# Patient Record
Sex: Female | Born: 1990
Health system: Southern US, Community
[De-identification: ages and names within clinical notes are randomized; demographics above are authoritative.]

## PROBLEM LIST (undated history)

## (undated) DIAGNOSIS — E079 Disorder of thyroid, unspecified: Secondary | ICD-10-CM

## (undated) DIAGNOSIS — D219 Benign neoplasm of connective and other soft tissue, unspecified: Secondary | ICD-10-CM

## (undated) DIAGNOSIS — E119 Type 2 diabetes mellitus without complications: Secondary | ICD-10-CM

## (undated) DIAGNOSIS — D259 Leiomyoma of uterus, unspecified: Secondary | ICD-10-CM

## (undated) DIAGNOSIS — D649 Anemia, unspecified: Secondary | ICD-10-CM

## (undated) DIAGNOSIS — E669 Obesity, unspecified: Secondary | ICD-10-CM

## (undated) DIAGNOSIS — Z87898 Personal history of other specified conditions: Secondary | ICD-10-CM

## (undated) DIAGNOSIS — E039 Hypothyroidism, unspecified: Secondary | ICD-10-CM

## (undated) DIAGNOSIS — R87629 Unspecified abnormal cytological findings in specimens from vagina: Secondary | ICD-10-CM

## (undated) DIAGNOSIS — E559 Vitamin D deficiency, unspecified: Secondary | ICD-10-CM

## (undated) DIAGNOSIS — E282 Polycystic ovarian syndrome: Secondary | ICD-10-CM

## (undated) DIAGNOSIS — J45909 Unspecified asthma, uncomplicated: Secondary | ICD-10-CM

## (undated) DIAGNOSIS — K219 Gastro-esophageal reflux disease without esophagitis: Secondary | ICD-10-CM

## (undated) DIAGNOSIS — R160 Hepatomegaly, not elsewhere classified: Secondary | ICD-10-CM

## (undated) HISTORY — DX: Benign neoplasm of connective and other soft tissue, unspecified: D21.9

## (undated) HISTORY — DX: Polycystic ovarian syndrome: E28.2

## (undated) HISTORY — PX: OTHER SURGICAL HISTORY: SHX169

## (undated) HISTORY — DX: Anemia, unspecified: D64.9

## (undated) HISTORY — DX: Unspecified abnormal cytological findings in specimens from vagina: R87.629

## (undated) HISTORY — DX: Leiomyoma of uterus, unspecified: D25.9

## (undated) HISTORY — DX: Hypothyroidism, unspecified: E03.9

## (undated) HISTORY — DX: Type 2 diabetes mellitus without complications: E11.9

## (undated) HISTORY — DX: Vitamin D deficiency, unspecified: E55.9

## (undated) HISTORY — DX: Personal history of other specified conditions: Z87.898

## (undated) HISTORY — DX: Obesity, unspecified: E66.9

## (undated) HISTORY — DX: Disorder of thyroid, unspecified: E07.9

---
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
## 2012-09-22 DIAGNOSIS — E8881 Metabolic syndrome: Secondary | ICD-10-CM | POA: Insufficient documentation

## 2012-09-22 DIAGNOSIS — E88819 Insulin resistance, unspecified: Secondary | ICD-10-CM | POA: Insufficient documentation

## 2018-02-04 ENCOUNTER — Ambulatory Visit (INDEPENDENT_AMBULATORY_CARE_PROVIDER_SITE_OTHER): Payer: Managed Care, Other (non HMO) | Admitting: Internal Medicine

## 2018-02-04 ENCOUNTER — Encounter: Payer: Self-pay | Admitting: Internal Medicine

## 2018-02-04 VITALS — BP 112/68 | HR 82 | Ht 62.5 in | Wt 303.6 lb

## 2018-02-04 DIAGNOSIS — N924 Excessive bleeding in the premenopausal period: Secondary | ICD-10-CM

## 2018-02-04 DIAGNOSIS — E039 Hypothyroidism, unspecified: Secondary | ICD-10-CM

## 2018-02-04 DIAGNOSIS — R7303 Prediabetes: Secondary | ICD-10-CM | POA: Insufficient documentation

## 2018-02-04 HISTORY — DX: Excessive bleeding in the premenopausal period: N92.4

## 2018-02-04 NOTE — Patient Instructions (Signed)
Please stop at the lab.  Stay off Levothyroxine and Metformin.  If we need to start Levothyroxine, take it every day, with water, at least 30 minutes before breakfast, separated by at least 4 hours from: - acid reflux medications - calcium - iron - multivitamins   Please come back for a follow-up appointment in 6 months.

## 2018-02-04 NOTE — Progress Notes (Signed)
Patient ID: Christine Nelson, female   DOB: Nov 18, 1990, 27 y.o.   MRN: [id number]    HPI  Christine Nelson is a 27 y.o.-year-old female, referred by her ObGyn , Dr. Anderson Malta A. Mock, for management of hypothyroidism and prediabetes.  Pt. has been dx with hypothyroidism in 2013 >> was on Levothyroxine 150 mcg >> stopped 2018.  She would like to avoid taking medications if possible.  She was taking the thyroid hormone: - fasting - fasting - separated by >30 min from b'fast  - no calcium, iron, PPIs, multivitamins   + Biotin x 1 tablet few days ago  I reviewed pt's thyroid tests: 05/03/2015: TSH 0.743, free T4 1.68 No results found for: TSH, FREET4, T3FREE  Pt describes: - fatigue - especially with her cycles  - occasional cold intolerance  But otherwise denies:  - Weight gain - depression - constipation - dry skin - hair loss  Pt denies feeling nodules in neck, hoarseness, dysphagia/odynophagia, SOB with lying down.  She has + FH of thyroid disorders in: F. No FH of thyroid cancer.  No h/o radiation tx to head or neck.  No recent use of iodine supplements.  Prediabetes:  - was on Metformin >> stopped - now exercises 5/7 days - started 2 weeks ago  Meals: - Breakfast: skips - Lunch: salad - Dinner: snack (PB crackers, grapes) or salad or wrap - Snacks: PB crackers or fruit   ROS: Constitutional: + See HPI Eyes: no blurry vision, no xerophthalmia ENT: no sore throat,  + see HPI Cardiovascular: no CP/SOB/palpitations/leg swelling Respiratory: no cough/SOB Gastrointestinal: no N/V/D/C Musculoskeletal: no muscle/joint aches Skin: no rashes Neurological: no tremors/numbness/tingling/dizziness Psychiatric: no depression/anxiety  Past Medical History:  Diagnosis Date  . Thyroid condition    Social History   Socioeconomic History  . Marital status: Single    Spouse name: Not on file  . Number of children: 0  . Years of education: Not on file  . Highest education  level: Not on file  Occupational History  . Occupation: Editor, commissioning at [employer]  . Smoking status: Never Smoker  . Smokeless tobacco: Never Used  Substance and Sexual Activity  . Alcohol use: Not on file  . Drug use: Not on file  . Sexual activity: Not on file   Current Outpatient Medications on File Prior to Visit  Medication Sig Dispense Refill  .    .    . SPRINTEC 28 0.25-35 MG-MCG tablet Take 1 tablet by mouth daily.     No current facility-administered medications on file prior to visit.     NKDA  Pertinent family history: See HPI  PE: BP 112/68   Pulse 82   Ht 5' 2.5" (1.588 m)   Wt (!) 303 lb 9.6 oz (137.7 kg)   SpO2 98%   BMI 54.64 kg/m  Wt Readings from Last 3 Encounters:  02/04/18 (!) 303 lb 9.6 oz (137.7 kg)   Constitutional: overweight, in NAD Eyes: PERRLA, EOMI, no exophthalmos ENT: moist mucous membranes, no thyromegaly, no cervical lymphadenopathy Cardiovascular: RRR, No MRG Respiratory: CTA B Gastrointestinal: abdomen soft, NT, ND, BS+ Musculoskeletal: no deformities, strength intact in all 4 Skin: moist, warm, no rashes Neurological: no tremor with outstretched hands, DTR normal in all 4  ASSESSMENT: 1. Hypothyroidism  2. Prediabetes  3. Menorrhagia  PLAN:  1. Patient with long-standing reportedly mild hypothyroidism, previously on levothyroxine therapy.  She stopped levothyroxine at some point last year and she would prefer not to  restart, if possible. - she appears euthyroid.  - she does not appear to have a goiter, thyroid nodules, or neck compression symptoms - We discussed about correct intake of levothyroxine, if we need to restart, fasting, with water, separated by at least 30 minutes from breakfast, and separated by more than 4 hours from calcium, iron, multivitamins, acid reflux medications (PPIs). - will check thyroid tests today: TSH, free T4, free T3, and will also screen her for Hashimoto's thyroiditis by taking TPO  and ATA antibodies - We discussed about Hashimoto's thyroiditis as being an autoimmune condition that eventually destroys the thyroid and renders it inactive. - If labs today are abnormal, she will need to return in ~6 weeks for repeat labs - Otherwise, I will see her back in 6 months  2.  Prediabetes - No recent HbA1c, will check one today - She started to implement 5 out of 7-day exercise with a trainer I congratulated her and strongly advised her to continue  - We also discussed about healthy diet and I made suggestions about changing her meals  - we also discussed about the reversible nature of prediabetes if she continues to make healthy changes in her diet and exercise - We do not need metformin necessarily, unless there is an upward trend in her HbA1c  3.  Menorrhagia - She describes heavy menstrual cycles with large clots - She has increased fatigue during her menses - Would want me to check her for anemia: We will check a CBC and a ferritin  Component     Latest Ref Rng & Units 02/04/2018  WBC     3.4 - 10.8 x10E3/uL 7.7  RBC     3.77 - 5.28 x10E6/uL 4.08  Hemoglobin     11.1 - 15.9 g/dL 11.3  HCT     34.0 - 46.6 % 34.7  MCV     79 - 97 fL 85  MCH     26.6 - 33.0 pg 27.7  MCHC     31.5 - 35.7 g/dL 32.6  RDW     12.3 - 15.4 % 14.6  Platelets     150 - 379 x10E3/uL 333  Hemoglobin A1C     4.8 - 5.6 % 5.5  Est. average glucose Bld gHb Est-mCnc     mg/dL 111  TSH     0.450 - 4.500 uIU/mL 6.960 (H)  T4,Free(Direct)     0.82 - 1.77 ng/dL 1.09  Triiodothyronine,Free,Serum     2.0 - 4.4 pg/mL 2.7  Ferritin     15 - 150 ng/mL 17  Thyroglobulin Antibody     0.0 - 0.9 IU/mL <1.0  Thyroperoxidase Ab SerPl-aCnc     0 - 34 IU/mL 10   CBC normal, but ferritin at the low end of normal.  Would suggest to start iron once a day for breakfast. Thyroid antibodies not elevated.  No signs of Hashimoto's thyroiditis. TSH slightly high with normal free thyroid hormones.  Based  on her desire about not starting levothyroxine unless absolutely necessary and based on the guidelines (need to restart if overt hypothyroidism, otherwise, only if desires pregnancy, if TSH is higher than 10, or if clear signs and symptoms re indicative of hypothyroidism).  We will recheck her TFTs in 6 months.  Philemon Kingdom, MD PhD [facility] Endocrinology

## 2018-02-05 LAB — CBC
HEMOGLOBIN: 11.3 g/dL (ref 11.1–15.9)
Hematocrit: 34.7 % (ref 34.0–46.6)
MCH: 27.7 pg (ref 26.6–33.0)
MCHC: 32.6 g/dL (ref 31.5–35.7)
MCV: 85 fL (ref 79–97)
Platelets: 333 10*3/uL (ref 150–379)
RBC: 4.08 x10E6/uL (ref 3.77–5.28)
RDW: 14.6 % (ref 12.3–15.4)
WBC: 7.7 10*3/uL (ref 3.4–10.8)

## 2018-02-05 LAB — THYROID PEROXIDASE ANTIBODY: THYROID PEROXIDASE ANTIBODY: 10 [IU]/mL (ref 0–34)

## 2018-02-05 LAB — FERRITIN: Ferritin: 17 ng/mL (ref 15–150)

## 2018-02-05 LAB — TSH: TSH: 6.96 u[IU]/mL — ABNORMAL HIGH (ref 0.450–4.500)

## 2018-02-05 LAB — T3, FREE: T3, Free: 2.7 pg/mL (ref 2.0–4.4)

## 2018-02-05 LAB — T4, FREE: Free T4: 1.09 ng/dL (ref 0.82–1.77)

## 2018-02-05 LAB — HEMOGLOBIN A1C
Est. average glucose Bld gHb Est-mCnc: 111 mg/dL
Hgb A1c MFr Bld: 5.5 % (ref 4.8–5.6)

## 2018-02-05 LAB — THYROGLOBULIN ANTIBODY

## 2018-02-08 ENCOUNTER — Telehealth: Payer: Self-pay

## 2018-02-08 NOTE — Telephone Encounter (Signed)
-----   Message from Philemon Kingdom, MD sent at 02/08/2018  9:03 AM EDT ----- Larey Seat, can you please call pt:  CBC normal (no anemia), but ferritin at the low end of normal.  Would suggest to start an iron supplement once a day for breakfast, although not mandatory if she increases her intake of green leafy vegetables.  If she does start iron, this is over-the-counter. Thyroid antibodies not elevated, therefore no signs of Hashimoto's thyroiditis. TSH is slightly high but her free thyroid hormones are normal.  We do not absolutely need to start levothyroxine for now.  We will recheck her TFTs in 6 months.

## 2018-08-08 ENCOUNTER — Ambulatory Visit: Payer: Managed Care, Other (non HMO) | Admitting: Internal Medicine

## 2018-08-08 DIAGNOSIS — Z0289 Encounter for other administrative examinations: Secondary | ICD-10-CM

## 2019-03-08 ENCOUNTER — Ambulatory Visit: Payer: Self-pay | Admitting: Family Medicine

## 2019-03-15 ENCOUNTER — Encounter: Payer: Self-pay | Admitting: Family Medicine

## 2019-03-15 ENCOUNTER — Other Ambulatory Visit: Payer: Self-pay

## 2019-03-15 ENCOUNTER — Ambulatory Visit: Payer: BC Managed Care – PPO | Admitting: Family Medicine

## 2019-03-15 VITALS — BP 110/64 | HR 86 | Temp 97.8°F | Wt 319.0 lb

## 2019-03-15 DIAGNOSIS — N926 Irregular menstruation, unspecified: Secondary | ICD-10-CM | POA: Diagnosis not present

## 2019-03-15 DIAGNOSIS — R5383 Other fatigue: Secondary | ICD-10-CM | POA: Diagnosis not present

## 2019-03-15 DIAGNOSIS — Z8639 Personal history of other endocrine, nutritional and metabolic disease: Secondary | ICD-10-CM | POA: Insufficient documentation

## 2019-03-15 DIAGNOSIS — Z7689 Persons encountering health services in other specified circumstances: Secondary | ICD-10-CM | POA: Diagnosis not present

## 2019-03-15 DIAGNOSIS — Z6841 Body Mass Index (BMI) 40.0 and over, adult: Secondary | ICD-10-CM

## 2019-03-15 LAB — CBC WITH DIFFERENTIAL/PLATELET
Basophils Absolute: 0 10*3/uL (ref 0.0–0.1)
Basophils Relative: 0.3 % (ref 0.0–3.0)
Eosinophils Absolute: 0.1 10*3/uL (ref 0.0–0.7)
Eosinophils Relative: 1.2 % (ref 0.0–5.0)
HCT: 36 % (ref 36.0–46.0)
Hemoglobin: 11.5 g/dL — ABNORMAL LOW (ref 12.0–15.0)
Lymphocytes Relative: 30.9 % (ref 12.0–46.0)
Lymphs Abs: 2.6 10*3/uL (ref 0.7–4.0)
MCHC: 31.9 g/dL (ref 30.0–36.0)
MCV: 83.1 fl (ref 78.0–100.0)
Monocytes Absolute: 0.3 10*3/uL (ref 0.1–1.0)
Monocytes Relative: 3.6 % (ref 3.0–12.0)
Neutro Abs: 5.5 10*3/uL (ref 1.4–7.7)
Neutrophils Relative %: 64 % (ref 43.0–77.0)
Platelets: 358 10*3/uL (ref 150.0–400.0)
RBC: 4.34 Mil/uL (ref 3.87–5.11)
RDW: 16.2 % — ABNORMAL HIGH (ref 11.5–15.5)
WBC: 8.5 10*3/uL (ref 4.0–10.5)

## 2019-03-15 LAB — BASIC METABOLIC PANEL
BUN: 9 mg/dL (ref 6–23)
CO2: 28 mEq/L (ref 19–32)
Calcium: 9 mg/dL (ref 8.4–10.5)
Chloride: 102 mEq/L (ref 96–112)
Creatinine, Ser: 0.86 mg/dL (ref 0.40–1.20)
GFR: 96.71 mL/min (ref >60.00)
Glucose, Bld: 90 mg/dL (ref 70–99)
Potassium: 4.4 mEq/L (ref 3.5–5.1)
Sodium: 138 mEq/L (ref 135–145)

## 2019-03-15 LAB — T4, FREE: Free T4: 0.69 ng/dL (ref 0.60–1.60)

## 2019-03-15 LAB — HEMOGLOBIN A1C: Hgb A1c MFr Bld: 6.8 % — ABNORMAL HIGH (ref 4.6–6.5)

## 2019-03-15 LAB — VITAMIN D 25 HYDROXY (VIT D DEFICIENCY, FRACTURES): VITD: 20.62 ng/mL — ABNORMAL LOW (ref 30.00–100.00)

## 2019-03-15 LAB — TSH: TSH: 13.68 u[IU]/mL — ABNORMAL HIGH (ref 0.35–4.50)

## 2019-03-15 LAB — POCT URINE PREGNANCY: Preg Test, Ur: NEGATIVE

## 2019-03-15 NOTE — Progress Notes (Signed)
Patient presents to clinic today to establish care.  SUBJECTIVE: PMH:  Pt is a 28 yo female with pmh sig for h/o hypothyroidism and obesity.  Pt previously seen in [place], [place].  Seen by OB/Gyn in April 2020.  H/o thyroid issues: -in the past pt seen by Endocrinology -told had hypothyroidism, put on levothyroxine and metformin -has since seen another Endocrinologist and taken off meds as "levels were normal". -pt denies constipation, diarrhea, palpitations, hair loss -endorses weight gain, fatigue/not feeling rested  Irregular menses: -pt typically has regular menses -on Sprintec OCPs -states last menses was shorter in duration, 3 days as opposed to 5 days -pt mentioned this to her OB/Gyn.  Advised to take a home pregnancy test, which was negative. -pt endorses being sexually active in April. -pt does not think she is pregnant, but wants a test today to be sure. -endorses feet and ankle swelling near her menses.  Weight gain -not exercising.   -may eat one meal per day. -trying to drink more water -endorses her dad is overweight.  Allergies:  NKDA  Past Surgical hx: none  Social hx: Pt is single.  She has a boyfriend.  Pt graduated college with a degree in Biology.  Pt has always wanted to become a physician.  Pt was teaching Biology.  She is now working part-time at [employer].  Pt denies EtOH, tobacco, and drug use.  Health Maintenance: PAP -- April 2020   Family medical hx: Mom-htn Dad-obesity MGF-stroke, HTN Brother- AAW Brother- AAW  Past Medical History:  Diagnosis Date  . Thyroid condition     History reviewed. No pertinent surgical history.  Current Outpatient Medications on File Prior to Visit  Medication Sig Dispense Refill  . SPRINTEC 28 0.25-35 MG-MCG tablet Take 1 tablet by mouth daily.    . metFORMIN (GLUCOPHAGE-XR) 500 MG 24 hr tablet Take 1 tablet by mouth daily with breakfast.     No current facility-administered medications on  file prior to visit.     No Known Allergies  Family History  Problem Relation Age of Onset  . Hypertension Mother     Social History   Socioeconomic History  . Marital status: Single    Spouse name: Not on file  . Number of children: Not on file  . Years of education: Not on file  . Highest education level: Not on file  Occupational History  . Occupation: Editor, commissioning  Social Needs  . Financial resource strain: Not on file  . Food insecurity:    Worry: Not on file    Inability: Not on file  . Transportation needs:    Medical: Not on file    Non-medical: Not on file  Tobacco Use  . Smoking status: Never Smoker  . Smokeless tobacco: Never Used  Substance and Sexual Activity  . Alcohol use: Never    Frequency: Never  . Drug use: Never  . Sexual activity: Yes  Lifestyle  . Physical activity:    Days per week: Not on file    Minutes per session: Not on file  . Stress: Not on file  Relationships  . Social connections:    Talks on phone: Not on file    Gets together: Not on file    Attends religious service: Not on file    Active member of club or organization: Not on file    Attends meetings of clubs or organizations: Not on file    Relationship status: Not on file  .  Intimate partner violence:    Fear of current or ex partner: Not on file    Emotionally abused: Not on file    Physically abused: Not on file    Forced sexual activity: Not on file  Other Topics Concern  . Not on file  Social History Narrative  . Not on file    ROS General: Denies fever, chills, night sweats, changes in weight, changes in appetite  +fatigue, wt gain HEENT: Denies headaches, ear pain, changes in vision, rhinorrhea, sore throat CV: Denies CP, palpitations, SOB, orthopnea Pulm: Denies SOB, cough, wheezing GI: Denies abdominal pain, nausea, vomiting, diarrhea, constipation GU: Denies dysuria, hematuria, frequency, vaginal discharge  +change in menses Msk: Denies muscle cramps,  joint pains Neuro: Denies weakness, numbness, tingling Skin: Denies rashes, bruising Psych: Denies depression, anxiety, hallucinations   BP 110/64   Pulse 86   Temp 97.8 F (36.6 C) (Oral)   Wt (!) 319 lb (144.7 kg)   LMP 02/27/2019 (Exact Date)   SpO2 98%   BMI 57.42 kg/m   Physical Exam Gen. Pleasant, well developed, obese, well-nourished, in NAD HEENT - [place]/AT, PERRL, no scleral icterus, no nasal drainage, pharynx without erythema or exudate. Neck: No JVD, no thyromegaly, no carotid bruits Lungs: no use of accessory muscles, CTAB, no wheezes, rales or rhonchi Cardiovascular: heart sounds distant 2/2 body habitus, RRR, No r/g/m, no peripheral edema Abdomen: BS present, soft, nontender,nondistended Neuro:  A&Ox3, CN II-XII intact, normal gait Skin:  Warm, dry, intact, no lesions.  Acanthosis nigricans   No results found for this or any previous visit (from the past 2160 hour(s)).  Assessment/Plan: History of hypothyroidism  -not currently on meds - Plan: TSH, T4, Free  Fatigue, unspecified type  - Plan: CBC with Differential/Platelet, Basic metabolic panel, Hemoglobin A1c, Vitamin D, 25-hydroxy  Encounter to establish care -We reviewed the PMH, PSH, FH, SH, Meds and Allergies. -We provided refills for any medications we will prescribe as needed. -We addressed current concerns per orders and patient instructions. -We have asked for records for pertinent exams, studies, vaccines and notes from previous providers. -We have advised patient to follow up per instructions below.  Menses, irregular  -menses occurred but shorter in duration -continue OCPs -continue f/u with OB/Gyn - Plan: POCT urine pregnancy  Class III severe obesity due to excess calories without serious comorbidity with BMI 50-59.9 -BMI 53.4 -Discussed weight loss -Patient to increase physical activity, eat several small meals per day -Given handout -We will check TSH  F/u prn  Grier Mitts, MD

## 2019-03-15 NOTE — Patient Instructions (Signed)
Preventing Unhealthy Weight Gain, Adult Staying at a healthy weight is important to your overall health. When fat builds up in your body, you may become overweight or obese. Being overweight or obese increases your risk of developing certain health problems, such as heart disease, diabetes, sleeping problems, joint problems, and some types of cancer. Unhealthy weight gain is often the result of making unhealthy food choices or not getting enough exercise. You can make changes to your lifestyle to prevent obesity and stay as healthy as possible. What nutrition changes can be made?   Eat only as much as your body needs. To do this: ? Pay attention to signs that you are hungry or full. Stop eating as soon as you feel full. ? If you feel hungry, try drinking water first before eating. Drink enough water so your urine is clear or pale yellow. ? Eat smaller portions. Pay attention to portion sizes when eating out. ? Look at serving sizes on food labels. Most foods contain more than one serving per container. ? Eat the recommended number of calories for your gender and activity level. For most active people, a daily total of 2,000 calories is appropriate. If you are trying to lose weight or are not very active, you may need to eat fewer calories. Talk with your health care provider or a diet and nutrition specialist (dietitian) about how many calories you need each day.  Choose healthy foods, such as: ? Fruits and vegetables. At each meal, try to fill at least half of your plate with fruits and vegetables. ? Whole grains, such as whole-wheat bread, brown rice, and quinoa. ? Lean meats, such as chicken or fish. ? Other healthy proteins, such as beans, eggs, or tofu. ? Healthy fats, such as nuts, seeds, fatty fish, and olive oil. ? Low-fat or fat-free dairy products.  Check food labels, and avoid food and drinks that: ? Are high in calories. ? Have added sugar. ? Are high in sodium. ? Have saturated  fats or trans fats.  Cook foods in healthier ways, such as by baking, broiling, or grilling.  Make a meal plan for the week, and shop with a grocery list to help you stay on track with your purchases. Try to avoid going to the grocery store when you are hungry.  When grocery shopping, try to shop around the outside of the store first, where the fresh foods are. Doing this helps you to avoid prepackaged foods, which can be high in sugar, salt (sodium), and fat. What lifestyle changes can be made?   Exercise for 30 or more minutes on 5 or more days each week. Exercising may include brisk walking, yard work, biking, running, swimming, and team sports like basketball and soccer. Ask your health care provider which exercises are safe for you.  Do muscle-strengthening activities, such as lifting weights or using resistance bands, on 2 or more days a week.  Do not use any products that contain nicotine or tobacco, such as cigarettes and e-cigarettes. If you need help quitting, ask your health care provider.  Limit alcohol intake to no more than 1 drink a day for nonpregnant women and 2 drinks a day for men. One drink equals 12 oz of beer, 5 oz of wine, or 1 oz of hard liquor.  Try to get 7-9 hours of sleep each night. What other changes can be made?  Keep a food and activity journal to keep track of: ? What you ate and how many calories  you had. Remember to count the calories in sauces, dressings, and side dishes. ? Whether you were active, and what exercises you did. ? Your calorie, weight, and activity goals.  Check your weight regularly. Track any changes. If you notice you have gained weight, make changes to your diet or activity routine.  Avoid taking weight-loss medicines or supplements. Talk to your health care provider before starting any new medicine or supplement.  Talk to your health care provider before trying any new diet or exercise plan. Why are these changes important?  Eating healthy, staying active, and having healthy habits can help you to prevent obesity. Those changes also:  Help you manage stress and emotions.  Help you connect with friends and family.  Improve your self-esteem.  Improve your sleep.  Prevent long-term health problems. What can happen if changes are not made? Being obese or overweight can cause you to develop joint or bone problems, which can make it hard for you to stay active or do activities you enjoy. Being obese or overweight also puts stress on your heart and lungs and can lead to health problems like diabetes, heart disease, and some cancers. Where to find more information Talk with your health care provider or a dietitian about healthy eating and healthy lifestyle choices. You may also find information from:  [place]. Department of Agriculture, MyPlate: FormerBoss.no  American Heart Association: www.heart.org  Centers for Disease Control and Prevention: http://www.wolf.info/ Summary  Staying at a healthy weight is important to your overall health. It helps you to prevent certain diseases and health problems, such as heart disease, diabetes, joint problems, sleep disorders, and some types of cancer.  Being obese or overweight can cause you to develop joint or bone problems, which can make it hard for you to stay active or do activities you enjoy.  You can prevent unhealthy weight gain by eating a healthy diet, exercising regularly, not smoking, limiting alcohol, and getting enough sleep.  Talk with your health care provider or a dietitian for guidance about healthy eating and healthy lifestyle choices. This information is not intended to replace advice given to you by your health care provider. Make sure you discuss any questions you have with your health care provider. Document Released: 09/22/2016 Document Revised: 07/02/2017 Document Reviewed: 10/28/2016 Elsevier Interactive Patient Education  2019 [place] is a normal reaction to life events. Stress is what you feel when life demands more than you are used to, or more than you think you can handle. Some stress can be useful, such as studying for a test or meeting a deadline at work. Stress that occurs too often or for too long can cause problems. It can affect your emotional health and interfere with relationships and normal daily activities. Too much stress can weaken your body's defense system (immune system) and increase your risk for physical illness. If you already have a medical problem, stress can make it worse. What are the causes? All sorts of life events can cause stress. An event that causes stress for one person may not be stressful for another person. Major life events, whether positive or negative, commonly cause stress. Examples include:  Losing a job or starting a new job.  Losing a loved one.  Moving to a new town or home.  Getting married or divorced.  Having a baby.  Injury or illness. Less obvious life events can also cause stress, especially if they occur day after day or in combination with each  other. Examples include:  Working long hours.  Driving in traffic.  Caring for children.  Being in debt.  Being in a difficult relationship. What are the signs or symptoms? Stress can cause emotional symptoms, including:  Anxiety. This is feeling worried, afraid, on edge, overwhelmed, or out of control.  Anger, including irritation or impatience.  Depression. This is feeling sad, down, helpless, or guilty.  Trouble focusing, remembering, or making decisions. Stress can cause physical symptoms, including:  Aches and pains. These may affect your head, neck, back, stomach, or other areas of your body.  Tight muscles or a clenched jaw.  Low energy.  Trouble sleeping. Stress can cause unhealthy behaviors, including:  Eating to feel better (overeating) or skipping meals.  Working too much or putting off  tasks.  Smoking, drinking alcohol, or using drugs to feel better. How is this diagnosed? Stress is diagnosed through an assessment by your health care provider. He or she may diagnose this condition based on:  Your symptoms and any stressful life events.  Your medical history.  Tests to rule out other causes of your symptoms. Depending on your condition, your health care provider may refer you to a specialist for further evaluation. How is this treated?  Stress management techniques are the recommended treatment for stress. Medicine is not typically recommended for the treatment of stress. Techniques to reduce your reaction to stressful life events include:  Stress identification. Monitor yourself for symptoms of stress and identify what causes stress for you. These skills may help you to avoid or prepare for stressful events.  Time management. Set your priorities, keep a calendar of events, and learn to say "no." Taking these actions can help you avoid making too many commitments. Techniques for coping with stress include:  Rethinking the problem. Try to think realistically about stressful events rather than ignoring them or overreacting. Try to find the positives in a stressful situation rather than focusing on the negatives.  Exercise. Physical exercise can release both physical and emotional tension. The key is to find a form of exercise that you enjoy and do it regularly.  Relaxation techniques. These relax the body and mind. The key is to find one or more that you enjoy and use the technique(s) regularly. Examples include: ? Meditation, deep breathing, or progressive relaxation techniques. ? Yoga or tai chi. ? Biofeedback, mindfulness techniques, or journaling. ? Listening to music, being out in nature, or participating in other hobbies.  Practicing a healthy lifestyle. Eat a balanced diet, drink plenty of water, limit or avoid caffeine, and get plenty of sleep.  Having a  strong support network. Spend time with family, friends, or other people you enjoy being around. Express your feelings and talk things over with someone you trust. Counseling or talk therapy with a mental health professional may be helpful if you are having trouble managing stress on your own. Follow these instructions at home: Lifestyle   Avoid drugs.  Do not use any products that contain nicotine or tobacco, such as cigarettes and e-cigarettes. If you need help quitting, ask your health care provider.  Limit alcohol intake to no more than 1 drink a day for nonpregnant women and 2 drinks a day for men. One drink equals 12 oz of beer, 5 oz of wine, or 1 oz of hard liquor.  Do not use alcohol or drugs to relax.  Eat a balanced diet that includes fresh fruits and vegetables, whole grains, lean meats, fish, eggs, and beans, and low-fat  dairy. Avoid processed foods and foods high in added fat, sugar, and salt.  Exercise at least 30 minutes on 5 or more days each week.  Get 7-8 hours of sleep each night. General instructions   Practice stress management techniques as discussed with your health care provider.  Drink enough fluid to keep your urine clear or pale yellow.  Take over-the-counter and prescription medicines only as told by your health care provider.  Keep all follow-up visits as told by your health care provider. This is important. Contact a health care provider if:  Your symptoms get worse.  You have new symptoms.  You feel overwhelmed by your problems and can no longer manage them on your own. Get help right away if:  You have thoughts of hurting yourself or others. If you ever feel like you may hurt yourself or others, or have thoughts about taking your own life, get help right away. You can go to your nearest emergency department or call:  Your local emergency services (911 in the [place]  A suicide crisis helpline, such as the [facility]  at 989-233-8694. This is open 24 hours a day. Summary  Stress is a normal reaction to life events. It can cause problems if it happens too often or for too long.  Practicing stress management techniques is the best way to treat stress.  Counseling or talk therapy with a mental health professional may be helpful if you are having trouble managing stress on your own. This information is not intended to replace advice given to you by your health care provider. Make sure you discuss any questions you have with your health care provider. Document Released: 03/17/2001 Document Revised: 11/11/2016 Document Reviewed: 11/11/2016 Elsevier Interactive Patient Education  2019 [employer].

## 2019-03-17 ENCOUNTER — Other Ambulatory Visit: Payer: Self-pay | Admitting: Family Medicine

## 2019-03-17 DIAGNOSIS — E559 Vitamin D deficiency, unspecified: Secondary | ICD-10-CM

## 2019-03-17 MED ORDER — VITAMIN D (ERGOCALCIFEROL) 1.25 MG (50000 UNIT) PO CAPS: 50000.0000 [IU] | ORAL_CAPSULE | ORAL | 0 refills | Status: DC

## 2019-03-23 ENCOUNTER — Telehealth: Payer: Self-pay

## 2019-03-23 NOTE — Telephone Encounter (Signed)
Called pt left a VM to call office for her lab results    Copied from [place] [id number]. Topic: Quick Communication - Lab Results (Clinic Use ONLY) >> Mar 20, 2019  1:02 PM Lennox Solders wrote: Pt is calling and would like blood work results

## 2019-04-21 DIAGNOSIS — E282 Polycystic ovarian syndrome: Secondary | ICD-10-CM | POA: Insufficient documentation

## 2019-05-31 ENCOUNTER — Other Ambulatory Visit: Payer: Self-pay | Admitting: Obstetrics and Gynecology

## 2019-05-31 DIAGNOSIS — D259 Leiomyoma of uterus, unspecified: Secondary | ICD-10-CM

## 2019-06-07 ENCOUNTER — Other Ambulatory Visit: Payer: Self-pay | Admitting: Family Medicine

## 2019-06-07 DIAGNOSIS — E559 Vitamin D deficiency, unspecified: Secondary | ICD-10-CM

## 2019-06-15 ENCOUNTER — Ambulatory Visit: Payer: BC Managed Care – PPO | Admitting: Registered"

## 2019-06-27 ENCOUNTER — Other Ambulatory Visit: Payer: BC Managed Care – PPO

## 2019-07-27 ENCOUNTER — Other Ambulatory Visit: Payer: Self-pay

## 2019-07-27 ENCOUNTER — Inpatient Hospital Stay: Admission: RE | Admit: 2019-07-27 | Payer: Self-pay | Source: Ambulatory Visit

## 2019-08-19 ENCOUNTER — Other Ambulatory Visit: Payer: Self-pay

## 2019-08-19 ENCOUNTER — Ambulatory Visit
Admission: RE | Admit: 2019-08-19 | Discharge: 2019-08-19 | Disposition: A | Discharge status: Home / Self Care | Payer: Managed Care, Other (non HMO) | Source: Ambulatory Visit | Attending: Obstetrics and Gynecology | Admitting: Obstetrics and Gynecology

## 2019-08-19 DIAGNOSIS — D259 Leiomyoma of uterus, unspecified: Secondary | ICD-10-CM

## 2019-08-19 MED ORDER — GADOBENATE DIMEGLUMINE 529 MG/ML IV SOLN
20.0000 mL | Freq: Once | INTRAVENOUS | Status: AC | PRN
  Administered 2019-08-19: 20 mL via INTRAVENOUS

## 2019-08-24 ENCOUNTER — Telehealth: Payer: Self-pay | Admitting: *Deleted

## 2019-08-24 ENCOUNTER — Other Ambulatory Visit: Payer: Self-pay

## 2019-08-24 ENCOUNTER — Encounter: Payer: Self-pay | Admitting: Gynecologic Oncology

## 2019-08-24 NOTE — Telephone Encounter (Signed)
Patient called back and scheduled an appt for tomorrow

## 2019-08-24 NOTE — Progress Notes (Signed)
GYNECOLOGIC ONCOLOGY NEW PATIENT CONSULTATION   Patient Name: Christine Nelson  Patient Age: 28 y.o. Date of Service: 08/25/19 Referring Provider: Billie Ruddy, [address] [place],  [place] [address]   Primary Care Provider: Billie Ruddy, MD Consulting Provider: Jeral Pinch, MD   Assessment/Plan:  28 year old G0 with a large intrauterine mass suspected to be a degenerating fibroid.  We reviewed her recent MRI findings and looked at the pictures together.  Based on our radiologist review as well as my own, the mass appears to be arising from with in the lining of the uterus or submucosa.  My suspicion is that this is a degenerating fibroid although we discussed the limitation of imaging in the sense that we cannot rule out malignancy definitively.  The patient is at increased risk for uterine cancer given her body habitus and unopposed estrogen.  I commended her on her weight loss to date and encouraged her to continue her healthy eating habits and exercise.  This will make a significant difference in her risk for endometrial cancer in the future.  We discussed the common ways that uterine cancer presents and reviewed that in premenopausal women, patients may develop abnormal bleeding or intermenstrual bleeding.  Given the improvement of her intermenstrual bleeding with her reinitiation of thyroid replacement, I suspect that this will continue to improve and ultimately stop in the near future.  The patient very strongly desires fertility and is interested in a myomectomy.  Given the size of the fibroid, I suspect that her primary gynecologist was planning open versus robotic myomectomy.  We reviewed the increased surgical risk with her weight and that I suspect the recommendation will be to delay surgery for a number of months until further weight loss is achieved.    In the interim, I would suggest 1 of 2 strategies.  I would either pursue biopsy of the intrauterine  mass as well as lining of the uterus with a hysteroscopic procedure or a repeat MRI in 3-4 months.  We discussed the difficulty of comparing transvaginal ultrasound results to an MRI.  It also may be that the fibroid has changed in size secondary to its degeneration.  We discussed endometrial biopsy today however I think that given the location of the intrauterine mass, endometrial biopsy in clinic will be very difficult if not impossible.  The patient is scheduled for follow-up with Dr. Royston Sinner in mid November as well as to meet with a nutritionist.  A copy of this note was sent to the patient's referring provider.   Jeral Pinch, MD  Division of Gynecologic Oncology  Department of Obstetrics and Gynecology  [facility]  ___________________________________________  Chief Complaint: Chief Complaint  Patient presents with  . Uterine leiomyoma, unspecified location    History of Present Illness:  Christine Nelson is a 28 y.o. y.o. female who is seen in consultation at the request of Billie Ruddy, MD for an evaluation of a uterine mass.  Patient reports menarche at age 33 with a history of irregular bleeding.  She has been previously diagnosed with polycystic ovarian syndrome.  Her medical history is also notable for hypothyroidism.  She was recently off thyroid replacement, and began having some intermenstrual bleeding and spotting.  Since she restarted her levothyroxine, she has had significant decrease in intermenstrual bleeding.  On OCPs, she has monthly menses now.  She notes bad cramping with pain on the left side of her abdomen with her menses which  she remembers started in December last year.  Otherwise, she denies any abdominal or pelvic pain.  She denies nausea or vomiting.  She reports having a good appetite although has been working on weight loss recently.  She has lost about 10 pounds so far and is looking forward to her meeting next month with the  nutritionist.  She reports normal bowel and bladder function.  Earlier this year, given desire to become pregnant, she saw a gynecologist.  At that time she had a office ultrasound in July showing a 7 cm.  More recently, she had an MRI done showing an enlarged uterus measuring 17 cm with a 10 x 7 x 8 cm degenerating mass likely within the endometrial cavity.  PAST MEDICAL HISTORY:  Past Medical History:  Diagnosis Date  . Diabetes mellitus, type II ([place])    Hgb A1c in 03/2019 was 6.8%  . Hypothyroid   . Obesity   . Uterine leiomyoma      PAST SURGICAL HISTORY:  History reviewed. No pertinent surgical history.  OB/GYN HISTORY:  OB History  Gravida Para Term Preterm AB Living  0 0 0 0 0 0  SAB TAB Ectopic Multiple Live Births  0 0 0 0 0   OCP use, currently sexually active Menarche at 28y.o. Hx of STDs: HPV Last pap: 01/2019 History of abnormal pap smears: yes, LSIL pap 04/2016, HPV+; underwent colposcopy with poor Lugol's uptake at 12 and 6:00.  Biopsies taken from each location and an ECC was performed.  Biopsy showed CIN-1 and ECC was negative for dysplasia or malignancy. Pap tests have been normal since.  SCREENING STUDIES:  Last mammogram: n/a  Last colonoscopy: n/a Last bone mineral density: n/a  MEDICATIONS: Outpatient Encounter Medications as of 08/25/2019  Medication Sig  . ELDERBERRY PO Take 1 tablet by mouth daily.  Marland Kitchen levothyroxine (SYNTHROID) 25 MCG tablet levothyroxine 25 mcg tablet  TAKE 1 TABLET BY MOUTH EVERY DAY  . Multiple Vitamin (MULTIVITAMIN) tablet Take 1 tablet by mouth daily.  . SPRINTEC 28 0.25-35 MG-MCG tablet Take 1 tablet by mouth daily.  . vitamin C (ASCORBIC ACID) 500 MG tablet Take 500 mg by mouth daily.  . [DISCONTINUED] albuterol (PROAIR HFA) 108 (90 Base) MCG/ACT inhaler ProAir HFA 90 mcg/actuation aerosol inhaler  INHALE 1 TO 2 PUFFS EVERY 4 TO 6 HOURS AS NEEDED  . [DISCONTINUED] fluticasone (FLONASE) 50 MCG/ACT nasal spray fluticasone  propionate 50 mcg/actuation nasal spray,suspension  SPRAY 2 SPRAYS INTO EACH NOSTRIL EVERY DAY  . [DISCONTINUED] metFORMIN (GLUCOPHAGE-XR) 500 MG 24 hr tablet Take 1 tablet by mouth daily with breakfast.  . [DISCONTINUED] Vitamin D, Ergocalciferol, (DRISDOL) 1.25 MG (50000 UT) CAPS capsule Take 1 capsule (50,000 Units total) by mouth every 7 (seven) days.   No facility-administered encounter medications on file as of 08/25/2019.     ALLERGIES:  No Known Allergies   FAMILY HISTORY:  Family History  Problem Relation Age of Onset  . Hypertension Mother   . Thyroid disease Father   . Hypertension Maternal Grandfather   . Stroke Maternal Grandfather      Relationships  Social connections  . Talks on phone: Not on file  . Gets together: Not on file  . Attends religious service: Not on file  . Active member of club or organization: Not on file  . Attends meetings of clubs or organizations: Not on file  . Relationship status: Not on file    REVIEW OF SYSTEMS:  Denies appetite changes, fevers, chills,  fatigue, unexplained weight changes. Denies hearing loss, neck lumps or masses, mouth sores, ringing in ears or voice changes. Denies cough or wheezing. Denies chest pain or palpitations. Denies leg swelling. Denies abdominal distention, pain, blood in stools, constipation, diarrhea, nausea, vomiting, or early satiety. Denies pain with intercourse, dysuria, frequency, hematuria or incontinence. Denies hot flashes, pelvic pain, vaginal bleeding or vaginal discharge.  Denies joint pain, back pain or muscle pain/cramps. Denies itching, rash, or wounds. Denies dizziness, headaches, numbness or seizures. Denies swollen lymph nodes or glands, denies easy bruising or bleeding. Denies anxiety, depression, confusion, or decreased concentration.  Physical Exam:  Vital Signs for this encounter:  Blood pressure 128/72, pulse 88, temperature 98.7 F (37.1 C), temperature source Temporal, resp.  rate 20, height 5\' 3"  (1.6 m), weight (!) 315 lb (142.9 kg), SpO2 100 %. Body mass index is 55.8 kg/m. General: Alert, oriented, no acute distress.  HEENT: Normocephalic, atraumatic. Sclera anicteric.  Chest: Clear to auscultation bilaterally.  Breath sounds distant.  No wheezes or rhonchi. Cardiovascular: Regular rate and rhythm, no murmurs, rubs, or gallops.  Abdomen: Obese. Normoactive bowel sounds. Soft, nondistended, nontender to palpation. No masses or hepatosplenomegaly appreciated. No palpable fluid wave.  Extremities: Grossly normal range of motion. Warm, well perfused. No edema bilaterally.  Skin: No rashes or lesions.  Lymphatics: No cervical, supraclavicular, or inguinal adenopathy.  GU:  Normal external female genitalia.  No lesions. No discharge or bleeding.             Bladder/urethra:  No lesions or masses, well supported bladder             Vagina: Rugated vaginal mucosa, no lesions.             Cervix: Normal appearing, no lesions.  Somewhat posterior facing.             Uterus: 15 cm and somewhat mobile.  Fundus palpated between pubic symphysis and umbilicus, although somewhat difficult due to body habitus.             Adnexa: no masses appreciated.  Rectal: Deferred.  LABORATORY AND RADIOLOGIC DATA:  Outside medical records were reviewed to synthesize the above history, along with the history and physical obtained during the visit.   MRI pelvis 08/19/19: IMPRESSION: 1. Large ovoid mass in the uterus with appearances signal characteristics that likely represent a large degenerating leiomyoma. However, the endometrium cannot be identified and on some images this appears to be centered within the endometrial canal. Hysteroscopic assessment may be helpful to exclude an endometrial neoplasm. Comparison with previous imaging would also be useful. 2. Normal appendix. 3. No pelvic lymphadenopathy. 4. No signs of ureteral dilation. 5. Mildly enlarged inguinal nodes, likely  reactive.

## 2019-08-25 ENCOUNTER — Other Ambulatory Visit: Payer: Self-pay

## 2019-08-25 ENCOUNTER — Inpatient Hospital Stay: Payer: Managed Care, Other (non HMO) | Attending: Gynecologic Oncology | Admitting: Gynecologic Oncology

## 2019-08-25 ENCOUNTER — Encounter: Payer: Self-pay | Admitting: Gynecologic Oncology

## 2019-08-25 VITALS — BP 128/72 | HR 88 | Temp 98.7°F | Resp 20 | Ht 63.0 in | Wt 315.0 lb

## 2019-08-25 DIAGNOSIS — D259 Leiomyoma of uterus, unspecified: Secondary | ICD-10-CM | POA: Insufficient documentation

## 2019-08-25 NOTE — Patient Instructions (Addendum)
It was a pleasure meeting you today.  I will send my recommendations to Dr. Royston Sinner.  Congratulations on your weight loss to date!  We discussed continued weight loss today to make a procedure to remove the uterine fibroid (myomectomy) more feasible and safe for you.  We also discussed her increased risk factors for endometrial cancer, notably your weight.  I suspect that your intermenstrual bleeding is related to your thyroid disease.  You have already noticed some improvement in intermenstrual bleeding since restarting her levothyroxine.    I recommend 1 of 2 options for surveillance until you are ready to undergo myomectomy.  Either, I would recommend repeat MRI imaging (so we can compare the same imaging modality), at least 3 to 4 months after the one you just had: Or, doing outpatient surgery to biopsy the uterine mass as well as your endometrial or uterine lining.  Overall though, my suspicion that this is a cancer (malignant) is very low.  If you have any questions or need anything in the future, please do not hesitate to call our office at [phone].

## 2019-08-25 NOTE — Telephone Encounter (Signed)
error 

## 2019-09-14 ENCOUNTER — Telehealth: Payer: Managed Care, Other (non HMO) | Admitting: Family Medicine

## 2019-09-15 ENCOUNTER — Telehealth: Payer: Self-pay | Admitting: *Deleted

## 2019-09-15 NOTE — Telephone Encounter (Signed)
Copied from [place] [id number]. Topic: General - Inquiry >> Sep 14, 2019  5:53 PM [name] wrote: Reason for CRM: Patient called in she was suppose to have a telephone appt today with Dr Volanda Napoleon and the call never came through . Please advise

## 2019-09-15 NOTE — Telephone Encounter (Signed)
Pt has been scheduled for 09/18/2019 at 10.30 am with Dr Volanda Napoleon

## 2019-09-18 ENCOUNTER — Telehealth (INDEPENDENT_AMBULATORY_CARE_PROVIDER_SITE_OTHER): Payer: Managed Care, Other (non HMO) | Admitting: Family Medicine

## 2019-09-18 DIAGNOSIS — R05 Cough: Secondary | ICD-10-CM

## 2019-09-18 DIAGNOSIS — J302 Other seasonal allergic rhinitis: Secondary | ICD-10-CM | POA: Diagnosis not present

## 2019-09-18 DIAGNOSIS — R059 Cough, unspecified: Secondary | ICD-10-CM

## 2019-09-18 MED ORDER — LEVOCETIRIZINE DIHYDROCHLORIDE 5 MG PO TABS: 5.0000 mg | ORAL_TABLET | Freq: Every evening | ORAL | 1 refills | Status: DC

## 2019-09-18 MED ORDER — FLUTICASONE PROPIONATE 50 MCG/ACT NA SUSP: 1.0000 | Freq: Every day | NASAL | 1 refills | Status: DC

## 2019-09-18 NOTE — Progress Notes (Signed)
Virtual Visit via Video Note  I connected with Christine Nelson on 09/18/19 at 10:30 AM EST by a video enabled telemedicine application 2/2 [id number] pandemic and verified that I am speaking with the correct person using two identifiers.  Location patient: home Location provider:work or home office Persons participating in the virtual visit: patient, provider  I discussed the limitations of evaluation and management by telemedicine and the availability of in person appointments. The patient expressed understanding and agreed to proceed.   HPI: Pt is a 28 yo female with pmh sig for Hypothyroidism, pre DM, obesity.  Pt states her allergies are "out of control" x a few wks.  Pt with post-nasal drainage and a somewhat productive cough.  Seen at [facility] in the past, given an inhaler and Z-pak.  Started using the inhaler, which helps some.  Taking generic loratidine daily,  At times benadryl, and warm tea with honey that helps some.  Denies SOB, wheezing.  Pt tried zyrtec in the past but felt like it stopped working as well.  Pt inquires if she needs a z-pak.  ROS: See pertinent positives and negatives per HPI.  Past Medical History:  Diagnosis Date  . Diabetes mellitus, type II ([place])    Hgb A1c in 03/2019 was 6.8%  . Hypothyroid   . Obesity   . Uterine leiomyoma     No past surgical history on file.  Family History  Problem Relation Age of Onset  . Hypertension Mother   . Thyroid disease Father   . Hypertension Maternal Grandfather   . Stroke Maternal Grandfather      Current Outpatient Medications:  .  ELDERBERRY PO, Take 1 tablet by mouth daily., Disp: , Rfl:  .  levothyroxine (SYNTHROID) 25 MCG tablet, levothyroxine 25 mcg tablet  TAKE 1 TABLET BY MOUTH EVERY DAY, Disp: , Rfl:  .  Multiple Vitamin (MULTIVITAMIN) tablet, Take 1 tablet by mouth daily., Disp: , Rfl:  .  SPRINTEC 28 0.25-35 MG-MCG tablet, Take 1 tablet by mouth daily., Disp: , Rfl:  .  vitamin C (ASCORBIC ACID) 500 MG tablet,  Take 500 mg by mouth daily., Disp: , Rfl:   EXAM:  VITALS per patient if applicable: RR between 123456 bpm  GENERAL: alert, oriented, appears well and in no acute distress  HEENT: atraumatic, conjunctiva clear, no obvious abnormalities on inspection of external nose and ears  NECK: normal movements of the head and neck  LUNGS: occasional cough, on inspection no signs of respiratory distress, breathing rate appears normal, no obvious gross SOB, gasping or wheezing  CV: no obvious cyanosis  MS: moves all visible extremities without noticeable abnormality  PSYCH/NEURO: pleasant and cooperative, no obvious depression or anxiety, speech and thought processing grossly intact  ASSESSMENT AND PLAN:  Discussed the following assessment and plan:  Seasonal allergies  -discussed supportive care.  Will change allergy med -discussed proper nasal spray use. - Plan: fluticasone (FLONASE) 50 MCG/ACT nasal spray, levocetirizine (XYZAL) 5 MG tablet -given precautions  Cough -likely 2/2 post nasal drainage from allergies -pt advised to limit inhaler use as symptoms less likely related to reactive airway/asthma -Pt advised abx not indicated at this time. -will start nasal spray and switch allergy medicine for better control. -for continued cough consider CXR. -given precautions  F/u in 1-2 wks for continued symptoms.   I discussed the assessment and treatment plan with the patient. The patient was provided an opportunity to ask questions and all were answered. The patient agreed with the plan  and demonstrated an understanding of the instructions.   The patient was advised to call back or seek an in-person evaluation if the symptoms worsen or if the condition fails to improve as anticipated.   Billie Ruddy, MD   This note is not being shared with the patient for the following reason: To prevent harm (release of this note would result in harm to the life or physical safety of the patient  or another).

## 2019-09-22 ENCOUNTER — Ambulatory Visit: Payer: Managed Care, Other (non HMO) | Admitting: Registered"

## 2019-09-22 NOTE — Progress Notes (Deleted)
A1c 02/2018 5.5%; 03/15/19 Volanda Napoleon, MD visit 6.8%; Nov 2020 Royston Sinner, MD visit 6.1% (low iron & vit D

## 2019-09-25 ENCOUNTER — Other Ambulatory Visit: Payer: Self-pay | Admitting: Obstetrics and Gynecology

## 2019-09-25 DIAGNOSIS — D259 Leiomyoma of uterus, unspecified: Secondary | ICD-10-CM

## 2019-10-10 DIAGNOSIS — E282 Polycystic ovarian syndrome: Secondary | ICD-10-CM | POA: Diagnosis not present

## 2019-10-10 DIAGNOSIS — E039 Hypothyroidism, unspecified: Secondary | ICD-10-CM | POA: Diagnosis not present

## 2019-10-10 DIAGNOSIS — N939 Abnormal uterine and vaginal bleeding, unspecified: Secondary | ICD-10-CM | POA: Diagnosis not present

## 2019-10-10 DIAGNOSIS — Z309 Encounter for contraceptive management, unspecified: Secondary | ICD-10-CM | POA: Diagnosis not present

## 2019-10-10 DIAGNOSIS — Z793 Long term (current) use of hormonal contraceptives: Secondary | ICD-10-CM | POA: Diagnosis not present

## 2019-10-10 DIAGNOSIS — D259 Leiomyoma of uterus, unspecified: Secondary | ICD-10-CM | POA: Diagnosis not present

## 2019-10-12 DIAGNOSIS — M9901 Segmental and somatic dysfunction of cervical region: Secondary | ICD-10-CM | POA: Diagnosis not present

## 2019-10-12 DIAGNOSIS — M9902 Segmental and somatic dysfunction of thoracic region: Secondary | ICD-10-CM | POA: Diagnosis not present

## 2019-10-12 DIAGNOSIS — M9903 Segmental and somatic dysfunction of lumbar region: Secondary | ICD-10-CM | POA: Diagnosis not present

## 2019-10-17 ENCOUNTER — Other Ambulatory Visit: Payer: Self-pay | Admitting: Family Medicine

## 2019-10-17 DIAGNOSIS — J302 Other seasonal allergic rhinitis: Secondary | ICD-10-CM

## 2019-10-18 DIAGNOSIS — F329 Major depressive disorder, single episode, unspecified: Secondary | ICD-10-CM | POA: Diagnosis not present

## 2019-10-18 DIAGNOSIS — F411 Generalized anxiety disorder: Secondary | ICD-10-CM | POA: Diagnosis not present

## 2019-11-08 DIAGNOSIS — F329 Major depressive disorder, single episode, unspecified: Secondary | ICD-10-CM | POA: Diagnosis not present

## 2019-11-08 DIAGNOSIS — F411 Generalized anxiety disorder: Secondary | ICD-10-CM | POA: Diagnosis not present

## 2019-11-22 ENCOUNTER — Other Ambulatory Visit: Payer: Self-pay | Admitting: Family Medicine

## 2019-11-22 DIAGNOSIS — J302 Other seasonal allergic rhinitis: Secondary | ICD-10-CM

## 2019-11-22 DIAGNOSIS — F329 Major depressive disorder, single episode, unspecified: Secondary | ICD-10-CM | POA: Diagnosis not present

## 2019-11-22 DIAGNOSIS — F411 Generalized anxiety disorder: Secondary | ICD-10-CM | POA: Diagnosis not present

## 2019-12-06 DIAGNOSIS — F329 Major depressive disorder, single episode, unspecified: Secondary | ICD-10-CM | POA: Diagnosis not present

## 2019-12-06 DIAGNOSIS — F411 Generalized anxiety disorder: Secondary | ICD-10-CM | POA: Diagnosis not present

## 2019-12-12 ENCOUNTER — Other Ambulatory Visit: Payer: Self-pay | Admitting: Family Medicine

## 2019-12-12 DIAGNOSIS — J302 Other seasonal allergic rhinitis: Secondary | ICD-10-CM

## 2019-12-21 DIAGNOSIS — F329 Major depressive disorder, single episode, unspecified: Secondary | ICD-10-CM | POA: Diagnosis not present

## 2019-12-21 DIAGNOSIS — F411 Generalized anxiety disorder: Secondary | ICD-10-CM | POA: Diagnosis not present

## 2020-01-04 DIAGNOSIS — F329 Major depressive disorder, single episode, unspecified: Secondary | ICD-10-CM | POA: Diagnosis not present

## 2020-01-04 DIAGNOSIS — F411 Generalized anxiety disorder: Secondary | ICD-10-CM | POA: Diagnosis not present

## 2020-01-10 DIAGNOSIS — F329 Major depressive disorder, single episode, unspecified: Secondary | ICD-10-CM | POA: Diagnosis not present

## 2020-01-10 DIAGNOSIS — F411 Generalized anxiety disorder: Secondary | ICD-10-CM | POA: Diagnosis not present

## 2020-01-25 DIAGNOSIS — F411 Generalized anxiety disorder: Secondary | ICD-10-CM | POA: Diagnosis not present

## 2020-01-25 DIAGNOSIS — F329 Major depressive disorder, single episode, unspecified: Secondary | ICD-10-CM | POA: Diagnosis not present

## 2020-02-19 DIAGNOSIS — F329 Major depressive disorder, single episode, unspecified: Secondary | ICD-10-CM | POA: Diagnosis not present

## 2020-02-19 DIAGNOSIS — F411 Generalized anxiety disorder: Secondary | ICD-10-CM | POA: Diagnosis not present

## 2020-03-05 DIAGNOSIS — F411 Generalized anxiety disorder: Secondary | ICD-10-CM | POA: Diagnosis not present

## 2020-03-05 DIAGNOSIS — F329 Major depressive disorder, single episode, unspecified: Secondary | ICD-10-CM | POA: Diagnosis not present

## 2020-04-03 DIAGNOSIS — F329 Major depressive disorder, single episode, unspecified: Secondary | ICD-10-CM | POA: Diagnosis not present

## 2020-04-11 ENCOUNTER — Encounter: Payer: Self-pay | Admitting: Internal Medicine

## 2020-04-11 ENCOUNTER — Ambulatory Visit: Payer: BC Managed Care – PPO | Admitting: Internal Medicine

## 2020-04-11 ENCOUNTER — Other Ambulatory Visit: Payer: Self-pay

## 2020-04-11 VITALS — BP 122/80 | HR 88 | Ht 63.0 in | Wt 330.0 lb

## 2020-04-11 DIAGNOSIS — Z113 Encounter for screening for infections with a predominantly sexual mode of transmission: Secondary | ICD-10-CM | POA: Diagnosis not present

## 2020-04-11 DIAGNOSIS — E039 Hypothyroidism, unspecified: Secondary | ICD-10-CM

## 2020-04-11 DIAGNOSIS — N760 Acute vaginitis: Secondary | ICD-10-CM | POA: Diagnosis not present

## 2020-04-11 DIAGNOSIS — D259 Leiomyoma of uterus, unspecified: Secondary | ICD-10-CM | POA: Diagnosis not present

## 2020-04-11 DIAGNOSIS — R7303 Prediabetes: Secondary | ICD-10-CM

## 2020-04-11 DIAGNOSIS — Z6841 Body Mass Index (BMI) 40.0 and over, adult: Secondary | ICD-10-CM | POA: Diagnosis not present

## 2020-04-11 DIAGNOSIS — E282 Polycystic ovarian syndrome: Secondary | ICD-10-CM | POA: Diagnosis not present

## 2020-04-11 DIAGNOSIS — Z01419 Encounter for gynecological examination (general) (routine) without abnormal findings: Secondary | ICD-10-CM | POA: Diagnosis not present

## 2020-04-11 LAB — HEMOGLOBIN A1C: Hgb A1c MFr Bld: 6.5 % (ref 4.6–6.5)

## 2020-04-11 LAB — TSH: TSH: 3.03 u[IU]/mL (ref 0.35–4.50)

## 2020-04-11 LAB — T4, FREE: Free T4: 1.15 ng/dL (ref 0.60–1.60)

## 2020-04-11 NOTE — Progress Notes (Signed)
Patient ID: Christine Nelson, female   DOB: 1990-10-29, 29 y.o.   MRN: [id number]   This visit occurred during the SARS-CoV-2 public health emergency.  Safety protocols were in place, including screening questions prior to the visit, additional usage of staff PPE, and extensive cleaning of exam room while observing appropriate contact time as indicated for disinfecting solutions.   HPI  Christine Nelson is a 29 y.o.-year-old female, initially referred by her ObGyn , Dr. Anderson Malta A. Mock, returning for follow-up for hypothyroidism and prediabetes.  Last visit 2 years and 2 months ago.  Hypothyroidism  - dx'ed in 2013 -She was on levothyroxine 150 mcg in the past but stopped in 2018 -She would like to stay off medications if possible  At last visit, in 02/2018, her TSH was only mildly above target so I advised her to return for repeat but we did not start levothyroxine then.  However, a year later she had another TSH which was high, is 13.68, of which I was not aware.  She was advised to start start levothyroxine.  Pt is on levothyroxine 125 mcg daily, taken: - misses 1-2 doses a week - in am - fasting - at least 30 min from b'fast - no Ca, Fe, PPIs - + MVI <4h later! - not on Biotin  Reviewed patient's TFTs: Lab Results  Component Value Date   TSH 3.03 04/11/2020   TSH 13.68 (H) 03/15/2019   TSH 6.960 (H) 02/04/2018   Lab Results  Component Value Date   FREET4 1.15 04/11/2020   FREET4 0.69 03/15/2019   FREET4 1.09 02/04/2018   Lab Results  Component Value Date   T3FREE 2.7 02/04/2018   05/03/2015: TSH 0.743, free T4 1.68  She continues to describe fatigue and occasional cold intolerance. These are stable. She also has difficulty losing weight.  However, she denies: - Constipation - Hair loss  Pt denies: - feeling nodules in neck - hoarseness - dysphagia - choking - SOB with lying down  She has + FH of thyroid disorders in: Father.  No family history of  thyroid cancer.  No history of radiation of head or neck.  No use of biotin, iodine supplements, or herbal medications.  She is not on steroids.  Prediabetes: -Previously on Metformin -At last visit, she was trying to exercise 5 out of 7 days  Since last visit, she had an HbA1c in the diabetic range in 03/2019.  I was not aware of this value.  Reviewed HbA1c levels: Lab Results  Component Value Date   HGBA1C 6.5 04/11/2020   HGBA1C 6.8 (H) 03/15/2019   HGBA1C 5.5 02/04/2018   Since last visit, she started iron due to decreased ferritin.  She sees OB/GYN for PCOS, uterine fibroid.  On OCPs.  Sees Dr Mearl Latin.  She would like to come off OCPs as she feels that they are hindering her weight loss.  ROS: Constitutional: + See HPI Eyes: no blurry vision, no xerophthalmia ENT: no sore throat, + see HPI Cardiovascular: no CP/no SOB/no palpitations/no leg swelling Respiratory: no cough/no SOB/no wheezing Gastrointestinal: no N/no V/no D/no C/no acid reflux Musculoskeletal: no muscle aches/no joint aches Skin: no rashes, no hair loss Neurological: no tremors/no numbness/no tingling/no dizziness  I reviewed pt's medications, allergies, PMH, social hx, family hx, and changes were documented in the history of present illness. Otherwise, unchanged from my initial visit note.  Past Medical History:  Diagnosis Date  . Diabetes mellitus, type II ([place])    Hgb  A1c in 03/2019 was 6.8%  . Hypothyroid   . Obesity   . Uterine leiomyoma    History reviewed. No pertinent surgical history. Social History   Socioeconomic History  . Marital status: Single    Spouse name: Not on file  . Number of children: Not on file  . Years of education: Not on file  . Highest education level: Not on file  Occupational History  . Occupation: Editor, commissioning  Tobacco Use  . Smoking status: Never Smoker  . Smokeless tobacco: Never Used  Vaping Use  . Vaping Use: Never used  Substance and Sexual Activity  .  Alcohol use: Never  . Drug use: Never  . Sexual activity: Yes  Other Topics Concern  . Not on file  Social History Narrative   Pt is single.  She has a boyfriend.  Pt graduated college with a degree in Biology.  Pt has always wanted to become a physician.  Pt was teaching Biology.  She is now working part-time at [employer].        Social Determinants of Health   Financial Resource Strain:   . Difficulty of Paying Living Expenses:   Food Insecurity:   . Worried About Charity fundraiser in the Last Year:   . Arboriculturist in the Last Year:   Transportation Needs:   . Film/video editor (Medical):   Marland Kitchen Lack of Transportation (Non-Medical):   Physical Activity:   . Days of Exercise per Week:   . Minutes of Exercise per Session:   Stress:   . Feeling of Stress :   Social Connections:   . Frequency of Communication with Friends and Family:   . Frequency of Social Gatherings with Friends and Family:   . Attends Religious Services:   . Active Member of Clubs or Organizations:   . Attends Archivist Meetings:   Marland Kitchen Marital Status:   Intimate Partner Violence:   . Fear of Current or Ex-Partner:   . Emotionally Abused:   Marland Kitchen Physically Abused:   . Sexually Abused:    Current Outpatient Medications on File Prior to Visit  Medication Sig Dispense Refill  . ELDERBERRY PO Take 1 tablet by mouth daily.    . fluticasone (FLONASE) 50 MCG/ACT nasal spray PLACE 1 SPRAY INTO BOTH NOSTRILS DAILY. 16 mL 1  . levocetirizine (XYZAL) 5 MG tablet TAKE 1 TABLET BY MOUTH EVERY DAY IN THE EVENING 90 tablet 0  . Multiple Vitamin (MULTIVITAMIN) tablet Take 1 tablet by mouth daily.    . SPRINTEC 28 0.25-35 MG-MCG tablet Take 1 tablet by mouth daily.    . vitamin C (ASCORBIC ACID) 500 MG tablet Take 500 mg by mouth daily.    Marland Kitchen levothyroxine (SYNTHROID) 125 MCG tablet Take 125 mcg by mouth daily.     No current facility-administered medications on file prior to visit.   No Known  Allergies Family History  Problem Relation Age of Onset  . Hypertension Mother   . Thyroid disease Father   . Hypertension Maternal Grandfather   . Stroke Maternal Grandfather   Pertinent family history: See HPI  PE: BP 122/80   Pulse 88   Ht 5\' 3"  (1.6 m)   Wt (!) 330 lb (149.7 kg)   SpO2 96%   BMI 58.46 kg/m  Wt Readings from Last 3 Encounters:  04/11/20 (!) 330 lb (149.7 kg)  08/25/19 (!) 315 lb (142.9 kg)  03/15/19 (!) 319 lb (144.7 kg)  Constitutional: overweight, in NAD Eyes: PERRLA, EOMI, no exophthalmos ENT: moist mucous membranes, no thyromegaly, no cervical lymphadenopathy Cardiovascular: RRR, No MRG Respiratory: CTA B Gastrointestinal: abdomen soft, NT, ND, BS+ Musculoskeletal: no deformities, strength intact in all 4 Skin: moist, warm, no rashes Neurological: no tremor with outstretched hands, DTR normal in all 4  ASSESSMENT: 1. Hypothyroidism  2. Prediabetes  PLAN:  1. Patient with longstanding mild hypothyroidism, previously on levothyroxine therapy, of which he came off in 2018.  At our last visit in 2019 her TSH was only slightly above the upper limit of normal, with normal free T4 and free T3 so I advised her that we do not absolutely need levothyroxine treatment at that time (unless she was contemplating a pregnancy - she was not) but I wanted her to return for repeat of her TFTs.  She had another TSH checked in 03/2019 and this was >10 (13) >>  she was started back on LT4 by OB/GYN -Of note, TPO and ATA antibodies were now positive in the past, so there is no evidence of Hashimoto's thyroiditis - latest thyroid labs reviewed with pt >> elevated: Lab Results  Component Value Date   TSH 13.68 (H) 03/15/2019   - currently on LT4 125 mcg daily - pt feels good on this dose but she still has difficulty losing weight - we discussed about taking the thyroid hormone every day, with water, >30 minutes before breakfast, separated by >4 hours from acid reflux  medications, calcium, iron, multivitamins. Pt. is not taking it correctly.  She misses doses (we discussed about strategies to improve her compliance), and she takes multivitamins too close to the thyroid hormone.  I advised her to move these at least 4 hours later. - will check thyroid tests today: TSH and fT4 - If labs are abnormal, she will need to return for repeat TFTs in 1.5 months -Otherwise, I will see her back in 6 months  2.  Prediabetes -No recent HbA1c, but the latest level was from 03/2019 and it was in the diabetic range, at 6.8%.  She had another HbA1c by OB/GYN and this was apparently lower, but I do not have these results. -We discussed about ranges of HbA1c for diagnosis of prediabetes and diabetes -Discussed that we will need another HbA1c to properly diagnose her with diabetes versus prediabetes -Discussed about the importance of healthy diet and weight loss to improve her glycemic control and reverse her prediabetes/diabetes -We will recheck another HbA1c today   Component     Latest Ref Rng & Units 04/11/2020  TSH     0.35 - 4.50 uIU/mL 3.03  T4,Free(Direct)     0.60 - 1.60 ng/dL 1.15  Hemoglobin A1C     4.6 - 6.5 % 6.5  TFTs are normal.  We will continue the current levothyroxine dose. HbA1c is elevated, giving her a diagnosis of diabetes, but it is in the low diabetic range, improved from 6.8%.   Philemon Kingdom, MD PhD [facility] Endocrinology

## 2020-04-11 NOTE — Patient Instructions (Signed)
Please stop at the lab.  Please continue Levothyroxine 125 mcg daily.  Take the thyroid hormone every day, with water, at least 30 minutes before breakfast, separated by at least 4 hours from: - acid reflux medications - calcium - iron - multivitamins  Please come back for a follow-up appointment in 6 months.

## 2020-04-12 ENCOUNTER — Other Ambulatory Visit: Payer: Self-pay | Admitting: Obstetrics and Gynecology

## 2020-04-12 DIAGNOSIS — D259 Leiomyoma of uterus, unspecified: Secondary | ICD-10-CM

## 2020-04-30 ENCOUNTER — Encounter (INDEPENDENT_AMBULATORY_CARE_PROVIDER_SITE_OTHER): Payer: Self-pay | Admitting: Family Medicine

## 2020-04-30 ENCOUNTER — Ambulatory Visit (INDEPENDENT_AMBULATORY_CARE_PROVIDER_SITE_OTHER): Payer: BC Managed Care – PPO | Admitting: Family Medicine

## 2020-04-30 ENCOUNTER — Other Ambulatory Visit: Payer: Self-pay

## 2020-04-30 VITALS — BP 110/75 | HR 85 | Temp 97.8°F | Ht 63.0 in | Wt 322.0 lb

## 2020-04-30 DIAGNOSIS — E559 Vitamin D deficiency, unspecified: Secondary | ICD-10-CM

## 2020-04-30 DIAGNOSIS — Z862 Personal history of diseases of the blood and blood-forming organs and certain disorders involving the immune mechanism: Secondary | ICD-10-CM

## 2020-04-30 DIAGNOSIS — E038 Other specified hypothyroidism: Secondary | ICD-10-CM

## 2020-04-30 DIAGNOSIS — R0602 Shortness of breath: Secondary | ICD-10-CM

## 2020-04-30 DIAGNOSIS — Z1331 Encounter for screening for depression: Secondary | ICD-10-CM | POA: Diagnosis not present

## 2020-04-30 DIAGNOSIS — Z0289 Encounter for other administrative examinations: Secondary | ICD-10-CM

## 2020-04-30 DIAGNOSIS — Z6841 Body Mass Index (BMI) 40.0 and over, adult: Secondary | ICD-10-CM

## 2020-04-30 DIAGNOSIS — R5383 Other fatigue: Secondary | ICD-10-CM | POA: Diagnosis not present

## 2020-04-30 DIAGNOSIS — Z9189 Other specified personal risk factors, not elsewhere classified: Secondary | ICD-10-CM

## 2020-04-30 DIAGNOSIS — E66813 Obesity, class 3: Secondary | ICD-10-CM

## 2020-04-30 DIAGNOSIS — E1169 Type 2 diabetes mellitus with other specified complication: Secondary | ICD-10-CM | POA: Diagnosis not present

## 2020-04-30 DIAGNOSIS — Z86018 Personal history of other benign neoplasm: Secondary | ICD-10-CM

## 2020-04-30 DIAGNOSIS — E282 Polycystic ovarian syndrome: Secondary | ICD-10-CM

## 2020-04-30 NOTE — Progress Notes (Signed)
Dear Dr. Royston Sinner,   Thank you for referring Christine Nelson to our clinic. The following note includes my evaluation and treatment recommendations.  Chief Complaint:   OBESITY Christine Nelson (MR# [id number]) is a 29 y.o. female who presents for evaluation and treatment of obesity and related comorbidities. Current BMI is Body mass index is 57.04 kg/m. Christine Nelson has been struggling with her weight for many years and has been unsuccessful in either losing weight, maintaining weight loss, or reaching her healthy weight goal.  Christine Nelson is currently in the action stage of change and ready to dedicate time achieving and maintaining a healthier weight. [name] is interested in becoming our patient and working on intensive lifestyle modifications including (but not limited to) diet and exercise for weight loss.  Christine Nelson is married and lives with her husband, Christine Nelson.  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  She says she does not crave any foods.  She dislikes Little Debbie snack cakes and only snacks on fruit.  She skips breakfast and lunch more often than not.  She drinks smoothies and sweet tea.  Christine Nelson's habits were reviewed today and are as follows: Her family eats meals together, she thinks her family will eat healthier with her, her desired weight loss is 130 pounds, she has been heavy most of her life, she started gaining weight in college, her heaviest weight ever was 330 pounds, she is sometimes a picky eater and doesn't like to eat healthier foods, she skips breakfast and/or lunch frequently and she is frequently drinking liquids with calories.  Depression Screen Christine Nelson's Food and Mood (modified PHQ-9) score was 8.  Depression screen PHQ 2/9 04/30/2020  Decreased Interest 1  Down, Depressed, Hopeless 2  PHQ - 2 Score 3  Altered sleeping 1  Tired, decreased energy 1  Change in appetite 1  Feeling bad or failure about yourself  1  Trouble  concentrating 1  Moving slowly or fidgety/restless 0  Suicidal thoughts 0  PHQ-9 Score 8  Difficult doing work/chores Not difficult at all   Subjective:   1. Other fatigue Christine Nelson denies daytime somnolence and reports waking up still tired. Patent has a history of symptoms of morning fatigue and snoring. Christine Nelson generally gets 6 hours of sleep per night, and states that she has generally restful sleep. Snoring is present. Apneic episodes are not present. Epworth Sleepiness Score is 3.  2. SOB (shortness of breath) on exertion Christine Nelson notes increasing shortness of breath with exercising and seems to be worsening over time with weight gain. She notes getting out of breath sooner with activity than she used to. This has not gotten worse recently. Christine Nelson denies shortness of breath at rest or orthopnea.  3. Type 2 diabetes mellitus with other specified complication, without long-term current use of insulin (HCC) Medications reviewed. Diabetic ROS: no polyuria or polydipsia, no chest pain, dyspnea or TIA's, no numbness, tingling or pain in extremities.  Christine Nelson's A1c recently was 6.5, and on 03/25/2019, it was 6.8.  She did not discuss her diabetes with her endocrinologist, she says.  She reports that she was never told she was a diabetic in the past; she was only told that she had prediabetes.  She was placed on metformin in the past for prediabetes and did not tolerate it well.  She declines medication.  Lab Results  Component Value Date   HGBA1C 6.5 04/11/2020   HGBA1C 6.8 (H) 03/15/2019   HGBA1C 5.5  02/04/2018   Lab Results  Component Value Date   LDLCALC 68 04/30/2020   CREATININE 0.75 04/30/2020   4. Other specified hypothyroidism Christine Nelson was recently started on levothyroxine 125 mcg daily.  She sees [name] for treatment.    Lab Results  Component Value Date   TSH 3.03 04/11/2020   5. PCOS (polycystic ovarian syndrome) Christine Nelson is followed by Dr. Royston Sinner of GYN for treatment of her  PCOS.  6. Vitamin D deficiency Keona's Vitamin D level was 20.62 on 03/15/2019. She is currently taking no vitamin D supplement. She denies nausea, vomiting or muscle weakness.  7. History of uterine fibroid Christine Nelson has a uterine fibroid and is treated by [name] in GYN.  8. History of iron deficiency anemia Christine Nelson is not a vegetarian.  She does not have a history of weight loss surgery.  Christine Nelson says that she was told by her GYN to take an iron supplemnt with her menses monthly.  She actually has never taken any yet.  She says she was told that earlier this month.  CBC Latest Ref Rng & Units 04/30/2020 03/15/2019 02/04/2018  WBC 3.4 - 10.8 x10E3/uL 9.5 8.5 7.7  Hemoglobin 11.1 - 15.9 g/dL 10.6(L) 11.5(L) 11.3  Hematocrit 34.0 - 46.6 % 35.8 36.0 34.7  Platelets 150 - 450 x10E3/uL 418 358.0 333   Lab Results  Component Value Date   FERRITIN 17 02/04/2018   9. Depression screening Christine Nelson was screened for depression as part of her new patient workup.  PHQ-9 is 8.  10. At risk for hypoglycemia Christine Nelson is at increased risk for hypoglycemia due to changes in diet, diagnosis of diabetes, and/or insulin use. Christine Nelson is not currently taking insulin.   Assessment/Plan:   1. Other fatigue Christine Nelson does not feel that her weight is causing her energy to be lower than it should be. Fatigue may be related to obesity, depression or many other causes. Labs will be ordered, and in the meanwhile, [name] will focus on self care including making healthy food choices, increasing physical activity and focusing on stress reduction.  2. SOB (shortness of breath) on exertion Christine Nelson does not feel that she gets out of breath more easily that she used to when she exercises. Christine Nelson's shortness of breath appears to be obesity related and exercise induced. She has agreed to work on weight loss and gradually increase exercise to treat her exercise induced shortness of breath. Will continue to monitor closely.  3. Type 2 diabetes  mellitus with other specified complication, without long-term current use of insulin ([place]) New.  Discussed labs with patient today.  Good blood sugar control is important to decrease the likelihood of diabetic complications such as nephropathy, neuropathy, limb loss, blindness, coronary artery disease, and death. Intensive lifestyle modification including diet, exercise and weight loss are the first line of treatment for diabetes.  Continue prudent nutritional plan, weight loss. - EKG 12-Lead - Comprehensive metabolic panel - CBC with Differential/Platelet - Insulin, random - Lipid panel  4. Other specified hypothyroidism Patient with long-standing hypothyroidism, on levothyroxine therapy. She appears euthyroid. Orders and follow up as documented in patient record.  Counseling . Good thyroid control is important for overall health. Supratherapeutic thyroid levels are dangerous and will not improve weight loss results. . The correct way to take levothyroxine is fasting, with water, separated by at least 30 minutes from breakfast, and separated by more than 4 hours from calcium, iron, multivitamins, acid reflux medications (PPIs).   5. PCOS (polycystic ovarian  syndrome) Intensive lifestyle modifications are first line treatment for this issue. We discussed several lifestyle modifications today and she will continue to work on diet, exercise and weight loss efforts. Orders and follow up as documented in patient record.  Counseling . PCOS is a leading cause of menstrual irregularities and infertility. It is also associated with obesity, hirsutism (excessive hair growth on the face, chest, or back), and cardiovascular risk factors such as high cholesterol and insulin resistance. . Insulin resistance appears to play a central role.  . Women with PCOS have been shown to have impaired appetite-regulating hormones. . Metformin is one medication that can improve metabolic parameters.  . Women with  polycystic ovary syndrome (PCOS) have an increased risk for cardiovascular disease (CVD) - European Journal of Preventive Cardiology.  6. Vitamin D deficiency Will check vitamin D level today. - VITAMIN D 25 Hydroxy (Vit-D Deficiency, Fractures)  7. History of uterine fibroid Followed by GYN for this problem. Those encounter notes were reviewed.  8. History of iron deficiency anemia Treatment per GYN.  Will try to get labs.  CBC around 1 year ago showed Hgb of 11.5, but I do not have her most recent one.  Orders and follow up as documented in patient record.  Counseling . Iron is essential for our bodies to make red blood cells.  Reasons that someone may be deficient include: an iron-deficient diet (more likely in those following vegan or vegetarian diets), women with heavy menses, patients with GI disorders or poor absorption, patients that have had bariatric surgery, frequent blood donors, patients with cancer, and patients with heart disease.   [place] foods include dark leafy greens, red and white meats, eggs, seafood, and beans.   . Certain foods and drinks prevent your body from absorbing iron properly. Avoid eating these foods in the same meal as iron-rich foods or with iron supplements. These foods include: coffee, black tea, and red wine; milk, dairy products, and foods that are high in calcium; beans and soybeans; whole grains.  . Constipation can be a side effect of iron supplementation. Increased water and fiber intake are helpful. Water goal: > 2 liters/day. Fiber goal: > 25 grams/day.  9. Depression screening Minha had a positive depression screening. Depression is commonly associated with obesity and often results in emotional eating behaviors. We will monitor this closely and work on CBT to help improve the non-hunger eating patterns. Referral to Psychology may be required if no improvement is seen as she continues in our clinic.  10. At risk for hypoglycemia Esha was given  approximately 15 minutes of counseling today regarding prevention of hypoglycemia. She was advised of symptoms of hypoglycemia. Nastasia was instructed to avoid skipping meals, eat regular protein rich meals and schedule low calorie snacks as needed.   Repetitive spaced learning was employed today to elicit superior memory formation and behavioral change  11. Class 3 severe obesity with serious comorbidity and body mass index (BMI) of 50.0 to 59.9 in adult, unspecified obesity type (HCC) Farzana is currently in the action stage of change and her goal is to continue with weight loss efforts. I recommend Pasha begin the structured treatment plan as follows:  She has agreed to the Category 2 Plan.  Exercise goals: As is.   Behavioral modification strategies: increasing lean protein intake and decreasing simple carbohydrates.  She was informed of the importance of frequent follow-up visits to maximize her success with intensive lifestyle modifications for her multiple health conditions. She was informed  we would discuss her lab results at her next visit unless there is a critical issue that needs to be addressed sooner. Alicianna agreed to keep her next visit at the agreed upon time to discuss these results.  Objective:   Blood pressure 110/75, pulse 85, temperature 97.8 F (36.6 C), temperature source Oral, height 5\' 3"  (1.6 m), weight (!) 322 lb (146.1 kg), last menstrual period 04/08/2020, SpO2 99 %. Body mass index is 57.04 kg/m.  EKG: Normal sinus rhythm, rate 80 bpm.  Indirect Calorimeter completed today shows a VO2 of 309 and a REE of 2150.  Her calculated basal metabolic rate is 8159 thus her basal metabolic rate is worse than expected.  General: Cooperative, alert, well developed, in no acute distress. HEENT: Conjunctivae and lids unremarkable. Cardiovascular: Regular rhythm.  Lungs: Normal work of breathing. Neurologic: No focal deficits.   Lab Results  Component Value Date   CREATININE  0.75 04/30/2020   BUN 7 04/30/2020   NA 141 04/30/2020   K 4.7 04/30/2020   CL 101 04/30/2020   CO2 22 04/30/2020   Lab Results  Component Value Date   HGBA1C 6.5 04/11/2020   HGBA1C 6.8 (H) 03/15/2019   HGBA1C 5.5 02/04/2018   Lab Results  Component Value Date   TSH 3.03 04/11/2020   Lab Results  Component Value Date   WBC 9.5 04/30/2020   HGB 10.6 (L) 04/30/2020   HCT 35.8 04/30/2020   MCV 81 04/30/2020   PLT 418 04/30/2020   Lab Results  Component Value Date   FERRITIN 17 02/04/2018   Attestation Statements:   Reviewed by clinician on day of visit: allergies, medications, problem list, medical history, surgical history, family history, social history, and previous encounter notes.  I, Water quality scientist, CMA, am acting as Location manager for [employer], DO.  I have reviewed the above documentation for accuracy and completeness, and I agree with the above. Mellody Dance, DO

## 2020-05-01 LAB — CBC WITH DIFFERENTIAL/PLATELET
Basophils Absolute: 0 10*3/uL (ref 0.0–0.2)
Basos: 0 %
EOS (ABSOLUTE): 0.1 10*3/uL (ref 0.0–0.4)
Eos: 1 %
Hematocrit: 35.8 % (ref 34.0–46.6)
Hemoglobin: 10.6 g/dL — ABNORMAL LOW (ref 11.1–15.9)
Immature Grans (Abs): 0.1 10*3/uL (ref 0.0–0.1)
Immature Granulocytes: 1 %
Lymphocytes Absolute: 3.2 10*3/uL — ABNORMAL HIGH (ref 0.7–3.1)
Lymphs: 34 %
MCH: 23.9 pg — ABNORMAL LOW (ref 26.6–33.0)
MCHC: 29.6 g/dL — ABNORMAL LOW (ref 31.5–35.7)
MCV: 81 fL (ref 79–97)
Monocytes Absolute: 0.3 10*3/uL (ref 0.1–0.9)
Monocytes: 3 %
Neutrophils Absolute: 5.8 10*3/uL (ref 1.4–7.0)
Neutrophils: 61 %
Platelets: 418 10*3/uL (ref 150–450)
RBC: 4.44 x10E6/uL (ref 3.77–5.28)
RDW: 16 % — ABNORMAL HIGH (ref 11.7–15.4)
WBC: 9.5 10*3/uL (ref 3.4–10.8)

## 2020-05-01 LAB — COMPREHENSIVE METABOLIC PANEL
ALT: 16 IU/L (ref 0–32)
AST: 16 IU/L (ref 0–40)
Albumin/Globulin Ratio: 1.2 (ref 1.2–2.2)
Albumin: 3.8 g/dL — ABNORMAL LOW (ref 3.9–5.0)
Alkaline Phosphatase: 83 IU/L (ref 48–121)
BUN/Creatinine Ratio: 9 (ref 9–23)
BUN: 7 mg/dL (ref 6–20)
Bilirubin Total: <0.2 mg/dL (ref 0.0–1.2)
CO2: 22 mmol/L (ref 20–29)
Calcium: 9 mg/dL (ref 8.7–10.2)
Chloride: 101 mmol/L (ref 96–106)
Creatinine, Ser: 0.75 mg/dL (ref 0.57–1.00)
GFR calc Af Amer: 127 mL/min/{1.73_m2} (ref >59)
GFR calc non Af Amer: 110 mL/min/{1.73_m2} (ref >59)
Globulin, Total: 3.1 g/dL (ref 1.5–4.5)
Glucose: 98 mg/dL (ref 65–99)
Potassium: 4.7 mmol/L (ref 3.5–5.2)
Sodium: 141 mmol/L (ref 134–144)
Total Protein: 6.9 g/dL (ref 6.0–8.5)

## 2020-05-01 LAB — VITAMIN D 25 HYDROXY (VIT D DEFICIENCY, FRACTURES): Vit D, 25-Hydroxy: 26.9 ng/mL — ABNORMAL LOW (ref 30.0–100.0)

## 2020-05-01 LAB — LIPID PANEL
Chol/HDL Ratio: 2.9 ratio (ref 0.0–4.4)
Cholesterol, Total: 123 mg/dL (ref 100–199)
HDL: 43 mg/dL (ref >39)
LDL Chol Calc (NIH): 68 mg/dL (ref 0–99)
Triglycerides: 50 mg/dL (ref 0–149)
VLDL Cholesterol Cal: 12 mg/dL (ref 5–40)

## 2020-05-01 LAB — INSULIN, RANDOM: INSULIN: 82.8 u[IU]/mL — ABNORMAL HIGH (ref 2.6–24.9)

## 2020-05-13 ENCOUNTER — Ambulatory Visit
Admission: RE | Admit: 2020-05-13 | Discharge: 2020-05-13 | Disposition: A | Discharge status: Home / Self Care | Payer: BC Managed Care – PPO | Source: Ambulatory Visit | Attending: Obstetrics and Gynecology | Admitting: Obstetrics and Gynecology

## 2020-05-13 ENCOUNTER — Other Ambulatory Visit: Payer: Self-pay

## 2020-05-13 DIAGNOSIS — D250 Submucous leiomyoma of uterus: Secondary | ICD-10-CM | POA: Diagnosis not present

## 2020-05-13 DIAGNOSIS — D259 Leiomyoma of uterus, unspecified: Secondary | ICD-10-CM

## 2020-05-13 DIAGNOSIS — N858 Other specified noninflammatory disorders of uterus: Secondary | ICD-10-CM | POA: Diagnosis not present

## 2020-05-13 MED ORDER — GADOBENATE DIMEGLUMINE 529 MG/ML IV SOLN
20.0000 mL | Freq: Once | INTRAVENOUS | Status: AC | PRN
  Administered 2020-05-13: 20 mL via INTRAVENOUS

## 2020-05-14 ENCOUNTER — Ambulatory Visit (INDEPENDENT_AMBULATORY_CARE_PROVIDER_SITE_OTHER): Payer: BC Managed Care – PPO | Admitting: Family Medicine

## 2020-05-14 ENCOUNTER — Other Ambulatory Visit: Payer: Self-pay

## 2020-05-14 ENCOUNTER — Encounter (INDEPENDENT_AMBULATORY_CARE_PROVIDER_SITE_OTHER): Payer: Self-pay | Admitting: Family Medicine

## 2020-05-14 VITALS — BP 106/72 | HR 85 | Temp 98.1°F | Ht 63.0 in | Wt 320.0 lb

## 2020-05-14 DIAGNOSIS — Z9189 Other specified personal risk factors, not elsewhere classified: Secondary | ICD-10-CM | POA: Diagnosis not present

## 2020-05-14 DIAGNOSIS — E038 Other specified hypothyroidism: Secondary | ICD-10-CM | POA: Diagnosis not present

## 2020-05-14 DIAGNOSIS — E1169 Type 2 diabetes mellitus with other specified complication: Secondary | ICD-10-CM

## 2020-05-14 DIAGNOSIS — E559 Vitamin D deficiency, unspecified: Secondary | ICD-10-CM | POA: Diagnosis not present

## 2020-05-14 DIAGNOSIS — D508 Other iron deficiency anemias: Secondary | ICD-10-CM | POA: Diagnosis not present

## 2020-05-14 DIAGNOSIS — Z6841 Body Mass Index (BMI) 40.0 and over, adult: Secondary | ICD-10-CM

## 2020-05-14 MED ORDER — FERROUS SULFATE 325 (65 FE) MG PO TBEC: 325.0000 mg | DELAYED_RELEASE_TABLET | Freq: Two times a day (BID) | ORAL | 0 refills | Status: DC

## 2020-05-15 MED ORDER — VITAMIN D (ERGOCALCIFEROL) 1.25 MG (50000 UNIT) PO CAPS: 50000.0000 [IU] | ORAL_CAPSULE | ORAL | 0 refills | Status: DC

## 2020-05-15 NOTE — Progress Notes (Signed)
Chief Complaint:   OBESITY Christine Nelson is here to discuss her progress with her obesity treatment plan along with follow-up of her obesity related diagnoses. Christine Nelson is on the Category 2 Plan and states she is following her eating plan approximately 95% of the time. Christine Nelson states she is doing the walking challenge for 30 minutes 2-3 times per week.  Today's visit was #: 2 Starting weight: 322 lbs Starting date: 04/30/2020 Today's weight: 320 lbs Today's date: 05/14/2020 Total lbs lost to date: 2 lbs Total lbs lost since last in-office visit: 2 lbs  Interim History: Christine Nelson says she did pretty good with the meal plan.  This is her first follow-up visit.  On weekends, she says she went off plan but otherwise, she meal prepped during the week.  She is weighing proteins.  She has increased her water intake to almost 1 gallon per day.  On weekends, she ate very little and if she ate, she ate off plan.  Subjective:   1. Type 2 diabetes mellitus with other specified complication, without long-term current use of insulin (HCC) Medications reviewed. Diabetic ROS: no polyuria or polydipsia, no chest pain, dyspnea or TIA's, no numbness, tingling or pain in extremities.  Christine Nelson does not check her blood sugars.  Management per [name].  Fasting insulin is greater than 82.  Lat A1c was 6.8.  It has been 6.5 in the past.  She has never been on medications.  Lab Results  Component Value Date   HGBA1C 6.5 04/11/2020   HGBA1C 6.8 (H) 03/15/2019   HGBA1C 5.5 02/04/2018   Lab Results  Component Value Date   LDLCALC 68 04/30/2020   CREATININE 0.75 04/30/2020   Lab Results  Component Value Date   INSULIN 82.8 (H) 04/30/2020   2. Other specified hypothyroidism Christine Nelson is taking levothyroxine 125 mcg daily.  Treated by Endo.  She says she consistently taking her levothyroxine.  Lab Results  Component Value Date   TSH 3.03 04/11/2020   3. Vitamin D deficiency Christine Nelson's Vitamin D level was 26.9 on  04/30/2020. She is currently taking no vitamin D supplement. She denies nausea, vomiting or muscle weakness.  4. Other iron deficiency anemia Christine Nelson is not a vegetarian.  She does not have a history of weight loss surgery.  She has not been on an iron supplement as her PCP told her.  She endorses constipation with iron supplement at times.  She endorses fatigue and shortness of breath.   CBC Latest Ref Rng & Units 04/30/2020 03/15/2019 02/04/2018  WBC 3.4 - 10.8 x10E3/uL 9.5 8.5 7.7  Hemoglobin 11.1 - 15.9 g/dL 10.6(L) 11.5(L) 11.3  Hematocrit 34.0 - 46.6 % 35.8 36.0 34.7  Platelets 150 - 450 x10E3/uL 418 358.0 333   Lab Results  Component Value Date   FERRITIN 17 02/04/2018   5. At risk for osteoporosis Christine Nelson is at higher risk of osteopenia and osteoporosis due to Vitamin D deficiency.   Assessment/Plan:   1. Type 2 diabetes mellitus with other specified complication, without long-term current use of insulin (HCC) Good blood sugar control is important to decrease the likelihood of diabetic complications such as nephropathy, neuropathy, limb loss, blindness, coronary artery disease, and death. Intensive lifestyle modification including diet, exercise and weight loss are the first line of treatment for diabetes.  Continue prudent nutritional plan, weight loss, and extensive counseling was done.  Management per [name] in Endo.  2. Other specified hypothyroidism Patient with long-standing hypothyroidism, on levothyroxine therapy.  She appears euthyroid. Orders and follow up as documented in patient record.  Management per [name] in Endo.  Continue prudent nutritional plan and weight loss.  Counseling . Good thyroid control is important for overall health. Supratherapeutic thyroid levels are dangerous and will not improve weight loss results. . The correct way to take levothyroxine is fasting, with water, separated by at least 30 minutes from breakfast, and separated by more than 4 hours  from calcium, iron, multivitamins, acid reflux medications (PPIs).   3. Vitamin D deficiency Low Vitamin D level contributes to fatigue and are associated with obesity, breast, and colon cancer. She agrees to start to take prescription Vitamin D @50 ,000 IU every week and will follow-up for routine testing of Vitamin D, at least 2-3 times per year to avoid over-replacement.  Recheck vitamin D level in 3 months.  Continue prudent nutritional plan, weight loss. - Vitamin D, Ergocalciferol, (DRISDOL) 1.25 MG (50000 UNIT) CAPS capsule; Take 1 capsule (50,000 Units total) by mouth every 7 (seven) days.  Dispense: 4 capsule; Refill: 0  4. Other iron deficiency anemia Take iron supplement twice daily and if unable to tolerate from constipation perspective, she will follow-up with her PCP regarding further treatment options.  Orders and follow up as documented in patient record.  Counseling . Iron is essential for our bodies to make red blood cells.  Reasons that someone may be deficient include: an iron-deficient diet (more likely in those following vegan or vegetarian diets), women with heavy menses, patients with GI disorders or poor absorption, patients that have had bariatric surgery, frequent blood donors, patients with cancer, and patients with heart disease.   Christine Nelson foods include dark leafy greens, red and white meats, eggs, seafood, and beans.   . Certain foods and drinks prevent your body from absorbing iron properly. Avoid eating these foods in the same meal as iron-rich foods or with iron supplements. These foods include: coffee, black tea, and red wine; milk, dairy products, and foods that are high in calcium; beans and soybeans; whole grains.  . Constipation can be a side effect of iron supplementation. Increased water and fiber intake are helpful. Water goal: > 2 liters/day. Fiber goal: > 25 grams/day.  - ferrous sulfate 325 (65 FE) MG EC tablet; Take 1 tablet (325 mg total) by mouth in  the morning and at bedtime.  Dispense: 90 tablet; Refill: 0  5. At risk for osteoporosis Christine Nelson was given approximately 15 minutes of osteoporosis prevention counseling today. Christine Nelson is at risk for osteopenia and osteoporosis due to her Vitamin D deficiency. She was encouraged to take her Vitamin D and follow her higher calcium diet and increase strengthening exercise to help strengthen her bones and decrease her risk of osteopenia and osteoporosis.  Repetitive spaced learning was employed today to elicit superior memory formation and behavioral change.  6. Class 3 severe obesity with serious comorbidity and body mass index (BMI) of 50.0 to 59.9 in adult, unspecified obesity type (HCC) Christine Nelson is currently in the action stage of change. As such, her goal is to continue with weight loss efforts. She has agreed to the Category 2 Plan.   Exercise goals: As is.  Behavioral modification strategies: increasing lean protein intake, decreasing simple carbohydrates, increasing water intake, decreasing eating out, no skipping meals, meal planning and cooking strategies, keeping healthy foods in the home and planning for success.  Christine Nelson has agreed to follow-up with our clinic in 2 weeks. She was informed of the importance  of frequent follow-up visits to maximize her success with intensive lifestyle modifications for her multiple health conditions.   Objective:   Blood pressure 106/72, pulse 85, temperature 98.1 F (36.7 C), height 5\' 3"  (1.6 m), weight (!) 320 lb (145.2 kg), SpO2 98 %. Body mass index is 56.69 kg/m.  General: Cooperative, alert, well developed, in no acute distress. HEENT: Conjunctivae and lids unremarkable. Cardiovascular: Regular rhythm.  Lungs: Normal work of breathing. Neurologic: No focal deficits.   Lab Results  Component Value Date   CREATININE 0.75 04/30/2020   BUN 7 04/30/2020   NA 141 04/30/2020   K 4.7 04/30/2020   CL 101 04/30/2020   CO2 22 04/30/2020   Lab Results   Component Value Date   ALT 16 04/30/2020   AST 16 04/30/2020   ALKPHOS 83 04/30/2020   BILITOT <0.2 04/30/2020   Lab Results  Component Value Date   HGBA1C 6.5 04/11/2020   HGBA1C 6.8 (H) 03/15/2019   HGBA1C 5.5 02/04/2018   Lab Results  Component Value Date   INSULIN 82.8 (H) 04/30/2020   Lab Results  Component Value Date   TSH 3.03 04/11/2020   Lab Results  Component Value Date   CHOL 123 04/30/2020   HDL 43 04/30/2020   LDLCALC 68 04/30/2020   TRIG 50 04/30/2020   CHOLHDL 2.9 04/30/2020   Lab Results  Component Value Date   WBC 9.5 04/30/2020   HGB 10.6 (L) 04/30/2020   HCT 35.8 04/30/2020   MCV 81 04/30/2020   PLT 418 04/30/2020   Lab Results  Component Value Date   FERRITIN 17 02/04/2018   Attestation Statements:   Reviewed by clinician on day of visit: allergies, medications, problem list, medical history, surgical history, family history, social history, and previous encounter notes.  I, Water quality scientist, CMA, am acting as Location manager for [employer], DO.  I have reviewed the above documentation for accuracy and completeness, and I agree with the above. Mellody Dance, DO

## 2020-05-21 DIAGNOSIS — F329 Major depressive disorder, single episode, unspecified: Secondary | ICD-10-CM | POA: Diagnosis not present

## 2020-05-21 DIAGNOSIS — F411 Generalized anxiety disorder: Secondary | ICD-10-CM | POA: Diagnosis not present

## 2020-05-29 ENCOUNTER — Ambulatory Visit (INDEPENDENT_AMBULATORY_CARE_PROVIDER_SITE_OTHER): Payer: BC Managed Care – PPO | Admitting: Family Medicine

## 2020-05-30 ENCOUNTER — Encounter: Payer: Self-pay | Admitting: Family Medicine

## 2020-05-30 ENCOUNTER — Other Ambulatory Visit: Payer: Self-pay

## 2020-05-30 ENCOUNTER — Ambulatory Visit (INDEPENDENT_AMBULATORY_CARE_PROVIDER_SITE_OTHER): Payer: BC Managed Care – PPO | Admitting: Family Medicine

## 2020-05-30 VITALS — BP 110/78 | HR 82 | Temp 97.9°F | Ht 63.0 in | Wt 322.0 lb

## 2020-05-30 DIAGNOSIS — Z6841 Body Mass Index (BMI) 40.0 and over, adult: Secondary | ICD-10-CM | POA: Diagnosis not present

## 2020-05-30 DIAGNOSIS — Z0000 Encounter for general adult medical examination without abnormal findings: Secondary | ICD-10-CM | POA: Diagnosis not present

## 2020-05-30 DIAGNOSIS — D219 Benign neoplasm of connective and other soft tissue, unspecified: Secondary | ICD-10-CM | POA: Diagnosis not present

## 2020-05-30 NOTE — Progress Notes (Signed)
Subjective:     Christine Nelson is a 29 y.o. female and is here for a comprehensive physical exam. The patient reports problems - fibroid.  Pt followed by OB/Gyn.  Notes fibroid increasing in size.  Pt contemplating surgery as having increased bleeding.  Pt trying to lose weight.  Seen by weight management clinic.  Pt eating better, eating more than once/day with snacks, meal prepping, and drinking more water.  Pt working out 2-3 x/wk, also has a Clinical research associate.  Will also walk with a friend.  Pt tried weight watchers in the past.  LMP 05/06/2020.  Endorses recent labs with OB/Gyn.  Social History   Socioeconomic History  . Marital status: Married    Spouse name: Manufacturing engineer  . Number of children: Not on file  . Years of education: Not on file  . Highest education level: Not on file  Occupational History  . Occupation: Ship broker  . Occupation: Administrative Asst for [employer]  Tobacco Use  . Smoking status: Never Smoker  . Smokeless tobacco: Never Used  Vaping Use  . Vaping Use: Never used  Substance and Sexual Activity  . Alcohol use: Never  . Drug use: Never  . Sexual activity: Yes  Other Topics Concern  . Not on file  Social History Narrative   Pt is single.  She has a boyfriend.  Pt graduated college with a degree in Biology.  Pt has always wanted to become a physician.  Pt was teaching Biology.  She is now working part-time at [employer].        Social Determinants of Health   Financial Resource Strain:   . Difficulty of Paying Living Expenses: Not on file  Food Insecurity:   . Worried About Charity fundraiser in the Last Year: Not on file  . Ran Out of Food in the Last Year: Not on file  Transportation Needs:   . Lack of Transportation (Medical): Not on file  . Lack of Transportation (Non-Medical): Not on file  Physical Activity:   . Days of Exercise per Week: Not on file  . Minutes of Exercise per Session: Not on file  Stress:   . Feeling of Stress : Not on  file  Social Connections:   . Frequency of Communication with Friends and Family: Not on file  . Frequency of Social Gatherings with Friends and Family: Not on file  . Attends Religious Services: Not on file  . Active Member of Clubs or Organizations: Not on file  . Attends Archivist Meetings: Not on file  . Marital Status: Not on file  Intimate Partner Violence:   . Fear of Current or Ex-Partner: Not on file  . Emotionally Abused: Not on file  . Physically Abused: Not on file  . Sexually Abused: Not on file   Health Maintenance  Topic Date Due  . Hepatitis C Screening  Never done  . URINE MICROALBUMIN  Never done  . COVID-19 Vaccine (1) Never done  . HIV Screening  Never done  . PAP-Cervical Cytology Screening  Never done  . TETANUS/TDAP  11/05/2018  . INFLUENZA VACCINE  05/05/2020  . PAP SMEAR-Modifier  02/01/2021    The following portions of the patient's history were reviewed and updated as appropriate: allergies, current medications, past family history, past medical history, past social history, past surgical history and problem list.  Review of Systems Pertinent items noted in HPI and remainder of comprehensive ROS otherwise negative.   Objective:  BP 110/78 (BP Location: Left Wrist, Patient Position: Sitting, Cuff Size: Large)   Pulse 82   Temp 97.9 F (36.6 C) (Oral)   Ht 5\' 3"  (1.6 m)   Wt (!) 322 lb (146.1 kg)   LMP 05/06/2020 (Exact Date)   SpO2 98%   BMI 57.04 kg/m  General appearance: alert, cooperative and no distress Head: Normocephalic, without obvious abnormality, atraumatic Eyes: conjunctivae/corneas clear. PERRL, EOM's intact. Fundi benign. Ears: normal TM's and external ear canals both ears Nose: Nares normal. Septum midline. Mucosa normal. No drainage or sinus tenderness. Throat: lips, mucosa, and tongue normal; teeth and gums normal Neck: no adenopathy, no carotid bruit, no JVD, supple, symmetrical, trachea midline and thyroid not  enlarged, symmetric, no tenderness/mass/nodules Lungs: clear to auscultation bilaterally Heart: regular rate and rhythm, S1, S2 normal, no murmur, click, rub or gallop Abdomen: soft, non-tender; bowel sounds normal; no masses,  no organomegaly Extremities: extremities normal, atraumatic, no cyanosis or edema Pulses: 2+ and symmetric Skin: Skin color, texture, turgor normal. No rashes or lesions Lymph nodes: Cervical, supraclavicular, and axillary nodes normal. Neurologic: Alert and oriented X 3, normal strength and tone. Normal symmetric reflexes. Normal coordination and gait    Assessment:    Healthy female exam with fibroid.     Plan:     Anticipatory guidance given including wearing seatbelts, smoke detectors in the home, increasing physical activity, increasing p.o. intake of water and vegetables. -Labs from 04/30/2020 were reviewed. -PHQ 9 score 1 -GAD 7 score 0 -pap completed with OB/Gyn -Given handouts -Next CPE in 1 year. See After Visit Summary for Counseling Recommendations    Fibroid -Pelvic MRI 05/13/20 with 11.3 x 7.4 x 8.3 cm submucosal fibroid slightly increase in size from previous exam. -continue f/u with OB/Gyn  Class 3 severe obesity due to excess calories with serious comorbidity and body mass index (BMI) of 50.0 to 59.9 in adult [facility])  -continue lifestyle modifications  -continue f/u with weight management -discussed various gastric surgery options.  Will place referral to gen surg for consultation - Plan: Ambulatory referral to General Surgery  F/u prn  Grier Mitts, MD

## 2020-05-30 NOTE — Patient Instructions (Signed)
Preventive Care 21-27 Years Old, Female Preventive care refers to visits with your health care provider and lifestyle choices that can promote health and wellness. This includes:  A yearly physical exam. This may also be called an annual well check.  Regular dental visits and eye exams.  Immunizations.  Screening for certain conditions.  Healthy lifestyle choices, such as eating a healthy diet, getting regular exercise, not using drugs or products that contain nicotine and tobacco, and limiting alcohol use. What can I expect for my preventive care visit? Physical exam Your health care provider will check your:  Height and weight. This may be used to calculate body mass index (BMI), which tells if you are at a healthy weight.  Heart rate and blood pressure.  Skin for abnormal spots. Counseling Your health care provider may ask you questions about your:  Alcohol, tobacco, and drug use.  Emotional well-being.  Home and relationship well-being.  Sexual activity.  Eating habits.  Work and work environment.  Method of birth control.  Menstrual cycle.  Pregnancy history. What immunizations do I need?  Influenza (flu) vaccine  This is recommended every year. Tetanus, diphtheria, and pertussis (Tdap) vaccine  You may need a Td booster every 10 years. Varicella (chickenpox) vaccine  You may need this if you have not been vaccinated. Human papillomavirus (HPV) vaccine  If recommended by your health care provider, you may need three doses over 6 months. Measles, mumps, and rubella (MMR) vaccine  You may need at least one dose of MMR. You may also need a second dose. Meningococcal conjugate (MenACWY) vaccine  One dose is recommended if you are age 19-21 years and a first-year college student living in a residence hall, or if you have one of several medical conditions. You may also need additional booster doses. Pneumococcal conjugate (PCV13) vaccine  You may need  this if you have certain conditions and were not previously vaccinated. Pneumococcal polysaccharide (PPSV23) vaccine  You may need one or two doses if you smoke cigarettes or if you have certain conditions. Hepatitis A vaccine  You may need this if you have certain conditions or if you travel or work in places where you may be exposed to hepatitis A. Hepatitis B vaccine  You may need this if you have certain conditions or if you travel or work in places where you may be exposed to hepatitis B. Haemophilus influenzae type b (Hib) vaccine  You may need this if you have certain conditions. You may receive vaccines as individual doses or as more than one vaccine together in one shot (combination vaccines). Talk with your health care provider about the risks and benefits of combination vaccines. What tests do I need?  Blood tests  Lipid and cholesterol levels. These may be checked every 5 years starting at age 20.  Hepatitis C test.  Hepatitis B test. Screening  Diabetes screening. This is done by checking your blood sugar (glucose) after you have not eaten for a while (fasting).  Sexually transmitted disease (STD) testing.  BRCA-related cancer screening. This may be done if you have a family history of breast, ovarian, tubal, or peritoneal cancers.  Pelvic exam and Pap test. This may be done every 3 years starting at age 21. Starting at age 30, this may be done every 5 years if you have a Pap test in combination with an HPV test. Talk with your health care provider about your test results, treatment options, and if necessary, the need for more tests.   Follow these instructions at home: Eating and drinking   Eat a diet that includes fresh fruits and vegetables, whole grains, lean protein, and low-fat dairy.  Take vitamin and mineral supplements as recommended by your health care provider.  Do not drink alcohol if: ? Your health care provider tells you not to drink. ? You are  pregnant, may be pregnant, or are planning to become pregnant.  If you drink alcohol: ? Limit how much you have to 0-1 drink a day. ? Be aware of how much alcohol is in your drink. In the [place] one drink equals one 12 oz bottle of beer (355 mL), one 5 oz glass of wine (148 mL), or one 1 oz glass of hard liquor (44 mL). Lifestyle  Take daily care of your teeth and gums.  Stay active. Exercise for at least 30 minutes on 5 or more days each week.  Do not use any products that contain nicotine or tobacco, such as cigarettes, e-cigarettes, and chewing tobacco. If you need help quitting, ask your health care provider.  If you are sexually active, practice safe sex. Use a condom or other form of birth control (contraception) in order to prevent pregnancy and STIs (sexually transmitted infections). If you plan to become pregnant, see your health care provider for a preconception visit. What's next?  Visit your health care provider once a year for a well check visit.  Ask your health care provider how often you should have your eyes and teeth checked.  Stay up to date on all vaccines. This information is not intended to replace advice given to you by your health care provider. Make sure you discuss any questions you have with your health care provider. Document Revised: 06/02/2018 Document Reviewed: 06/02/2018 Elsevier Patient Education  [employer].  Bariatric Surgery Information Bariatric surgery, also called weight loss surgery, is a procedure that helps you lose weight. You may consider, or your health care provider may suggest, bariatric surgery if:  You are severely obese and have been unable to lose weight through diet and exercise.  You have health problems related to obesity, such as: ? Type 2 diabetes. ? Heart disease. ? Lung disease. How does bariatric surgery help me lose weight? Bariatric surgery helps you lose weight by:  Decreasing how much food your body absorbs.  This is done by closing off part of your stomach to make it smaller. This restricts the amount of food your stomach can hold.  Changing your body's regular digestive process so that food bypasses the parts of your body that absorb calories and nutrients. If you decide to have bariatric surgery, it is important to continue to eat a healthy diet and exercise regularly after the surgery. What are the different kinds of bariatric surgery? There are two kinds of bariatric surgeries:  Restrictive surgery. This procedure makes your stomach smaller. It does not change your digestive process. The smaller the size of your new stomach, the less food you can eat. There are different types of restrictive surgeries.  Malabsorptive surgery. This procedure makes your stomach smaller and alters your digestive process so that your body processes less calories and nutrients. These are the most common kind of bariatric surgery. There are different types of malabsorptive surgeries. What are the different types of restrictive surgery? Adjustable Gastric Banding In this procedure, an inflatable band is placed around your stomach near the upper end. This makes the passageway for food into the rest of your stomach much smaller. The  band can be adjusted, making it tighter or looser, by filling it with salt solution. Your surgeon can adjust the band based on how you are feeling and how much weight you are losing. The band can be removed in the future. This requires another surgery. Sleeve Gastrectomy In this procedure, your stomach is made smaller. This is done by surgically removing a large part of your stomach. When your stomach is smaller, you feel full more quickly and reduce how much you eat. What are the different types of malabsorptive surgery?  Roux-en-Y Gastric Bypass (RGB) This is the most common weight loss surgery. In this procedure, a small stomach pouch (gastric pouch) is created in the upper part of your  stomach. Next, this gastric pouch is attached directly to the middle part of your small intestine. The farther down your small intestine the new connection is made, the fewer calories and nutrients you will absorb. This surgery has the highest rate of complications. Biliopancreatic Diversion with Duodenal Switch (BPD/DS) This is a multi-step procedure. First, a large part of your stomach is removed, making your stomach smaller. Next, this smaller stomach is attached to the lower part of your small intestine. Like the RGB surgery, you absorb fewer calories and nutrients the farther down your small intestine the attachment is made. What are the risks of bariatric surgery? As with any surgical procedure, each type of bariatric surgery has its own risks. These risks also depend on your age, your overall health, and any other medical conditions you may have. When deciding on bariatric surgery, it is very important to:  Talk to your health care provider and choose the surgery that is best for you.  Ask your health care provider about specific risks for the surgery you choose. Generally, the risks of bariatric surgery include:  Infection.  Bleeding.  Not getting enough nutrients from food (nutritional deficiencies).  Failure of the device or procedure. This may require another surgery to correct the problem. Where to find more information  American Society for Metabolic & Bariatric Surgery: www.asmbs.org  Weight-control Information Network (WIN): win.AmenCredit.is Summary  Bariatric surgery, also called weight loss surgery, is a procedure that helps you lose weight.  This surgery may be recommended if you have diabetes, heart disease, or lung disease.  Generally, risks of bariatric surgery include infection, bleeding, and failure of the surgery or device, which may require another surgery to correct the problem. This information is not intended to replace advice given to you by your health  care provider. Make sure you discuss any questions you have with your health care provider. Document Revised: 01/10/2019 Document Reviewed: 10/26/2016 Elsevier Patient Education  2020 [employer].  Exercising to Lose Weight Exercise is structured, repetitive physical activity to improve fitness and health. Getting regular exercise is important for everyone. It is especially important if you are overweight. Being overweight increases your risk of heart disease, stroke, diabetes, high blood pressure, and several types of cancer. Reducing your calorie intake and exercising can help you lose weight. Exercise is usually categorized as moderate or vigorous intensity. To lose weight, most people need to do a certain amount of moderate-intensity or vigorous-intensity exercise each week. Moderate-intensity exercise  Moderate-intensity exercise is any activity that gets you moving enough to burn at least three times more energy (calories) than if you were sitting. Examples of moderate exercise include:  Walking a mile in 15 minutes.  Doing light yard work.  Biking at an easy pace. Most people should get  at least 150 minutes (2 hours and 30 minutes) a week of moderate-intensity exercise to maintain their body weight. Vigorous-intensity exercise Vigorous-intensity exercise is any activity that gets you moving enough to burn at least six times more calories than if you were sitting. When you exercise at this intensity, you should be working hard enough that you are not able to carry on a conversation. Examples of vigorous exercise include:  Running.  Playing a team sport, such as football, basketball, and soccer.  Jumping rope. Most people should get at least 75 minutes (1 hour and 15 minutes) a week of vigorous-intensity exercise to maintain their body weight. How can exercise affect me? When you exercise enough to burn more calories than you eat, you lose weight. Exercise also reduces body fat  and builds muscle. The more muscle you have, the more calories you burn. Exercise also:  Improves mood.  Reduces stress and tension.  Improves your overall fitness, flexibility, and endurance.  Increases bone strength. The amount of exercise you need to lose weight depends on:  Your age.  The type of exercise.  Any health conditions you have.  Your overall physical ability. Talk to your health care provider about how much exercise you need and what types of activities are safe for you. What actions can I take to lose weight? Nutrition   Make changes to your diet as told by your health care provider or diet and nutrition specialist (dietitian). This may include: ? Eating fewer calories. ? Eating more protein. ? Eating less unhealthy fats. ? Eating a diet that includes fresh fruits and vegetables, whole grains, low-fat dairy products, and lean protein. ? Avoiding foods with added fat, salt, and sugar.  Drink plenty of water while you exercise to prevent dehydration or heat stroke. Activity  Choose an activity that you enjoy and set realistic goals. Your health care provider can help you make an exercise plan that works for you.  Exercise at a moderate or vigorous intensity most days of the week. ? The intensity of exercise may vary from person to person. You can tell how intense a workout is for you by paying attention to your breathing and heartbeat. Most people will notice their breathing and heartbeat get faster with more intense exercise.  Do resistance training twice each week, such as: ? Push-ups. ? Sit-ups. ? Lifting weights. ? Using resistance bands.  Getting short amounts of exercise can be just as helpful as long structured periods of exercise. If you have trouble finding time to exercise, try to include exercise in your daily routine. ? Get up, stretch, and walk around every 30 minutes throughout the day. ? Go for a walk during your lunch break. ? Park your car  farther away from your destination. ? If you take public transportation, get off one stop early and walk the rest of the way. ? Make phone calls while standing up and walking around. ? Take the stairs instead of elevators or escalators.  Wear comfortable clothes and shoes with good support.  Do not exercise so much that you hurt yourself, feel dizzy, or get very short of breath. Where to find more information  [place] Department of Health and Human Services: BondedCompany.at  Centers for Disease Control and Prevention (CDC): http://www.wolf.info/ Contact a health care provider:  Before starting a new exercise program.  If you have questions or concerns about your weight.  If you have a medical problem that keeps you from exercising. Get help right away  if you have any of the following while exercising:  Injury.  Dizziness.  Difficulty breathing or shortness of breath that does not go away when you stop exercising.  Chest pain.  Rapid heartbeat. Summary  Being overweight increases your risk of heart disease, stroke, diabetes, high blood pressure, and several types of cancer.  Losing weight happens when you burn more calories than you eat.  Reducing the amount of calories you eat in addition to getting regular moderate or vigorous exercise each week helps you lose weight. This information is not intended to replace advice given to you by your health care provider. Make sure you discuss any questions you have with your health care provider. Document Revised: 10/04/2017 Document Reviewed: 10/04/2017 Elsevier Patient Education  2020 [employer].  Calorie Counting for [employer] Loss Calories are units of energy. Your body needs a certain amount of calories from food to keep you going throughout the day. When you eat more calories than your body needs, your body stores the extra calories as fat. When you eat fewer calories than your body needs, your body burns fat to get the energy it  needs. Calorie counting means keeping track of how many calories you eat and drink each day. Calorie counting can be helpful if you need to lose weight. If you make sure to eat fewer calories than your body needs, you should lose weight. Ask your health care provider what a healthy weight is for you. For calorie counting to work, you will need to eat the right number of calories in a day in order to lose a healthy amount of weight per week. A dietitian can help you determine how many calories you need in a day and will give you suggestions on how to reach your calorie goal.  A healthy amount of weight to lose per week is usually 1-2 lb (0.5-0.9 kg). This usually means that your daily calorie intake should be reduced by 500-750 calories.  Eating 1,200 - 1,500 calories per day can help most women lose weight.  Eating 1,500 - 1,800 calories per day can help most men lose weight. What is my plan? My goal is to have __________ calories per day. If I have this many calories per day, I should lose around __________ pounds per week. What do I need to know about calorie counting? In order to meet your daily calorie goal, you will need to:  Find out how many calories are in each food you would like to eat. Try to do this before you eat.  Decide how much of the food you plan to eat.  Write down what you ate and how many calories it had. Doing this is called keeping a food log. To successfully lose weight, it is important to balance calorie counting with a healthy lifestyle that includes regular activity. Aim for 150 minutes of moderate exercise (such as walking) or 75 minutes of vigorous exercise (such as running) each week. Where do I find calorie information?  The number of calories in a food can be found on a Nutrition Facts label. If a food does not have a Nutrition Facts label, try to look up the calories online or ask your dietitian for help. Remember that calories are listed per serving. If you  choose to have more than one serving of a food, you will have to multiply the calories per serving by the amount of servings you plan to eat. For example, the label on a package of bread might  say that a serving size is 1 slice and that there are 90 calories in a serving. If you eat 1 slice, you will have eaten 90 calories. If you eat 2 slices, you will have eaten 180 calories. How do I keep a food log? Immediately after each meal, record the following information in your food log:  What you ate. Don't forget to include toppings, sauces, and other extras on the food.  How much you ate. This can be measured in cups, ounces, or number of items.  How many calories each food and drink had.  The total number of calories in the meal. Keep your food log near you, such as in a small notebook in your pocket, or use a mobile app or website. Some programs will calculate calories for you and show you how many calories you have left for the day to meet your goal. What are some calorie counting tips?   Use your calories on foods and drinks that will fill you up and not leave you hungry: ? Some examples of foods that fill you up are nuts and nut butters, vegetables, lean proteins, and high-fiber foods like whole grains. High-fiber foods are foods with more than 5 g fiber per serving. ? Drinks such as sodas, specialty coffee drinks, alcohol, and juices have a lot of calories, yet do not fill you up.  Eat nutritious foods and avoid empty calories. Empty calories are calories you get from foods or beverages that do not have many vitamins or protein, such as candy, sweets, and soda. It is better to have a nutritious high-calorie food (such as an avocado) than a food with few nutrients (such as a bag of chips).  Know how many calories are in the foods you eat most often. This will help you calculate calorie counts faster.  Pay attention to calories in drinks. Low-calorie drinks include water and unsweetened  drinks.  Pay attention to nutrition labels for "low fat" or "fat free" foods. These foods sometimes have the same amount of calories or more calories than the full fat versions. They also often have added sugar, starch, or salt, to make up for flavor that was removed with the fat.  Find a way of tracking calories that works for you. Get creative. Try different apps or programs if writing down calories does not work for you. What are some portion control tips?  Know how many calories are in a serving. This will help you know how many servings of a certain food you can have.  Use a measuring cup to measure serving sizes. You could also try weighing out portions on a kitchen scale. With time, you will be able to estimate serving sizes for some foods.  Take some time to put servings of different foods on your favorite plates, bowls, and cups so you know what a serving looks like.  Try not to eat straight from a bag or box. Doing this can lead to overeating. Put the amount you would like to eat in a cup or on a plate to make sure you are eating the right portion.  Use smaller plates, glasses, and bowls to prevent overeating.  Try not to multitask (for example, watch TV or use your computer) while eating. If it is time to eat, sit down at a table and enjoy your food. This will help you to know when you are full. It will also help you to be aware of what you are eating and how much  you are eating. What are tips for following this plan? Reading food labels  Check the calorie count compared to the serving size. The serving size may be smaller than what you are used to eating.  Check the source of the calories. Make sure the food you are eating is high in vitamins and protein and low in saturated and trans fats. Shopping  Read nutrition labels while you shop. This will help you make healthy decisions before you decide to purchase your food.  Make a grocery list and stick to it. Cooking  Try to  cook your favorite foods in a healthier way. For example, try baking instead of frying.  Use low-fat dairy products. Meal planning  Use more fruits and vegetables. Half of your plate should be fruits and vegetables.  Include lean proteins like poultry and fish. How do I count calories when eating out?  Ask for smaller portion sizes.  Consider sharing an entree and sides instead of getting your own entree.  If you get your own entree, eat only half. Ask for a box at the beginning of your meal and put the rest of your entree in it so you are not tempted to eat it.  If calories are listed on the menu, choose the lower calorie options.  Choose dishes that include vegetables, fruits, whole grains, low-fat dairy products, and lean protein.  Choose items that are boiled, broiled, grilled, or steamed. Stay away from items that are buttered, battered, fried, or served with cream sauce. Items labeled "crispy" are usually fried, unless stated otherwise.  Choose water, low-fat milk, unsweetened iced tea, or other drinks without added sugar. If you want an alcoholic beverage, choose a lower calorie option such as a glass of wine or light beer.  Ask for dressings, sauces, and syrups on the side. These are usually high in calories, so you should limit the amount you eat.  If you want a salad, choose a garden salad and ask for grilled meats. Avoid extra toppings like bacon, cheese, or fried items. Ask for the dressing on the side, or ask for olive oil and vinegar or lemon to use as dressing.  Estimate how many servings of a food you are given. For example, a serving of cooked rice is  cup or about the size of half a baseball. Knowing serving sizes will help you be aware of how much food you are eating at restaurants. The list below tells you how big or small some common portion sizes are based on everyday objects: ? 1 oz--4 stacked dice. ? 3 oz--1 deck of cards. ? 1 tsp--1 die. ? 1 Tbsp-- a  ping-pong ball. ? 2 Tbsp--1 ping-pong ball. ?  cup-- baseball. ? 1 cup--1 baseball. Summary  Calorie counting means keeping track of how many calories you eat and drink each day. If you eat fewer calories than your body needs, you should lose weight.  A healthy amount of weight to lose per week is usually 1-2 lb (0.5-0.9 kg). This usually means reducing your daily calorie intake by 500-750 calories.  The number of calories in a food can be found on a Nutrition Facts label. If a food does not have a Nutrition Facts label, try to look up the calories online or ask your dietitian for help.  Use your calories on foods and drinks that will fill you up, and not on foods and drinks that will leave you hungry.  Use smaller plates, glasses, and bowls to prevent overeating.  This information is not intended to replace advice given to you by your health care provider. Make sure you discuss any questions you have with your health care provider. Document Revised: 06/10/2018 Document Reviewed: 08/21/2016 Elsevier Patient Education  [place].

## 2020-06-04 DIAGNOSIS — F329 Major depressive disorder, single episode, unspecified: Secondary | ICD-10-CM | POA: Diagnosis not present

## 2020-06-04 DIAGNOSIS — F411 Generalized anxiety disorder: Secondary | ICD-10-CM | POA: Diagnosis not present

## 2020-06-05 ENCOUNTER — Encounter: Payer: Self-pay | Admitting: Family Medicine

## 2020-06-16 DIAGNOSIS — R05 Cough: Secondary | ICD-10-CM | POA: Diagnosis not present

## 2020-06-18 DIAGNOSIS — F329 Major depressive disorder, single episode, unspecified: Secondary | ICD-10-CM | POA: Diagnosis not present

## 2020-06-18 DIAGNOSIS — F411 Generalized anxiety disorder: Secondary | ICD-10-CM | POA: Diagnosis not present

## 2020-06-19 ENCOUNTER — Ambulatory Visit (INDEPENDENT_AMBULATORY_CARE_PROVIDER_SITE_OTHER): Payer: BC Managed Care – PPO | Admitting: Family Medicine

## 2020-06-27 DIAGNOSIS — D259 Leiomyoma of uterus, unspecified: Secondary | ICD-10-CM | POA: Diagnosis not present

## 2020-06-28 ENCOUNTER — Other Ambulatory Visit: Payer: Self-pay | Admitting: Obstetrics and Gynecology

## 2020-06-28 DIAGNOSIS — D259 Leiomyoma of uterus, unspecified: Secondary | ICD-10-CM

## 2020-07-01 DIAGNOSIS — Z20828 Contact with and (suspected) exposure to other viral communicable diseases: Secondary | ICD-10-CM | POA: Diagnosis not present

## 2020-07-03 DIAGNOSIS — F329 Major depressive disorder, single episode, unspecified: Secondary | ICD-10-CM | POA: Diagnosis not present

## 2020-07-04 ENCOUNTER — Other Ambulatory Visit (HOSPITAL_COMMUNITY)
Admission: RE | Admit: 2020-07-04 | Discharge: 2020-07-04 | Disposition: A | Discharge status: Home / Self Care | Payer: BC Managed Care – PPO | Source: Ambulatory Visit | Attending: Family Medicine | Admitting: Family Medicine

## 2020-07-04 ENCOUNTER — Ambulatory Visit (INDEPENDENT_AMBULATORY_CARE_PROVIDER_SITE_OTHER): Payer: BC Managed Care – PPO | Admitting: Family Medicine

## 2020-07-04 ENCOUNTER — Other Ambulatory Visit: Payer: Self-pay

## 2020-07-04 ENCOUNTER — Encounter: Payer: Self-pay | Admitting: Family Medicine

## 2020-07-04 VITALS — BP 128/82 | HR 90 | Temp 98.0°F | Wt 323.6 lb

## 2020-07-04 DIAGNOSIS — N760 Acute vaginitis: Secondary | ICD-10-CM

## 2020-07-04 DIAGNOSIS — B373 Candidiasis of vulva and vagina: Secondary | ICD-10-CM | POA: Diagnosis not present

## 2020-07-04 DIAGNOSIS — B9689 Other specified bacterial agents as the cause of diseases classified elsewhere: Secondary | ICD-10-CM | POA: Diagnosis not present

## 2020-07-04 DIAGNOSIS — M25531 Pain in right wrist: Secondary | ICD-10-CM

## 2020-07-04 DIAGNOSIS — M79672 Pain in left foot: Secondary | ICD-10-CM

## 2020-07-04 DIAGNOSIS — M25532 Pain in left wrist: Secondary | ICD-10-CM | POA: Diagnosis not present

## 2020-07-04 NOTE — Progress Notes (Signed)
Subjective:    Patient ID: Christine Nelson, female    DOB: 1991/05/30, 29 y.o.   MRN: [id number]  No chief complaint on file.   HPI Patient was seen today for ongoing concern.  Pt endorses left ankle achiness times several months.  Patient describes sensation as an ache "like carpal tunnel in foot".  At times patient endorses soreness in ball of left foot that is better with walking and occasional soreness in Achilles tendon.  Patient denies having to take medication for this sensation.  Patient denies numbness, tingling.  Patient last noticed sensation while flying.  Pt was able to move her ankle around for relief.  Pt also notes h/o doing office work.  Will have edema and soreness in b/l wrist.  Sensation medication moves into forearms.  Pt endorses vaginal irritation/pruritus.  Denies d/c, lesions, erythema, or edema.  Patient using Dove sensitive soap.  pt states the sensation is not on the outer genitalia but inside.  Pt eating mostly vegetarian, drinking more water, and exercising regularly.  Pt inquires about gastric sleeve referral.    Past Medical History:  Diagnosis Date  . Diabetes mellitus, type II ([place])    Hgb A1c in 03/2019 was 6.8%  . Fibroids   . History of prediabetes   . Hypothyroid   . Hypothyroidism   . Obesity   . PCOS (polycystic ovarian syndrome)   . Uterine leiomyoma   . Vitamin D deficiency     No Known Allergies  ROS General: Denies fever, chills, night sweats, changes in weight, changes in appetite HEENT: Denies headaches, ear pain, changes in vision, rhinorrhea, sore throat CV: Denies CP, palpitations, SOB, orthopnea Pulm: Denies SOB, cough, wheezing GI: Denies abdominal pain, nausea, vomiting, diarrhea, constipation GU: Denies dysuria, hematuria, frequency, vaginal discharge  + vaginal pruritus Msk: Denies muscle cramps, joint pains + left foot and ankle ache, bilateral wrist soreness Neuro: Denies weakness, numbness, tingling Skin: Denies  rashes, bruising Psych: Denies depression, anxiety, hallucinations    Objective:    Blood pressure 128/82, pulse 90, temperature 98 F (36.7 C), temperature source Oral, weight (!) 323 lb 9.6 oz (146.8 kg), SpO2 97 %.   Gen. Pleasant, well-nourished, in no distress, obese, normal affect   HEENT: [place]/AT, face symmetric, conjunctiva clear, no scleral icterus, PERRLA, EOMI, nares patent without drainage Lungs: no accessory muscle use Cardiovascular: RRR, no peripheral edema.  DP and PT pulses 2+ in LLE Musculoskeletal: Negative Tinel and Phalen's of bilateral wrist.  Unable to predict reproduce headache in bilateral wrist with tapping on bilateral medial and lateral epicondyles.  No TTP of left foot.  no deformities, no cyanosis or clubbing, normal tone.  No LE edema Neuro:  A&Ox3, CN II-XII intact, normal gait Skin:  Warm, no lesions/ rash   Wt Readings from Last 3 Encounters:  05/30/20 (!) 322 lb (146.1 kg)  05/14/20 (!) 320 lb (145.2 kg)  04/30/20 (!) 322 lb (146.1 kg)    Lab Results  Component Value Date   WBC 9.5 04/30/2020   HGB 10.6 (L) 04/30/2020   HCT 35.8 04/30/2020   PLT 418 04/30/2020   GLUCOSE 98 04/30/2020   CHOL 123 04/30/2020   TRIG 50 04/30/2020   HDL 43 04/30/2020   LDLCALC 68 04/30/2020   ALT 16 04/30/2020   AST 16 04/30/2020   NA 141 04/30/2020   K 4.7 04/30/2020   CL 101 04/30/2020   CREATININE 0.75 04/30/2020   BUN 7 04/30/2020   CO2 22 04/30/2020  TSH 3.03 04/11/2020   HGBA1C 6.5 04/11/2020    Assessment/Plan:  Acute vaginitis  -Discussed using gentlle soaps, lotions, detergents -Given handout -We will send in medication if needed based on lab results - Plan: Cervicovaginal ancillary only  Bilateral wrist pain -Discussed supportive care -Given handout on carpal tunnel prevention -Continue to monitor  Foot pain, left -Discussed possible causes including nerve compression, electrolyte deficiency, heel spur -Patient encouraged to take  breaks from prolonged sitting, use compression hose.  Discussed stretching exercises -continue supportive care -Offered labs this visit however patient declines. -For continued or worsening symptoms obtain imaging and labs   Patient updated on status of referral to general surgery for gastric sleeve consultation.  Referral placed 9/1, [facility] to contact patient.  F/u as needed  Grier Mitts, MD

## 2020-07-04 NOTE — Patient Instructions (Addendum)
Foot Pain Many things can cause foot pain. Some common causes are:  An injury.  A sprain.  Arthritis.  Blisters.  Bunions. Follow these instructions at home: Managing pain, stiffness, and swelling If directed, put ice on the painful area:  Put ice in a plastic bag.  Place a towel between your skin and the bag.  Leave the ice on for 20 minutes, 2-3 times a day.  Activity  Do not stand or walk for long periods.  Return to your normal activities as told by your health care provider. Ask your health care provider what activities are safe for you.  Do stretches to relieve foot pain and stiffness as told by your health care provider.  Do not lift anything that is heavier than 10 lb (4.5 kg), or the limit that you are told, until your health care provider says that it is safe. Lifting a lot of weight can put added pressure on your feet. Lifestyle  Wear comfortable, supportive shoes that fit you well. Do not wear high heels.  Keep your feet clean and dry. General instructions  Take over-the-counter and prescription medicines only as told by your health care provider.  Rub your foot gently.  Pay attention to any changes in your symptoms.  Keep all follow-up visits as told by your health care provider. This is important. Contact a health care provider if:  Your pain does not get better after a few days of self-care.  Your pain gets worse.  You cannot stand on your foot. Get help right away if:  Your foot is numb or tingling.  Your foot or toes are swollen.  Your foot or toes turn white or blue.  You have warmth and redness along your foot. Summary  Common causes of foot pain are injury, sprain, arthritis, blisters or bunions.  Ice, medicines, and comfortable shoes may help foot pain.  Contact your health care provider if your pain does not get better after a few days of self-care. This information is not intended to replace advice given to you by your health  care provider. Make sure you discuss any questions you have with your health care provider. Document Revised: 07/07/2018 Document Reviewed: 07/07/2018 Elsevier Patient Education  [place].  Vaginitis Vaginitis is a condition in which the vaginal tissue swells and becomes red (inflamed). This condition is most often caused by a change in the normal balance of bacteria and yeast that live in the vagina. This change causes an overgrowth of certain bacteria or yeast, which causes the inflammation. There are different types of vaginitis, but the most common types are:  Bacterial vaginosis.  Yeast infection (candidiasis).  Trichomoniasis vaginitis. This is a sexually transmitted disease (STD).  Viral vaginitis.  Atrophic vaginitis.  Allergic vaginitis. What are the causes? The cause of this condition depends on the type of vaginitis. It can be caused by:  Bacteria (bacterial vaginosis).  Yeast, which is a fungus (yeast infection).  A parasite (trichomoniasis vaginitis).  A virus (viral vaginitis).  Low hormone levels (atrophic vaginitis). Low hormone levels can occur during pregnancy, breastfeeding, or after menopause.  Irritants, such as bubble baths, scented tampons, and feminine sprays (allergic vaginitis). Other factors can change the normal balance of the yeast and bacteria that live in the vagina. These include:  Antibiotic medicines.  Poor hygiene.  Diaphragms, vaginal sponges, spermicides, birth control pills, and intrauterine devices (IUD).  Sex.  Infection.  Uncontrolled diabetes.  A weakened defense (immune) system. What increases the  risk? This condition is more likely to develop in women who:  Smoke.  Use vaginal douches, scented tampons, or scented sanitary pads.  Wear tight-fitting pants.  Wear thong underwear.  Use oral birth control pills or an IUD.  Have sex without a condom.  Have multiple sex partners.  Have an STD.  Frequently  use the spermicide nonoxynol-9.  Eat lots of foods high in sugar.  Have uncontrolled diabetes.  Have low estrogen levels.  Have a weakened immune system from an immune disorder or medical treatment.  Are pregnant or breastfeeding. What are the signs or symptoms? Symptoms vary depending on the cause of the vaginitis. Common symptoms include:  Abnormal vaginal discharge. ? The discharge is white, gray, or yellow with bacterial vaginosis. ? The discharge is thick, white, and cheesy with a yeast infection. ? The discharge is frothy and yellow or greenish with trichomoniasis.  A bad vaginal smell. The smell is fishy with bacterial vaginosis.  Vaginal itching, pain, or swelling.  Sex that is painful.  Pain or burning when urinating. Sometimes there are no symptoms. How is this diagnosed? This condition is diagnosed based on your symptoms and medical history. A physical exam, including a pelvic exam, will also be done. You may also have other tests, including:  Tests to determine the pH level (acidity or alkalinity) of your vagina.  A whiff test, to assess the odor that results when a sample of your vaginal discharge is mixed with a potassium hydroxide solution.  Tests of vaginal fluid. A sample will be examined under a microscope. How is this treated? Treatment varies depending on the type of vaginitis you have. Your treatment may include:  Antibiotic creams or pills to treat bacterial vaginosis and trichomoniasis.  Antifungal medicines, such as vaginal creams or suppositories, to treat a yeast infection.  Medicine to ease discomfort if you have viral vaginitis. Your sexual partner should also be treated.  Estrogen delivered in a cream, pill, suppository, or vaginal ring to treat atrophic vaginitis. If vaginal dryness occurs, lubricants and moisturizing creams may help. You may need to avoid scented soaps, sprays, or douches.  Stopping use of a product that is causing allergic  vaginitis. Then using a vaginal cream to treat the symptoms. Follow these instructions at home: Lifestyle  Keep your genital area clean and dry. Avoid soap, and only rinse the area with water.  Do not douche or use tampons until your health care provider says it is okay to do so. Use sanitary pads, if needed.  Do not have sex until your health care provider approves. When you can return to sex, practice safe sex and use condoms.  Wipe from front to back. This avoids the spread of bacteria from the rectum to the vagina. General instructions  Take over-the-counter and prescription medicines only as told by your health care provider.  If you were prescribed an antibiotic medicine, take or use it as told by your health care provider. Do not stop taking or using the antibiotic even if you start to feel better.  Keep all follow-up visits as told by your health care provider. This is important. How is this prevented?  Use mild, non-scented products. Do not use things that can irritate the vagina, such as fabric softeners. Avoid the following products if they are scented: ? Feminine sprays. ? Detergents. ? Tampons. ? Feminine hygiene products. ? Soaps or bubble baths.  Let air reach your genital area. ? Wear cotton underwear to reduce moisture buildup. ?  Avoid wearing underwear while you sleep. ? Avoid wearing tight pants and underwear or nylons without a cotton panel. ? Avoid wearing thong underwear.  Take off any wet clothing, such as bathing suits, as soon as possible.  Practice safe sex and use condoms. Contact a health care provider if:  You have abdominal pain.  You have a fever.  You have symptoms that last for more than 2-3 days. Get help right away if:  You have a fever and your symptoms suddenly get worse. Summary  Vaginitis is a condition in which the vaginal tissue becomes inflamed.This condition is most often caused by a change in the normal balance of bacteria  and yeast that live in the vagina.  Treatment varies depending on the type of vaginitis you have.  Do not douche, use tampons , or have sex until your health care provider approves. When you can return to sex, practice safe sex and use condoms. This information is not intended to replace advice given to you by your health care provider. Make sure you discuss any questions you have with your health care provider. Document Revised: 09/03/2017 Document Reviewed: 10/27/2016 Elsevier Patient Education  [place].  Wrist Pain, Adult There are many things that can cause wrist pain. Some common causes include:  An injury to the wrist area, such as a sprain, strain, or fracture.  Overuse of the joint.  A condition that causes increased pressure on a nerve in the wrist (carpal tunnel syndrome).  Wear and tear of the joints that occurs with aging (osteoarthritis).  A variety of other types of arthritis. Sometimes, the cause of wrist pain is not known. Often, the pain goes away when you follow instructions from your health care provider for relieving pain at home, such as resting or icing the wrist. If your wrist pain continues, it is important to tell your health care provider. Follow these instructions at home:  Rest the wrist area for at least 48 hours or as long as told by your health care provider.  If a splint or elastic bandage has been applied, use it as told by your health care provider. ? Remove the splint or bandage only as told by your health care provider. ? Loosen the splint or bandage if your fingers tingle, become numb, or turn cold or blue.  If directed, apply ice to the injured area. ? If you have a removable splint or elastic bandage, remove it as told by your health care provider. ? Put ice in a plastic bag. ? Place a towel between your skin and the bag or between your splint or bandage and the bag. ? Leave the ice on for 20 minutes, 2-3 times a day.   Keep your  arm raised (elevated) above the level of your heart while you are sitting or lying down.  Take over-the-counter and prescription medicines only as told by your health care provider.  Keep all follow-up visits as told by your health care provider. This is important. Contact a health care provider if:  You have a sudden sharp pain in the wrist, hand, or arm that is different or new.  The swelling or bruising on your wrist or hand gets worse.  Your skin becomes red, gets a rash, or has open sores.  Your pain does not get better or it gets worse. Get help right away if:  You lose feeling in your fingers or hand.  Your fingers turn white, very red, or cold and blue.  You cannot move your fingers.  You have a fever or chills. This information is not intended to replace advice given to you by your health care provider. Make sure you discuss any questions you have with your health care provider. Document Revised: 09/03/2017 Document Reviewed: 04/09/2016 Elsevier Patient Education  [place].  Preventing Carpal Tunnel Syndrome  Carpal tunnel syndrome is a condition that causes pain, numbness, and weakness in the wrist, hand, and fingers. The carpal tunnel is a narrow, hollow space in the wrist. Tendons and one of the main nerves in the hand (median nerve) pass through the carpal tunnel. The median nerve supplies feeling to the thumb and the first three fingers. It also supplies the muscles at the base of the thumb. Carpal tunnel syndrome happens when the median nerve gets squeezed in the area where it passes through the carpal tunnel. In some cases, it may not be possible to prevent carpal tunnel syndrome. However, you can take steps to relieve pressure on your wrist and reduce your risk of developing this condition. How can this condition affect me? Carpal tunnel syndrome can affect your ability to do jobs or activities that involve hand, wrist, and finger action. It can cause  symptoms such as:  Pain in the wrist, hand, and fingers.  Burning, tingling, or numbness in the affected area.  A weak feeling in your hands. You may have trouble grabbing and holding items. Symptoms may get worse over time. For some people, symptoms get worse at night. What can increase my risk? The following factors may make you more likely to develop this condition:  Having a job that requires you to repeatedly move your wrist or requires you to use tools that vibrate. This may include jobs that involve using computers, working on an [employer], or working with [place] such as Pension scheme manager.  Being a woman.  Having a family history of the condition.  Having certain conditions, such as: ? Diabetes. ? Pregnancy. ? Obesity. ? Thyroid disease. ? Rheumatoid arthritis. What actions can I take to help prevent this condition?      Avoid making repetitive hand and wrist motions that cause your wrist to get stiff or painful.  Take frequent breaks if you use your hands and wrists for many hours at a time.  Stretch your hands and fingers often to get blood flowing and relieve tension.  Keep your wrists in the natural position when using a computer keyboard or mouse. Do not bend your wrists downward or sideways.  If you use your hands and wrists for many hours at work, make changes to your work space to ease pressure on your wrists. You may want to use: ? A padded wrist rest for computer work. ? A slanted computer keyboard. ? Hand tools with padded handles to reduce vibrations.  Consider wearing a wrist brace. This will not prevent carpal tunnel syndrome but may keep it from getting worse. A wrist brace reduces bending and stress.  Closely manage any medical conditions you have that can put you at risk for carpal tunnel syndrome. Have your blood sugar checked to make sure you are not developing diabetes. If you have diabetes, work with your health care provider to keep  your blood sugar under control. Where to find more information  [employer] of Neurological Disorders and Stroke: [email or web address]  [place] [facility]: [email or web address] Contact a health care provider if:  You have numbness or tingling in your wrist, hand,  or fingers.  You have pain or a burning sensation in your wrist, hand, or fingers.  Pain, tingling, or burning wakes you up at night.  Your hand becomes weak and clumsy.  You frequently drop objects.  You are unable to use your wrists and hands without pain. Summary  Carpal tunnel syndrome is a condition that causes pain, numbness, and weakness in the wrist, hand, and fingers.  You can take steps to relieve pressure on your wrist and reduce your risk of developing this condition.  Avoid making repetitive hand and wrist motions that cause your wrist to get stiff or painful.  If you use your hands and wrists for many hours at work, you may want to make changes to your work space to ease pressure on your wrists.  Take frequent breaks to stretch your hands and fingers. This information is not intended to replace advice given to you by your health care provider. Make sure you discuss any questions you have with your health care provider. Document Revised: 02/03/2018 Document Reviewed: 02/03/2018 Elsevier Patient Education  [place].

## 2020-07-05 LAB — CERVICOVAGINAL ANCILLARY ONLY
Bacterial Vaginitis (gardnerella): POSITIVE — AB
Candida Glabrata: NEGATIVE
Candida Vaginitis: POSITIVE — AB
Chlamydia: NEGATIVE
Comment: NEGATIVE
Comment: NEGATIVE
Comment: NEGATIVE
Comment: NEGATIVE
Comment: NEGATIVE
Comment: NORMAL
Neisseria Gonorrhea: NEGATIVE
Trichomonas: NEGATIVE

## 2020-07-06 ENCOUNTER — Other Ambulatory Visit: Payer: Self-pay | Admitting: Family Medicine

## 2020-07-06 DIAGNOSIS — B3731 Acute candidiasis of vulva and vagina: Secondary | ICD-10-CM

## 2020-07-06 DIAGNOSIS — B9689 Other specified bacterial agents as the cause of diseases classified elsewhere: Secondary | ICD-10-CM

## 2020-07-06 DIAGNOSIS — N760 Acute vaginitis: Secondary | ICD-10-CM

## 2020-07-06 MED ORDER — FLUCONAZOLE 150 MG PO TABS: ORAL_TABLET | ORAL | 0 refills | Status: DC

## 2020-07-06 MED ORDER — METRONIDAZOLE 500 MG PO TABS: 500.0000 mg | ORAL_TABLET | Freq: Two times a day (BID) | ORAL | 0 refills | Status: AC

## 2020-08-02 DIAGNOSIS — F329 Major depressive disorder, single episode, unspecified: Secondary | ICD-10-CM | POA: Diagnosis not present

## 2020-08-07 DIAGNOSIS — D259 Leiomyoma of uterus, unspecified: Secondary | ICD-10-CM | POA: Diagnosis not present

## 2020-08-07 DIAGNOSIS — N946 Dysmenorrhea, unspecified: Secondary | ICD-10-CM | POA: Diagnosis not present

## 2020-08-12 DIAGNOSIS — F329 Major depressive disorder, single episode, unspecified: Secondary | ICD-10-CM | POA: Diagnosis not present

## 2020-08-16 ENCOUNTER — Other Ambulatory Visit: Payer: Self-pay

## 2020-08-16 ENCOUNTER — Ambulatory Visit: Payer: BC Managed Care – PPO | Admitting: Family Medicine

## 2020-08-16 ENCOUNTER — Encounter: Payer: Self-pay | Admitting: Family Medicine

## 2020-08-16 VITALS — BP 102/78 | HR 98 | Temp 97.5°F | Wt 328.0 lb

## 2020-08-16 DIAGNOSIS — R002 Palpitations: Secondary | ICD-10-CM | POA: Diagnosis not present

## 2020-08-16 DIAGNOSIS — J302 Other seasonal allergic rhinitis: Secondary | ICD-10-CM

## 2020-08-16 DIAGNOSIS — D500 Iron deficiency anemia secondary to blood loss (chronic): Secondary | ICD-10-CM | POA: Diagnosis not present

## 2020-08-16 DIAGNOSIS — R0602 Shortness of breath: Secondary | ICD-10-CM | POA: Diagnosis not present

## 2020-08-16 DIAGNOSIS — J4520 Mild intermittent asthma, uncomplicated: Secondary | ICD-10-CM | POA: Diagnosis not present

## 2020-08-16 DIAGNOSIS — D219 Benign neoplasm of connective and other soft tissue, unspecified: Secondary | ICD-10-CM

## 2020-08-16 DIAGNOSIS — E039 Hypothyroidism, unspecified: Secondary | ICD-10-CM

## 2020-08-16 NOTE — Patient Instructions (Signed)
Anemia  Anemia is a condition in which you do not have enough red blood cells or hemoglobin. Hemoglobin is a substance in red blood cells that carries oxygen. When you do not have enough red blood cells or hemoglobin (are anemic), your body cannot get enough oxygen and your organs may not work properly. As a result, you may feel very tired or have other problems. What are the causes? Common causes of anemia include:  Excessive bleeding. Anemia can be caused by excessive bleeding inside or outside the body, including bleeding from the intestine or from periods in women.  Poor nutrition.  Long-lasting (chronic) kidney, thyroid, and liver disease.  Bone marrow disorders.  Cancer and treatments for cancer.  HIV (human immunodeficiency virus) and AIDS (acquired immunodeficiency syndrome).  Treatments for HIV and AIDS.  Spleen problems.  Blood disorders.  Infections, medicines, and autoimmune disorders that destroy red blood cells. What are the signs or symptoms? Symptoms of this condition include:  Minor weakness.  Dizziness.  Headache.  Feeling heartbeats that are irregular or faster than normal (palpitations).  Shortness of breath, especially with exercise.  Paleness.  Cold sensitivity.  Indigestion.  Nausea.  Difficulty sleeping.  Difficulty concentrating. Symptoms may occur suddenly or develop slowly. If your anemia is mild, you may not have symptoms. How is this diagnosed? This condition is diagnosed based on:  Blood tests.  Your medical history.  A physical exam.  Bone marrow biopsy. Your health care provider may also check your stool (feces) for blood and may do additional testing to look for the cause of your bleeding. You may also have other tests, including:  Imaging tests, such as a CT scan or MRI.  Endoscopy.  Colonoscopy. How is this treated? Treatment for this condition depends on the cause. If you continue to lose a lot of blood, you may  need to be treated at a hospital. Treatment may include:  Taking supplements of iron, vitamin S31, or folic acid.  Taking a hormone medicine (erythropoietin) that can help to stimulate red blood cell growth.  Having a blood transfusion. This may be needed if you lose a lot of blood.  Making changes to your diet.  Having surgery to remove your spleen. Follow these instructions at home:  Take over-the-counter and prescription medicines only as told by your health care provider.  Take supplements only as told by your health care provider.  Follow any diet instructions that you were given.  Keep all follow-up visits as told by your health care provider. This is important. Contact a health care provider if:  You develop new bleeding anywhere in the body. Get help right away if:  You are very weak.  You are short of breath.  You have pain in your abdomen or chest.  You are dizzy or feel faint.  You have trouble concentrating.  You have bloody or black, tarry stools.  You vomit repeatedly or you vomit up blood. Summary  Anemia is a condition in which you do not have enough red blood cells or enough of a substance in your red blood cells that carries oxygen (hemoglobin).  Symptoms may occur suddenly or develop slowly.  If your anemia is mild, you may not have symptoms.  This condition is diagnosed with blood tests as well as a medical history and physical exam. Other tests may be needed.  Treatment for this condition depends on the cause of the anemia. This information is not intended to replace advice given to you by  your health care provider. Make sure you discuss any questions you have with your health care provider. Document Revised: 09/03/2017 Document Reviewed: 10/23/2016 Elsevier Patient Education  [place].  Iron Deficiency Anemia, Adult Iron-deficiency anemia is when you have a low amount of red blood cells or hemoglobin. This happens because you have  too little iron in your body. Hemoglobin carries oxygen to parts of the body. Anemia can cause your body to not get enough oxygen. It may or may not cause symptoms. Follow these instructions at home: Medicines  Take over-the-counter and prescription medicines only as told by your doctor. This includes iron pills (supplements) and vitamins.  If you cannot handle taking iron pills by mouth, ask your doctor about getting iron through: ? A vein (intravenously). ? A shot (injection) into a muscle.  Take iron pills when your stomach is empty. If you cannot handle this, take them with food.  Do not drink milk or take antacids at the same time as your iron pills.  To prevent trouble pooping (constipation), eat fiber or take medicine (stool softener) as told by your doctor. Eating and drinking   Talk with your doctor before changing the foods you eat. He or she may tell you to eat foods that have a lot of iron, such as: ? Liver. ? Lowfat (lean) beef. ? Breads and cereals that have iron added to them (fortified breads and cereals). ? Eggs. ? Dried fruit. ? Dark green, leafy vegetables.  Drink enough fluid to keep your pee (urine) clear or pale yellow.  Eat fresh fruits and vegetables that are high in vitamin C. They help your body to use iron. Foods with a lot of vitamin C include: ? Oranges. ? Peppers. ? Tomatoes. ? Mangoes. General instructions  Return to your normal activities as told by your doctor. Ask your doctor what activities are safe for you.  Keep yourself clean, and keep things clean around you (your surroundings). Anemia can make you get sick more easily.  Keep all follow-up visits as told by your doctor. This is important. Contact a doctor if:  You feel sick to your stomach (nauseous).  You throw up (vomit).  You feel weak.  You are sweating for no clear reason.  You have trouble pooping, such as: ? Pooping (having a bowel movement) less than 3 times a  week. ? Straining to poop. ? Having poop that is hard, dry, or larger than normal. ? Feeling full or bloated. ? Pain in the lower belly. ? Not feeling better after pooping. Get help right away if:  You pass out (faint). If this happens, do not drive yourself to the hospital. Call your local emergency services (911 in the [place]  You have chest pain.  You have shortness of breath that: ? Is very bad. ? Gets worse with physical activity.  You have a fast heartbeat.  You get light-headed when getting up from sitting or lying down. This information is not intended to replace advice given to you by your health care provider. Make sure you discuss any questions you have with your health care provider. Document Revised: 09/03/2017 Document Reviewed: 06/10/2016 Elsevier Patient Education  [place].  Palpitations Palpitations are feelings that your heartbeat is irregular or is faster than normal. It may feel like your heart is fluttering or skipping a beat. Palpitations are usually not a serious problem. They may be caused by many things, including smoking, caffeine, alcohol, stress, and certain medicines or drugs.  Most causes of palpitations are not serious. However, some palpitations can be a sign of a serious problem. You may need further tests to rule out serious medical problems. Follow these instructions at home:     Pay attention to any changes in your condition. Take these actions to help manage your symptoms: Eating and drinking  Avoid foods and drinks that may cause palpitations. These may include: ? Caffeinated coffee, tea, soft drinks, diet pills, and energy drinks. ? Chocolate. ? Alcohol. Lifestyle  Take steps to reduce your stress and anxiety. Things that can help you relax include: ? Yoga. ? Mind-body activities, such as deep breathing, meditation, or using words and images to create positive thoughts (guided imagery). ? Physical activity, such as swimming,  jogging, or walking. Tell your health care provider if your palpitations increase with activity. If you have chest pain or shortness of breath with activity, do not continue the activity until you are seen by your health care provider. ? Biofeedback. This is a method that helps you learn to use your mind to control things in your body, such as your heartbeat.  Do not use drugs, including cocaine or ecstasy. Do not use marijuana.  Get plenty of rest and sleep. Keep a regular bed time. General instructions  Take over-the-counter and prescription medicines only as told by your health care provider.  Do not use any products that contain nicotine or tobacco, such as cigarettes and e-cigarettes. If you need help quitting, ask your health care provider.  Keep all follow-up visits as told by your health care provider. This is important. These may include visits for further testing if palpitations do not go away or get worse. Contact a health care provider if you:  Continue to have a fast or irregular heartbeat after 24 hours.  Notice that your palpitations occur more often. Get help right away if you:  Have chest pain or shortness of breath.  Have a severe headache.  Feel dizzy or you faint. Summary  Palpitations are feelings that your heartbeat is irregular or is faster than normal. It may feel like your heart is fluttering or skipping a beat.  Palpitations may be caused by many things, including smoking, caffeine, alcohol, stress, certain medicines, and drugs.  Although most causes of palpitations are not serious, some causes can be a sign of a serious medical problem.  Get help right away if you faint or have chest pain, shortness of breath, a severe headache, or dizziness. This information is not intended to replace advice given to you by your health care provider. Make sure you discuss any questions you have with your health care provider. Document Revised: 11/03/2017 Document  Reviewed: 11/03/2017 Elsevier Patient Education  2020 [employer].  Shortness of Breath, Adult Shortness of breath is when a person has trouble breathing enough air or when a person feels like she or he is having trouble breathing in enough air. Shortness of breath could be a sign of a medical problem. Follow these instructions at home:   Pay attention to any changes in your symptoms.  Do not use any products that contain nicotine or tobacco, such as cigarettes, e-cigarettes, and chewing tobacco.  Do not smoke. Smoking is a common cause of shortness of breath. If you need help quitting, ask your health care provider.  Avoid things that can irritate your airways, such as: ? Mold. ? Dust. ? Air pollution. ? Chemical fumes. ? Things that can cause allergy symptoms (allergens), if you have  allergies.  Keep your living space clean and free of mold and dust.  Rest as needed. Slowly return to your usual activities.  Take over-the-counter and prescription medicines only as told by your health care provider. This includes oxygen therapy and inhaled medicines.  Keep all follow-up visits as told by your health care provider. This is important. Contact a health care provider if:  Your condition does not improve as soon as expected.  You have a hard time doing your normal activities, even after you rest.  You have new symptoms. Get help right away if:  Your shortness of breath gets worse.  You have shortness of breath when you are resting.  You feel light-headed or you faint.  You have a cough that is not controlled with medicines.  You cough up blood.  You have pain with breathing.  You have pain in your chest, arms, shoulders, or abdomen.  You have a fever.  You cannot walk up stairs or exercise the way that you normally do. These symptoms may represent a serious problem that is an emergency. Do not wait to see if the symptoms will go away. Get medical help right away.  Call your local emergency services (911 in the [place] Do not drive yourself to the hospital. Summary  Shortness of breath is when a person has trouble breathing enough air. It can be a sign of a medical problem.  Avoid things that irritate your lungs, such as smoking, pollution, mold, and dust.  Pay attention to changes in your symptoms and contact your health care provider if you have a hard time completing daily activities because of shortness of breath. This information is not intended to replace advice given to you by your health care provider. Make sure you discuss any questions you have with your health care provider. Document Revised: 02/21/2018 Document Reviewed: 02/21/2018 Elsevier Patient Education  [place].

## 2020-08-16 NOTE — Progress Notes (Signed)
Subjective:    Patient ID: Christine Nelson, female    DOB: [birth year], 29 y.o.   MRN: [id number]  No chief complaint on file.   HPI Patient was seen today for acute concern.  Pt notes soreness in lungs with deep inhalation and SOB. Sensation resolved after using albuterol inhaler.  Pt also notes palpitations, occurred while sitting.  HR up to 140 bpm per smart watch.  Lasted a minute or so. Decreased with deep breathing.  Patient denies caffeine intake. Pt has a history of seasonal allergies. Taking Xyzal.  Patient concerned about large fibroid. States uterus is the size of someone 3 months pregnant. Advised to start Lupron, but hesitant.  Has appointment with reproductive endocrinologist in March.  Pt taking iron supplements intermittently.  Past Medical History:  Diagnosis Date  . Diabetes mellitus, type II ([place])    Hgb A1c in 03/2019 was 6.8%  . Fibroids   . History of prediabetes   . Hypothyroid   . Hypothyroidism   . Obesity   . PCOS (polycystic ovarian syndrome)   . Uterine leiomyoma   . Vitamin D deficiency     No Known Allergies  ROS General: Denies fever, chills, night sweats, changes in weight, changes in appetite HEENT: Denies headaches, ear pain, changes in vision, rhinorrhea, sore throat CV: Denies CP, orthopnea  + SOB, palpitations, intermittent tachycardia Pulm: Denies SOB, cough, wheezing  +SOB, soreness with inhalation GI: Denies abdominal pain, nausea, vomiting, diarrhea, constipation GU: Denies dysuria, hematuria, frequency, vaginal discharge  +fibroid, h/o anemia Msk: Denies muscle cramps, joint pains Neuro: Denies weakness, numbness, tingling Skin: Denies rashes, bruising Psych: Denies depression, anxiety, hallucinations      Objective:    Blood pressure 102/78, pulse 98, temperature (!) 97.5 F (36.4 C), temperature source Oral, weight (!) 328 lb (148.8 kg), SpO2 97 %.   Gen. Pleasant, well-nourished, in no distress, normal affect   HEENT:  [place]/AT, face symmetric, conjunctiva clear, no scleral icterus, PERRLA, EOMI, nares patent without drainage, pharynx without erythema or exudate. Neck: No JVD, no thyromegaly, no carotid bruits Lungs: no accessory muscle use, CTAB, no wheezes or rales Cardiovascular: RRR, no m/r/g, no peripheral edema Abdomen: BS present, soft, NT/ND. Musculoskeletal: No deformities, no cyanosis or clubbing, normal tone Neuro:  A&Ox3, CN II-XII intact, normal gait Skin:  Warm, no lesions/ rash   Wt Readings from Last 3 Encounters:  08/16/20 (!) 328 lb (148.8 kg)  07/04/20 (!) 323 lb 9.6 oz (146.8 kg)  05/30/20 (!) 322 lb (146.1 kg)    Lab Results  Component Value Date   WBC 9.5 04/30/2020   HGB 10.6 (L) 04/30/2020   HCT 35.8 04/30/2020   PLT 418 04/30/2020   GLUCOSE 98 04/30/2020   CHOL 123 04/30/2020   TRIG 50 04/30/2020   HDL 43 04/30/2020   LDLCALC 68 04/30/2020   ALT 16 04/30/2020   AST 16 04/30/2020   NA 141 04/30/2020   K 4.7 04/30/2020   CL 101 04/30/2020   CREATININE 0.75 04/30/2020   BUN 7 04/30/2020   CO2 22 04/30/2020   TSH 3.03 04/11/2020   HGBA1C 6.5 04/11/2020    Assessment/Plan:  Mild intermittent reactive airway disease without complication -Likely exacerbated by seasonal allergies -Continue albuterol inhaler as needed -Discussed obtaining CXR for continued or worsening symptoms  Seasonal allergies -Continue Xyzal 5 mg daily  SOB (shortness of breath) -Discussed possible causes including anemia, reactive airway disease/seasonal allergies, deconditioning, thyroid dysfunction, cardiac etiology -We will obtain labs -Consider  CXR and Holter monitor for continued symptoms -Continue current medications including Xyzal, iron 325 mg, Synthroid 125 mcg  Palpitations -Discussed possible causes including anemia, anxiety, reactive airway disease, thyroid dysfunction, cardiac etiology -Currently RRR -Pt wishes to wait on EKG at this time. -Consider Holter monitor for  continued sxs - Plan: CBC with Differential/Platelet, TSH, T4, free  Iron deficiency anemia due to chronic blood loss  -Patient encouraged to restart iron supplement daily -Will obtain labs -Encouraged to follow-up with OB/GYN for issues with fibroid - Plan: CBC with Differential/Platelet, Iron, TIBC and Ferritin Panel  Acquired hypothyroidism -Continue Synthroid 125 mcg daily -We will obtain TSH -Plan: TSH, T4, free  Fibroid -MRI pelvis with and without contrast 05/13/2020 with 11 cm submucosal fibroid in central uterine corpus and fundus with mild increase in size since previous study [on 08/19/2019]. -Patient encouraged to consider Lupron to see if fibroid will decrease in size -Patient encouraged to consider fertility options such as freezing eggs -Given handout -Continue follow-up with OB/GYN -Patient encouraged to keep follow-up with fertility endocrinologist in 2022  F/u in 1 month, sooner if needed  Grier Mitts, MD

## 2020-08-17 LAB — CBC WITH DIFFERENTIAL/PLATELET
Absolute Monocytes: 375 cells/uL (ref 200–950)
Basophils Absolute: 21 cells/uL (ref 0–200)
Basophils Relative: 0.2 %
Eosinophils Absolute: 107 cells/uL (ref 15–500)
Eosinophils Relative: 1 %
HCT: 34.7 % — ABNORMAL LOW (ref 35.0–45.0)
Hemoglobin: 11 g/dL — ABNORMAL LOW (ref 11.7–15.5)
Lymphs Abs: 2921 cells/uL (ref 850–3900)
MCH: 24.8 pg — ABNORMAL LOW (ref 27.0–33.0)
MCHC: 31.7 g/dL — ABNORMAL LOW (ref 32.0–36.0)
MCV: 78.2 fL — ABNORMAL LOW (ref 80.0–100.0)
MPV: 10.2 fL (ref 7.5–12.5)
Monocytes Relative: 3.5 %
Neutro Abs: 7276 cells/uL (ref 1500–7800)
Neutrophils Relative %: 68 %
Platelets: 416 10*3/uL — ABNORMAL HIGH (ref 140–400)
RBC: 4.44 10*6/uL (ref 3.80–5.10)
RDW: 16.8 % — ABNORMAL HIGH (ref 11.0–15.0)
Total Lymphocyte: 27.3 %
WBC: 10.7 10*3/uL (ref 3.8–10.8)

## 2020-08-17 LAB — TSH: TSH: 5.46 mIU/L — ABNORMAL HIGH

## 2020-08-17 LAB — T4, FREE: Free T4: 1.2 ng/dL (ref 0.8–1.8)

## 2020-08-17 LAB — IRON,TIBC AND FERRITIN PANEL
%SAT: 8 % (calc) — ABNORMAL LOW (ref 16–45)
Ferritin: 19 ng/mL (ref 16–154)
Iron: 40 ug/dL (ref 40–190)
TIBC: 483 mcg/dL (calc) — ABNORMAL HIGH (ref 250–450)

## 2020-08-22 DIAGNOSIS — F329 Major depressive disorder, single episode, unspecified: Secondary | ICD-10-CM | POA: Diagnosis not present

## 2020-08-24 DIAGNOSIS — J302 Other seasonal allergic rhinitis: Secondary | ICD-10-CM | POA: Insufficient documentation

## 2020-08-24 DIAGNOSIS — D219 Benign neoplasm of connective and other soft tissue, unspecified: Secondary | ICD-10-CM | POA: Insufficient documentation

## 2020-08-24 DIAGNOSIS — D500 Iron deficiency anemia secondary to blood loss (chronic): Secondary | ICD-10-CM | POA: Insufficient documentation

## 2020-09-04 DIAGNOSIS — F329 Major depressive disorder, single episode, unspecified: Secondary | ICD-10-CM | POA: Diagnosis not present

## 2020-09-05 DIAGNOSIS — F329 Major depressive disorder, single episode, unspecified: Secondary | ICD-10-CM | POA: Diagnosis not present

## 2020-09-13 ENCOUNTER — Encounter: Payer: Self-pay | Admitting: Family Medicine

## 2020-09-18 DIAGNOSIS — N920 Excessive and frequent menstruation with regular cycle: Secondary | ICD-10-CM | POA: Diagnosis not present

## 2020-10-03 ENCOUNTER — Telehealth: Payer: Self-pay

## 2020-10-03 NOTE — Telephone Encounter (Signed)
The office has received a Bariatric Surgery Form for [facility]. Form has been placed on Dr Salomon Fick folder for completing. Pt will be notified when the form is ready for pick up

## 2020-10-16 DIAGNOSIS — N920 Excessive and frequent menstruation with regular cycle: Secondary | ICD-10-CM | POA: Diagnosis not present

## 2020-10-16 DIAGNOSIS — D259 Leiomyoma of uterus, unspecified: Secondary | ICD-10-CM | POA: Diagnosis not present

## 2020-10-17 ENCOUNTER — Ambulatory Visit: Payer: BC Managed Care – PPO | Admitting: Internal Medicine

## 2020-10-31 ENCOUNTER — Other Ambulatory Visit: Payer: Self-pay | Admitting: Surgery

## 2020-10-31 ENCOUNTER — Other Ambulatory Visit (HOSPITAL_COMMUNITY): Payer: Self-pay | Admitting: Surgery

## 2020-10-31 DIAGNOSIS — Z6841 Body Mass Index (BMI) 40.0 and over, adult: Secondary | ICD-10-CM | POA: Diagnosis not present

## 2020-10-31 DIAGNOSIS — E119 Type 2 diabetes mellitus without complications: Secondary | ICD-10-CM | POA: Diagnosis not present

## 2020-11-05 ENCOUNTER — Other Ambulatory Visit: Payer: BC Managed Care – PPO

## 2020-11-06 DIAGNOSIS — F509 Eating disorder, unspecified: Secondary | ICD-10-CM | POA: Diagnosis not present

## 2020-11-11 DIAGNOSIS — D259 Leiomyoma of uterus, unspecified: Secondary | ICD-10-CM | POA: Diagnosis not present

## 2020-11-15 ENCOUNTER — Ambulatory Visit: Payer: BC Managed Care – PPO | Admitting: Internal Medicine

## 2020-11-15 NOTE — Progress Notes (Deleted)
Patient ID: Christine Nelson, female   DOB: [birth year], 30 y.o.   MRN: [id number]   This visit occurred during the SARS-CoV-2 public health emergency.  Safety protocols were in place, including screening questions prior to the visit, additional usage of staff PPE, and extensive cleaning of exam room while observing appropriate contact time as indicated for disinfecting solutions.   HPI  Christine Nelson is a 29 y.o. female, initially referred by her ObGyn , Dr. Anderson Malta A. Nelson, returning for follow-up for hypothyroidism and type 2 diabetes, controlled, non-insulin-dependent, without long-term complications.  At last visit, she returned after 2 years and 2 months, but now returns after 7 months.  Hypothyroidism  - dx'ed in 2013 -She was on levothyroxine 150 mcg in the past but stopped in 2018 >> restarted in 2020.  Pt is on levothyroxine 125 mcg daily, taken: -At last visit, she was missing 1-2 doses a week-discussed about taking it every day - in am - fasting - at least 30 min from b'fast - no calcium - no iron - + multivitamins -moved more than 4 hours later at last visit - no PPIs - not on Biotin  Reviewed her TFTs: Lab Results  Component Value Date   TSH 5.46 (H) 08/16/2020   TSH 3.03 04/11/2020   TSH 13.68 (H) 03/15/2019   TSH 6.960 (H) 02/04/2018   Lab Results  Component Value Date   FREET4 1.2 08/16/2020   FREET4 1.15 04/11/2020   FREET4 0.69 03/15/2019   FREET4 1.09 02/04/2018   Lab Results  Component Value Date   T3FREE 2.7 02/04/2018   05/03/2015: TSH 0.743, free T4 1.68  She continues to describe fatigue and occasional cold intolerance, along with difficulty losing weight.  These are not new.  Pt denies: - feeling nodules in neck - hoarseness - dysphagia - choking - SOB with lying down  She has + FH of thyroid disorders in: Father. No FH of thyroid cancer. No h/o radiation tx to head or neck.  No herbal supplements. No Biotin use. No recent  steroids use.   Type 2 diabetes: -Previously on Metformin -trying to exercise 5 out of 7 days  Reviewed HbA1c levels -new diagnosis of type 2 diabetes since 04/2020, after a second HbA1c returned >6.4%: Lab Results  Component Value Date   HGBA1C 6.5 04/11/2020   HGBA1C 6.8 (H) 03/15/2019   HGBA1C 5.5 02/04/2018   She is on iron.  She sees OB/GYN for PCOS, uterine fibroid.  On OCPs.  Sees Dr Christine Nelson.  She would like to come off OCPs as she feels that they are hindering her weight loss.  ROS: Constitutional: no weight gain/no weight loss, no fatigue, no subjective hyperthermia, no subjective hypothermia Eyes: no blurry vision, no xerophthalmia ENT: no sore throat, + see HPI Cardiovascular: no CP/no SOB/no palpitations/no leg swelling Respiratory: no cough/no SOB/no wheezing Gastrointestinal: no N/no V/no D/no C/no acid reflux Musculoskeletal: no muscle aches/no joint aches Skin: no rashes, no hair loss Neurological: no tremors/no numbness/no tingling/no dizziness  I reviewed pt's medications, allergies, PMH, social hx, family hx, and changes were documented in the history of present illness. Otherwise, unchanged from my initial visit note.  Past Medical History:  Diagnosis Date  . Diabetes mellitus, type II ([place])    Hgb A1c in 03/2019 was 6.8%  . Fibroids   . History of prediabetes   . Hypothyroid   . Hypothyroidism   . Obesity   . PCOS (polycystic ovarian syndrome)   .  Uterine leiomyoma   . Vitamin D deficiency    No past surgical history on file. Social History   Socioeconomic History  . Marital status: Married    Spouse name: Manufacturing engineer  . Number of children: Not on file  . Years of education: Not on file  . Highest education level: Not on file  Occupational History  . Occupation: Ship broker  . Occupation: Administrative Asst for [employer]  Tobacco Use  . Smoking status: Never Smoker  . Smokeless tobacco: Never Used  Vaping Use  . Vaping Use: Never used   Substance and Sexual Activity  . Alcohol use: Never  . Drug use: Never  . Sexual activity: Yes  Other Topics Concern  . Not on file  Social History Narrative   Pt is single.  She has a boyfriend.  Pt graduated college with a degree in Biology.  Pt has always wanted to become a physician.  Pt was teaching Biology.  She is now working part-time at [employer].        Social Determinants of Health   Financial Resource Strain: Not on file  Food Insecurity: Not on file  Transportation Needs: Not on file  Physical Activity: Not on file  Stress: Not on file  Social Connections: Not on file  Intimate Partner Violence: Not on file   Current Outpatient Medications on File Prior to Visit  Medication Sig Dispense Refill  . ELDERBERRY PO Take 1 tablet by mouth daily.    . ferrous sulfate 325 (65 FE) MG EC tablet Take 1 tablet (325 mg total) by mouth in the morning and at bedtime. 90 tablet 0  . fluconazole (DIFLUCAN) 150 MG tablet Take 1 tab now.  Repeat dose in 3 days. 2 tablet 0  . levocetirizine (XYZAL) 5 MG tablet TAKE 1 TABLET BY MOUTH EVERY DAY IN THE EVENING 90 tablet 0  . levothyroxine (SYNTHROID) 125 MCG tablet Take 125 mcg by mouth daily.    . Multiple Vitamin (MULTIVITAMIN) tablet Take 1 tablet by mouth daily.    . SPRINTEC 28 0.25-35 MG-MCG tablet Take 1 tablet by mouth daily.    . vitamin C (ASCORBIC ACID) 500 MG tablet Take 500 mg by mouth daily.    . Vitamin D, Ergocalciferol, (DRISDOL) 1.25 MG (50000 UNIT) CAPS capsule Take 1 capsule (50,000 Units total) by mouth every 7 (seven) days. 4 capsule 0   No current facility-administered medications on file prior to visit.   No Known Allergies Family History  Problem Relation Age of Onset  . Hypertension Mother   . Thyroid disease Father   . Hypertension Maternal Grandfather   . Stroke Maternal Grandfather   Pertinent family history: See HPI  PE: There were no vitals taken for this visit. Wt Readings from Last 3  Encounters:  08/16/20 (!) 328 lb (148.8 kg)  07/04/20 (!) 323 lb 9.6 oz (146.8 kg)  05/30/20 (!) 322 lb (146.1 kg)   Constitutional: overweight, in NAD Eyes: PERRLA, EOMI, no exophthalmos ENT: moist mucous membranes, no thyromegaly, no cervical lymphadenopathy Cardiovascular: RRR, No MRG Respiratory: CTA B Gastrointestinal: abdomen soft, NT, ND, BS+ Musculoskeletal: no deformities, strength intact in all 4 Skin: moist, warm, no rashes Neurological: no tremor with outstretched hands, DTR normal in all 4  ASSESSMENT: 1. Hypothyroidism  2. Prediabetes  PLAN:  1. Patient with longstanding, mild hypothyroidism in the past, previously on levothyroxine, which she was able to come off in 2018, however we had to restart levothyroxine afterwards.  At last visit, she was missing doses of levothyroxine and TSH was 13.  We discussed about taking it correctly. -Her TPO and ATA antibodies were not elevated - latest thyroid labs reviewed with pt >> still slightly elevated: Lab Results  Component Value Date   TSH 5.46 (H) 08/16/2020   - she continues on LT4 125 mcg daily - pt feels good on this dose. - we discussed about taking the thyroid hormone every day, with water, >30 minutes before breakfast, separated by >4 hours from acid reflux medications, calcium, iron, multivitamins. Pt. is taking it correctly now.  She was taking multivitamins too close to levothyroxine and we moved on at least 4 hours apart - will check thyroid tests today: TSH and fT4 - If labs are abnormal, she will need to return for repeat TFTs in 1.5 months -Otherwise, I will see her back in 6 months  2.  Type 2 diabetes -Patient had 2 HbA1c levels in the diabetic range, in 03/2019: 6.8%, and at last visit, 6.5%. -She is not on occasion for her diabetes - due to her improvement in control, we did not start Metformin at last visit -At last visit and again today we discussed about the importance of a healthy diet and also weight  loss to improve her glycemic control and reverse her prediabetes/mild diabetes.  She is currently seeing the weight management clinic. -We rechecked her HbA1c today: *** - advised to check sugars at different times of the day - 1x a day, rotating check times - advised for yearly eye exams >> she is UTD - return to clinic in 6 months    Philemon Kingdom, MD PhD [facility] Endocrinology

## 2020-11-21 ENCOUNTER — Ambulatory Visit: Payer: BC Managed Care – PPO | Admitting: Internal Medicine

## 2020-11-21 ENCOUNTER — Encounter: Payer: Self-pay | Admitting: Internal Medicine

## 2020-11-21 ENCOUNTER — Other Ambulatory Visit: Payer: Self-pay

## 2020-11-21 VITALS — BP 130/82 | HR 97 | Ht 63.0 in | Wt 334.4 lb

## 2020-11-21 DIAGNOSIS — E039 Hypothyroidism, unspecified: Secondary | ICD-10-CM

## 2020-11-21 DIAGNOSIS — E1165 Type 2 diabetes mellitus with hyperglycemia: Secondary | ICD-10-CM

## 2020-11-21 LAB — POCT GLYCOSYLATED HEMOGLOBIN (HGB A1C): Hemoglobin A1C: 6.2 % — AB (ref 4.0–5.6)

## 2020-11-21 NOTE — Patient Instructions (Addendum)
Please come back for labs in 5 weeks.  Please continue Levothyroxine 125 mcg daily.  Take the thyroid hormone EVERY DAY, with water, at least 30 minutes before breakfast, separated by at least 4 hours from: - acid reflux medications - calcium - iron - multivitamins  Please come back for a follow-up appointment in 6 months.

## 2020-11-21 NOTE — Progress Notes (Signed)
Patient ID: Christine Nelson, female   DOB: Jun 18, 1991, 30 y.o.   MRN: [id number]   This visit occurred during the SARS-CoV-2 public health emergency.  Safety protocols were in place, including screening questions prior to the visit, additional usage of staff PPE, and extensive cleaning of exam room while observing appropriate contact time as indicated for disinfecting solutions.   HPI  Christine Nelson is a 30 y.o. female, initially referred by her ObGyn , Dr. Anderson Malta A. Nelson, returning for follow-up for hypothyroidism and type 2 diabetes, controlled, non-insulin-dependent, without long-term complications.  At last visit, she returns after 2 years and 2 months, now returns after 7 months.  She missed another appointment earlier this month.  She [facility] the [facility] >> now seeing Bariatrics and plans to have gastric sleeve surgery within the next 3 to 4 months.  Hypothyroidism  - dx'ed in 2013 -She was on levothyroxine 150 mcg in the past but stopped in 2018 >> restarted in 2020  She is on levothyroxine 125 mcg daily: -At last visit, she was missing 1-2 doses a week and we discussed about taking it every day - in am - fasting - at least 30 min from b'fast - no calcium - no iron -  multivitamins - no PPIs - not on Biotin  Reviewed her TFTs: Lab Results  Component Value Date   TSH 5.46 (H) 08/16/2020   TSH 3.03 04/11/2020   TSH 13.68 (H) 03/15/2019   TSH 6.960 (H) 02/04/2018   Lab Results  Component Value Date   FREET4 1.2 08/16/2020   FREET4 1.15 04/11/2020   FREET4 0.69 03/15/2019   FREET4 1.09 02/04/2018   Lab Results  Component Value Date   T3FREE 2.7 02/04/2018   05/03/2015: TSH 0.743, free T4 1.68  She continues to describe fatigue and occasional cold intolerance, along with difficulty losing weight.  These are not new.  Pt denies: - feeling nodules in neck - hoarseness - dysphagia - choking - SOB with lying down  She has + FH of thyroid  disorders in: Father. No FH of thyroid cancer. No h/o radiation tx to head or neck.  No seaweed or kelp. No recent contrast studies. No herbal supplements. No Biotin use. No recent steroids use.   Type 2 diabetes: -Previously on Metformin  Reviewed HbA1c levels-new diagnosis of type 2 diabetes since 04/2020, after second HbA1c returned higher than 6.4%: Lab Results  Component Value Date   HGBA1C 6.5 04/11/2020   HGBA1C 6.8 (H) 03/15/2019   HGBA1C 5.5 02/04/2018   No CKD: Lab Results  Component Value Date   BUN 7 04/30/2020   BUN 9 03/15/2019   Lab Results  Component Value Date   CREATININE 0.75 04/30/2020   CREATININE 0.86 03/15/2019   No HL: Lab Results  Component Value Date   CHOL 123 04/30/2020   HDL 43 04/30/2020   LDLCALC 68 04/30/2020   TRIG 50 04/30/2020   CHOLHDL 2.9 04/30/2020   She sees OB/GYN for PCOS, uterine fibroid (now on Lupron).  Previously on OCPs but wanted to come off OCPs as she feels that they are hindering her weight loss. Sees Dr Mearl Latin.    ROS: Constitutional: + weight gain/no weight loss, no fatigue, no subjective hyperthermia, no subjective hypothermia Eyes: no blurry vision, no xerophthalmia ENT: no sore throat, + see HPI Cardiovascular: no CP/no SOB/no palpitations/no leg swelling Respiratory: no cough/no SOB/no wheezing Gastrointestinal: no N/no V/no D/no C/no acid reflux Musculoskeletal: no muscle aches/no  joint aches Skin: no rashes, no hair loss Neurological: no tremors/no numbness/no tingling/no dizziness  I reviewed pt's medications, allergies, PMH, social hx, family hx, and changes were documented in the history of present illness. Otherwise, unchanged from my initial visit note.  Past Medical History:  Diagnosis Date  . Diabetes mellitus, type II ([place])    Hgb A1c in 03/2019 was 6.8%  . Fibroids   . History of prediabetes   . Hypothyroid   . Hypothyroidism   . Obesity   . PCOS (polycystic ovarian syndrome)   . Uterine  leiomyoma   . Vitamin D deficiency    No past surgical history on file. Social History   Socioeconomic History  . Marital status: Married    Spouse name: Manufacturing engineer  . Number of children: Not on file  . Years of education: Not on file  . Highest education level: Not on file  Occupational History  . Occupation: Ship broker  . Occupation: Administrative Asst for [employer]  Tobacco Use  . Smoking status: Never Smoker  . Smokeless tobacco: Never Used  Vaping Use  . Vaping Use: Never used  Substance and Sexual Activity  . Alcohol use: Never  . Drug use: Never  . Sexual activity: Yes  Other Topics Concern  . Not on file  Social History Narrative   Pt is single.  She has a boyfriend.  Pt graduated college with a degree in Biology.  Pt has always wanted to become a physician.  Pt was teaching Biology.  She is now working part-time at [employer].        Social Determinants of Health   Financial Resource Strain: Not on file  Food Insecurity: Not on file  Transportation Needs: Not on file  Physical Activity: Not on file  Stress: Not on file  Social Connections: Not on file  Intimate Partner Violence: Not on file   Current Outpatient Medications on File Prior to Visit  Medication Sig Dispense Refill  . ELDERBERRY PO Take 1 tablet by mouth daily.    . ferrous sulfate 325 (65 FE) MG EC tablet Take 1 tablet (325 mg total) by mouth in the morning and at bedtime. 90 tablet 0  . fluconazole (DIFLUCAN) 150 MG tablet Take 1 tab now.  Repeat dose in 3 days. 2 tablet 0  . levocetirizine (XYZAL) 5 MG tablet TAKE 1 TABLET BY MOUTH EVERY DAY IN THE EVENING 90 tablet 0  . levothyroxine (SYNTHROID) 125 MCG tablet Take 125 mcg by mouth daily.    . Multiple Vitamin (MULTIVITAMIN) tablet Take 1 tablet by mouth daily.    . SPRINTEC 28 0.25-35 MG-MCG tablet Take 1 tablet by mouth daily.    . vitamin C (ASCORBIC ACID) 500 MG tablet Take 500 mg by mouth daily.    . Vitamin D, Ergocalciferol,  (DRISDOL) 1.25 MG (50000 UNIT) CAPS capsule Take 1 capsule (50,000 Units total) by mouth every 7 (seven) days. 4 capsule 0   No current facility-administered medications on file prior to visit.   No Known Allergies Family History  Problem Relation Age of Onset  . Hypertension Mother   . Thyroid disease Father   . Hypertension Maternal Grandfather   . Stroke Maternal Grandfather   Pertinent family history: See HPI  PE: BP 130/82   Pulse 97   Ht 5' 3"  (1.6 m)   Wt (!) 334 lb 6.4 oz (151.7 kg)   SpO2 99%   BMI 59.24 kg/m  Wt Readings from Last  3 Encounters:  11/21/20 (!) 334 lb 6.4 oz (151.7 kg)  08/16/20 (!) 328 lb (148.8 kg)  07/04/20 (!) 323 lb 9.6 oz (146.8 kg)   Constitutional: overweight, in NAD Eyes: PERRLA, EOMI, no exophthalmos ENT: moist mucous membranes, no thyromegaly, no cervical lymphadenopathy Cardiovascular: Tachycardia, RR, No MRG Respiratory: CTA B Gastrointestinal: abdomen soft, NT, ND, BS+ Musculoskeletal: no deformities, strength intact in all 4 Skin: moist, warm, no rashes Neurological: no tremor with outstretched hands, DTR normal in all 4  ASSESSMENT: 1. Hypothyroidism  2. Prediabetes  PLAN:  1. Patient with longstanding, mild hypothyroidism in the past, previously on levothyroxine, which she was able to come off in 2018, however we had to restart levothyroxine afterwards.  At last visit, she was missing doses of levothyroxine and TSH was 13.  We discussed about taking it correctly. -Her TPO and ATA antibodies were not elevated -Latest TSH was slightly elevated: Lab Results  Component Value Date   TSH 5.46 (H) 08/16/2020   -She continues on levothyroxine 125 mcg daily - She feels good on this dose. - we discussed about taking the thyroid hormone every day, with water, >30 minutes before breakfast, separated by >4 hours from acid reflux medications, calcium, iron, multivitamins. Pt. was previously taking multivitamins too close to levothyroxine,  but now she stopped multivitamins.  However, at this visit, she tells me that she has missed quite a few levothyroxine doses in the last month.  I advised her that she absolutely needs to take it every day.  Advised her to put it on the nightstand. - will check thyroid tests in 5 weeks after she starts taking it daily: TSH and fT4.  She plans to start biotin 5000 mcg daily.  I advised her that for this dose, she needs to be off biotin for at least a week before the next set of labs. - If labs are abnormal, she will need to return for repeat TFTs in 1.5 months  2.  Type 2 diabetes -She had 2 HbA1c levels in the diabetic range, in 03/2019: 6.8% at that last visit, 6.5% -She is not on medications for her diabetes.  Due to her improvement in control, we did not start Metformin at last visit -At last visit and again today we discussed about the importance of a healthy diet and also weight loss for improving glycemic control and reversing her prediabetes/mild diabetes.  She was seeing the weight management clinic, now seeing bariatrics. She is planning to have gastric sleeve.  I suspect that her diabetes will be worse after this surgery. -At this visit, she reports improvement in her diet - she already met with a counselor and will see nutrition -We rechecked her HbA1c today: 6.2%, improved - return to clinic in 6 months  Orders Placed This Encounter  Procedures  . TSH  . T4, free  . POCT glycosylated hemoglobin (Hb A1C)   Philemon Kingdom, MD PhD [facility] Endocrinology

## 2020-11-23 ENCOUNTER — Inpatient Hospital Stay: Admission: RE | Admit: 2020-11-23 | Payer: BC Managed Care – PPO | Source: Ambulatory Visit

## 2020-12-04 ENCOUNTER — Encounter: Payer: Self-pay | Admitting: Skilled Nursing Facility1

## 2020-12-04 ENCOUNTER — Encounter: Payer: BC Managed Care – PPO | Attending: Surgery | Admitting: Skilled Nursing Facility1

## 2020-12-04 ENCOUNTER — Other Ambulatory Visit: Payer: Self-pay

## 2020-12-04 DIAGNOSIS — E119 Type 2 diabetes mellitus without complications: Secondary | ICD-10-CM

## 2020-12-04 DIAGNOSIS — Z6841 Body Mass Index (BMI) 40.0 and over, adult: Secondary | ICD-10-CM | POA: Diagnosis not present

## 2020-12-04 NOTE — Progress Notes (Signed)
Nutrition Assessment for Bariatric Surgery Medical Nutrition Therapy Appt Start Time:  10:12  End Time: 11:11  Patient was seen on 12/04/2020 for Pre-Operative Nutrition Assessment. Letter of approval faxed to [facility] bariatric surgery program coordinator on 12/04/2020  Referral stated Supervised Weight Loss (SWL) visits needed: 0: pt states she is really nervous about getting surgery and states she has already started working on portion sizes and thinks maybe she could be successful without surgery so will give herself 6 months to work on behavior changes and see if that results in any weight loss and then reevaluate if she wants to move forward with having surgery.   Planned surgery: sleeve gastrectomy Pt expectation of surgery: to be healthy Pt expectation of dietitian: to help educate    NUTRITION ASSESSMENT   Anthropometrics  Start weight at [facility]: 331.7 lbs (date: 12/04/2020)  Height: 62.5 in BMI: 59.70 kg/m2     Clinical  Medical hx: PCOS, diabetes Medications: see list Labs: A1C 6.2 Notable signs/symptoms:  Any previous deficiencies? vitamin D  Micronutrient Nutrition Focused Physical Exam: Hair: No issues observed Eyes: No issues observed Mouth: No issues observed Neck: No issues observed Nails: No issues observed Skin: No issues observed  Lifestyle & Dietary Hx  Pt states she was able to get her A1C down from 6.8 to 6.2 due to reducing her fast food and fried food. Pt states she has started ordering less food at restaurants too. Pt states in the last year when finding out about her fibroid she has taken her health more seriously so has been cutting back on her portions and calorically dense foods. Pt states her husband is also want int to lose weight. Pt states she is really scared for the surgery due to the risks.   Pt state she gets healthy foods when she goes to the grocery store. Pt states her husband really loves vegetables. Pt states she is open  to trying plant based protein. Pt states she has started trying to eat breakfast. 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 Pt states dark soda causes stomach pain for her.   24-Hr Dietary Recall First Meal: protein shake or pancake + sausage  Snack: nut Second Meal: skipped or shrimp + corn + potato Snack:  Third Meal: taco salad: chips, meat, cheese, sour cream Snack:  Beverages: water, lemonade, juice, gingerale   Estimated Energy Needs Calories: 1500  NUTRITION DIAGNOSIS  Overweight/obesity ([place]-3.3) related to past poor dietary habits and physical inactivity as evidenced by patient w/ planned Sleeve Gastrectomy surgery following dietary guidelines for continued weight loss.    NUTRITION INTERVENTION  Nutrition counseling (C-1) and education (E-2) to facilitate bariatric surgery goals.   Pre-Op Goals Reviewed with the Patient . Track food and beverage intake (pen and paper, MyFitness Pal, Baritastic app, etc.) . Make healthy food choices while monitoring portion sizes . Consume 3 meals per day or try to eat every 3-5 hours . Avoid concentrated sugars and fried foods . Keep sugar & fat in the single digits per serving on food labels . Practice CHEWING your food (aim for applesauce consistency) . Practice not drinking 15 minutes before, during, and 30 minutes after each meal and snack . Avoid all carbonated beverages (ex: soda, sparkling beverages)  . Limit caffeinated beverages (ex: coffee, tea, energy drinks) . Avoid all sugar-sweetened beverages (ex: regular soda, sports drinks)  . Avoid alcohol  . Aim for 64-100 ounces of FLUID daily (with at least half of fluid intake being  plain water)  . Aim for at least 60-80 grams of PROTEIN daily . Look for a liquid protein source that contains ?15 g protein and ?5 g carbohydrate (ex: shakes, drinks, shots) . Make a list of non-food related activities . Physical activity is an important part of a healthy lifestyle so keep it moving! The  goal is to reach 150 minutes of exercise per week, including cardiovascular and weight baring activity. . Get to bed before 12am get to the gym 3 days a week; sleep in your workout outfit . Do your mindful meals and should I eat sheet with meals and snacks . continue to eat breakfast . Ensure you are having non starchy vegetables with lunch AND dinner 7 days a week  *Goals that are bolded indicate the pt would like to start working towards these  Handouts Provided Include  . Bariatric Surgery handouts (Nutrition Visits, Pre-Op Goals, Protein Shakes, Vitamins & Minerals)  Learning Style & Readiness for Change Teaching method utilized: Visual & Auditory  Demonstrated degree of understanding via: Teach Back  Readiness Level: Action Barriers to learning/adherence to lifestyle change: none stated  RD's Notes for Next Visit . Assess pts daherence to chosen gaosl     MONITORING & EVALUATION Dietary intake, weekly physical activity, body weight, and pre-op goals reached at next nutrition visit.    Next Steps  Patient is to follow up at [facility] for Pre-Op Class >2 weeks before surgery for further nutrition education.

## 2020-12-09 DIAGNOSIS — D259 Leiomyoma of uterus, unspecified: Secondary | ICD-10-CM | POA: Diagnosis not present

## 2020-12-11 ENCOUNTER — Ambulatory Visit: Payer: BC Managed Care – PPO | Admitting: Internal Medicine

## 2020-12-27 ENCOUNTER — Ambulatory Visit
Admission: RE | Admit: 2020-12-27 | Discharge: 2020-12-27 | Disposition: A | Discharge status: Home / Self Care | Payer: BC Managed Care – PPO | Source: Ambulatory Visit | Attending: Obstetrics and Gynecology | Admitting: Obstetrics and Gynecology

## 2020-12-27 DIAGNOSIS — D251 Intramural leiomyoma of uterus: Secondary | ICD-10-CM | POA: Diagnosis not present

## 2020-12-27 DIAGNOSIS — D259 Leiomyoma of uterus, unspecified: Secondary | ICD-10-CM

## 2020-12-27 MED ORDER — GADOBENATE DIMEGLUMINE 529 MG/ML IV SOLN
20.0000 mL | Freq: Once | INTRAVENOUS | Status: AC | PRN
  Administered 2020-12-27: 20 mL via INTRAVENOUS

## 2021-01-01 ENCOUNTER — Other Ambulatory Visit: Payer: BC Managed Care – PPO

## 2021-01-07 ENCOUNTER — Ambulatory Visit: Payer: BC Managed Care – PPO | Admitting: Skilled Nursing Facility1

## 2021-01-10 DIAGNOSIS — D259 Leiomyoma of uterus, unspecified: Secondary | ICD-10-CM | POA: Diagnosis not present

## 2021-02-06 ENCOUNTER — Encounter: Payer: BC Managed Care – PPO | Attending: Surgery | Admitting: Skilled Nursing Facility1

## 2021-02-06 DIAGNOSIS — Z6841 Body Mass Index (BMI) 40.0 and over, adult: Secondary | ICD-10-CM | POA: Insufficient documentation

## 2021-02-06 DIAGNOSIS — E119 Type 2 diabetes mellitus without complications: Secondary | ICD-10-CM | POA: Insufficient documentation

## 2021-02-12 DIAGNOSIS — N946 Dysmenorrhea, unspecified: Secondary | ICD-10-CM | POA: Diagnosis not present

## 2021-02-12 DIAGNOSIS — D259 Leiomyoma of uterus, unspecified: Secondary | ICD-10-CM | POA: Diagnosis not present

## 2021-02-13 ENCOUNTER — Other Ambulatory Visit: Payer: Self-pay | Admitting: Obstetrics and Gynecology

## 2021-02-13 DIAGNOSIS — D259 Leiomyoma of uterus, unspecified: Secondary | ICD-10-CM

## 2021-02-27 ENCOUNTER — Ambulatory Visit: Payer: BC Managed Care – PPO | Admitting: Skilled Nursing Facility1

## 2021-03-12 DIAGNOSIS — D259 Leiomyoma of uterus, unspecified: Secondary | ICD-10-CM | POA: Diagnosis not present

## 2021-03-25 ENCOUNTER — Telehealth (INDEPENDENT_AMBULATORY_CARE_PROVIDER_SITE_OTHER): Payer: BC Managed Care – PPO | Admitting: Family Medicine

## 2021-03-25 ENCOUNTER — Encounter: Payer: Self-pay | Admitting: Family Medicine

## 2021-03-25 VITALS — Temp 97.1°F | Wt 331.0 lb

## 2021-03-25 DIAGNOSIS — J019 Acute sinusitis, unspecified: Secondary | ICD-10-CM

## 2021-03-25 MED ORDER — AMOXICILLIN-POT CLAVULANATE 875-125 MG PO TABS: 1.0000 | ORAL_TABLET | Freq: Two times a day (BID) | ORAL | 0 refills | Status: DC

## 2021-03-25 NOTE — Progress Notes (Signed)
   Subjective:    Patient ID: Christine Nelson, female    DOB: [birth year], 30 y.o.   MRN: [id number]  HPI Virtual Visit via Video Note  I connected with the patient on 03/25/21 at  2:15 PM EDT by a video enabled telemedicine application and verified that I am speaking with the correct person using two identifiers.  Location patient: home Location provider:work or home office Persons participating in the virtual visit: patient, provider  I discussed the limitations of evaluation and management by telemedicine and the availability of in person appointments. The patient expressed understanding and agreed to proceed.   HPI: Here for 4 days of sinus congestion, PND, and ST with swollen tonsils. No cough or SOB. No body aches or NVD. She had 2 days of fever to 100.2 degrees, but the fever resolved. She tested negative for the Covid-19 virus this morning. Taking Mucinex.   ROS: See pertinent positives and negatives per HPI.  Past Medical History:  Diagnosis Date   Diabetes mellitus, type II ([place])    Hgb A1c in 03/2019 was 6.8%   Fibroids    History of prediabetes    Hypothyroid    Hypothyroidism    Obesity    PCOS (polycystic ovarian syndrome)    Uterine leiomyoma    Vitamin D deficiency     History reviewed. No pertinent surgical history.  Family History  Problem Relation Age of Onset   Hypertension Mother    Thyroid disease Father    Hypertension Maternal Grandfather    Stroke Maternal Grandfather      Current Outpatient Medications:    amoxicillin-clavulanate (AUGMENTIN) 875-125 MG tablet, Take 1 tablet by mouth 2 (two) times daily., Disp: 20 tablet, Rfl: 0   levothyroxine (SYNTHROID) 125 MCG tablet, Take 125 mcg by mouth daily., Disp: , Rfl:    LUPRON DEPOT, 19-MONTH, 3.75 MG injection, Inject into the muscle., Disp: , Rfl:   EXAM:  VITALS per patient if applicable:  GENERAL: alert, oriented, appears well and in no acute distress  HEENT: atraumatic, conjunttiva  clear, no obvious abnormalities on inspection of external nose and ears  NECK: normal movements of the head and neck  LUNGS: on inspection no signs of respiratory distress, breathing rate appears normal, no obvious gross SOB, gasping or wheezing  CV: no obvious cyanosis  MS: moves all visible extremities without noticeable abnormality  PSYCH/NEURO: pleasant and cooperative, no obvious depression or anxiety, speech and thought processing grossly intact  ASSESSMENT AND PLAN: Sinusitis, treat with Augmentin for 10 days. Alysia Penna, MD  Discussed the following assessment and plan:  No diagnosis found.     I discussed the assessment and treatment plan with the patient. The patient was provided an opportunity to ask questions and all were answered. The patient agreed with the plan and demonstrated an understanding of the instructions.   The patient was advised to call back or seek an in-person evaluation if the symptoms worsen or if the condition fails to improve as anticipated.      Review of Systems     Objective:   Physical Exam        Assessment & Plan:

## 2021-03-26 ENCOUNTER — Telehealth: Payer: BC Managed Care – PPO | Admitting: Family Medicine

## 2021-04-01 ENCOUNTER — Ambulatory Visit: Payer: BC Managed Care – PPO | Admitting: Skilled Nursing Facility1

## 2021-04-08 ENCOUNTER — Ambulatory Visit: Payer: BC Managed Care – PPO | Admitting: Family Medicine

## 2021-04-08 ENCOUNTER — Encounter: Payer: Self-pay | Admitting: Family Medicine

## 2021-04-08 ENCOUNTER — Other Ambulatory Visit: Payer: Self-pay

## 2021-04-08 ENCOUNTER — Ambulatory Visit: Payer: BC Managed Care – PPO | Admitting: Skilled Nursing Facility1

## 2021-04-08 VITALS — BP 128/80 | HR 84 | Temp 97.9°F | Wt 331.0 lb

## 2021-04-08 DIAGNOSIS — J069 Acute upper respiratory infection, unspecified: Secondary | ICD-10-CM | POA: Diagnosis not present

## 2021-04-08 MED ORDER — METHYLPREDNISOLONE 4 MG PO TBPK: ORAL_TABLET | ORAL | 0 refills | Status: DC

## 2021-04-08 NOTE — Progress Notes (Signed)
   Subjective:    Patient ID: Christine Nelson, female    DOB: 04-22-1991, 30 y.o.   MRN: [id number]  HPI Here for continuing ST and bilateral ear pain. This all started about 2 weeks ago with 2 days of fever to 100 degrees and sinus pressure. No coughing. She tested negative for the Covid-19 virus. We had a video visit with her on 03-25-21 and we gave her 10 days of Augmentin. Since then the fever and all her sinus congestion has resolved. She is drinking fluids.    Review of Systems  Constitutional: Negative.   HENT:  Positive for ear pain and sore throat. Negative for congestion, postnasal drip, sinus pressure and sinus pain.   Eyes: Negative.   Respiratory: Negative.        Objective:   Physical Exam Constitutional:      Appearance: Normal appearance.  HENT:     Right Ear: Tympanic membrane, ear canal and external ear normal.     Left Ear: Tympanic membrane, ear canal and external ear normal.     Nose: Nose normal.     Mouth/Throat:     Pharynx: Oropharynx is clear.  Eyes:     Conjunctiva/sclera: Conjunctivae normal.  Cardiovascular:     Rate and Rhythm: Normal rate and regular rhythm.     Pulses: Normal pulses.     Heart sounds: Normal heart sounds.  Pulmonary:     Effort: Pulmonary effort is normal.     Breath sounds: Normal breath sounds.  Lymphadenopathy:     Cervical: No cervical adenopathy.  Neurological:     Mental Status: She is alert.          Assessment & Plan:  She seems to have had a viral infection  with a secondary sinusitis. We treated the sinusitis with Augmentin, but she still has some lingering viral symptoms. We will treat these with a Medrol dose pack. Recheck as needed. Alysia Penna, MD

## 2021-04-10 ENCOUNTER — Other Ambulatory Visit: Payer: Self-pay

## 2021-04-10 ENCOUNTER — Ambulatory Visit (HOSPITAL_COMMUNITY)
Admission: RE | Admit: 2021-04-10 | Discharge: 2021-04-10 | Disposition: A | Discharge status: Home / Self Care | Payer: BC Managed Care – PPO | Source: Ambulatory Visit | Attending: Surgery | Admitting: Surgery

## 2021-04-11 ENCOUNTER — Ambulatory Visit: Payer: Self-pay | Admitting: Surgery

## 2021-04-16 ENCOUNTER — Telehealth: Payer: Self-pay | Admitting: Family Medicine

## 2021-04-16 ENCOUNTER — Emergency Department (HOSPITAL_BASED_OUTPATIENT_CLINIC_OR_DEPARTMENT_OTHER)
Admission: EM | Admit: 2021-04-16 | Discharge: 2021-04-16 | Disposition: A | Discharge status: Home / Self Care | Payer: BC Managed Care – PPO | Attending: Emergency Medicine | Admitting: Emergency Medicine

## 2021-04-16 ENCOUNTER — Encounter (HOSPITAL_BASED_OUTPATIENT_CLINIC_OR_DEPARTMENT_OTHER): Payer: Self-pay | Admitting: Obstetrics and Gynecology

## 2021-04-16 ENCOUNTER — Other Ambulatory Visit: Payer: Self-pay

## 2021-04-16 DIAGNOSIS — E039 Hypothyroidism, unspecified: Secondary | ICD-10-CM | POA: Insufficient documentation

## 2021-04-16 DIAGNOSIS — K219 Gastro-esophageal reflux disease without esophagitis: Secondary | ICD-10-CM | POA: Diagnosis not present

## 2021-04-16 DIAGNOSIS — J029 Acute pharyngitis, unspecified: Secondary | ICD-10-CM | POA: Insufficient documentation

## 2021-04-16 DIAGNOSIS — Z01419 Encounter for gynecological examination (general) (routine) without abnormal findings: Secondary | ICD-10-CM | POA: Diagnosis not present

## 2021-04-16 DIAGNOSIS — R079 Chest pain, unspecified: Secondary | ICD-10-CM | POA: Diagnosis not present

## 2021-04-16 DIAGNOSIS — Z79899 Other long term (current) drug therapy: Secondary | ICD-10-CM | POA: Diagnosis not present

## 2021-04-16 DIAGNOSIS — E119 Type 2 diabetes mellitus without complications: Secondary | ICD-10-CM | POA: Diagnosis not present

## 2021-04-16 DIAGNOSIS — Z6841 Body Mass Index (BMI) 40.0 and over, adult: Secondary | ICD-10-CM | POA: Diagnosis not present

## 2021-04-16 MED ORDER — PANTOPRAZOLE SODIUM 40 MG PO TBEC
40.0000 mg | DELAYED_RELEASE_TABLET | Freq: Once | ORAL | Status: AC
  Administered 2021-04-16: 40 mg via ORAL
  Filled 2021-04-16: qty 1

## 2021-04-16 MED ORDER — LANSOPRAZOLE 30 MG PO CPDR: 30.0000 mg | DELAYED_RELEASE_CAPSULE | Freq: Every day | ORAL | 0 refills | Status: DC

## 2021-04-16 NOTE — ED Triage Notes (Signed)
Patient reports x3 weeks ago she had a sinus infection that was treated successfully with antibiotics and then reports after that she developed an ear pain and a sore throat. Patient reports she was prescribed a steroid that helped the ear ache but did not take away the sore throat. Patient reports she has had the sore throat x4 days.

## 2021-04-16 NOTE — Telephone Encounter (Signed)
Patient called today because she is having bariatric surgery and was told by her bariatric surgeon that her PCP would be able to do lab work for her. Patient says that she was sent an email that included labs and will send that over in a MyChart message. Patient would like to know if lab orders could be put in and done here.  Patient also says that recently saw Dr. Sarajane Jews for issues with an ear ache and sore throat. She says that she was given antibiotics after her initial visit and after a week, she was still having issues with sore throat and ear ache. She came back for another visit with Dr. Sarajane Jews and was given a steroid. She says that the steroid helped with the ear ache but she is still having issues with sore throat. She says that she would like to know if there is anything else can be done since she is still experiencing a sore throat.  Patient says that she can have appointments for lab work and the issues with her throat on the same day.   Patient's contact number is [phone].  Please advise.

## 2021-04-16 NOTE — ED Provider Notes (Signed)
[facility] Provider Note: Georgena Spurling, MD, FACEP  CSN: [id number] MRN: [id number] ARRIVAL: 04/16/21 at 2217 ROOM: DB010/DB010   CHIEF COMPLAINT  Sore Throat   HISTORY OF PRESENT ILLNESS  04/16/21 11:25 PM Christine Nelson is a 30 y.o. female who was treated with Augmentin for a sinus infection on 03/25/2021.  Along with the sinus infection she had a sore throat.  After completing the Augmentin the sore throat works and along with ear pain.  She was placed on a steroid taper which resolved the ear pain but she continues to have a sore throat.  She rates her sore throat as a 6 out of 10, worse with swallowing.  She also has had occasional burning in her upper chest.  She has never been formally diagnosed with acid reflux.  She no longer has ear pain there was.  She has not had a fever.   Past Medical History:  Diagnosis Date   Diabetes mellitus, type II ([place])    Hgb A1c in 03/2019 was 6.8%   Fibroids    History of prediabetes    Hypothyroidism    Obesity    PCOS (polycystic ovarian syndrome)    Uterine leiomyoma    Vitamin D deficiency     History reviewed. No pertinent surgical history.  Family History  Problem Relation Age of Onset   Hypertension Mother    Thyroid disease Father    Hypertension Maternal Grandfather    Stroke Maternal Grandfather     Social History   Tobacco Use   Smoking status: Never   Smokeless tobacco: Never  Vaping Use   Vaping Use: Never used  Substance Use Topics   Alcohol use: Never   Drug use: Never    Prior to Admission medications   Medication Sig Start Date End Date Taking? Authorizing Provider  lansoprazole (PREVACID) 30 MG capsule Take 1 capsule (30 mg total) by mouth daily at 12 noon. 04/16/21  Yes Shakti Fleer, MD  levothyroxine (SYNTHROID) 125 MCG tablet Take 125 mcg by mouth daily. 01/08/20   [provider]  LUPRON DEPOT, 23-MONTH, 3.75 MG injection Inject into the muscle. 11/08/20   [provider]     Allergies Patient has no known allergies.   REVIEW OF SYSTEMS  Negative except as noted here or in the History of Present Illness.   PHYSICAL EXAMINATION  Initial Vital Signs Blood pressure (!) 154/98, pulse (!) 103, temperature 98 F (36.7 C), temperature source Oral, resp. rate 16, SpO2 98 %.  Examination General: Well-developed, well-nourished female in no acute distress; appearance consistent with age of record HENT: normocephalic; atraumatic; TMs normal; no pharyngeal erythema or exudate; thyroid nontender Eyes: pupils equal, round and reactive to light; extraocular muscles intact Neck: supple Heart: regular rate and rhythm Lungs: clear to auscultation bilaterally Abdomen: soft; nondistended; nontender; bowel sounds present Extremities: No deformity; full range of motion; pulses normal Neurologic: Awake, alert and oriented; motor function intact in all extremities and symmetric; no facial droop Skin: Warm and dry Psychiatric: Normal mood and affect   RESULTS  Summary of this visit's results, reviewed and interpreted by myself:   EKG Interpretation  Date/Time:    Ventricular Rate:    PR Interval:    QRS Duration:   QT Interval:    QTC Calculation:   R Axis:     Text Interpretation:         Laboratory Studies: No results found for this or any previous visit (from the past  24 hour(s)). Imaging Studies: No results found.  ED COURSE and MDM  Nursing notes, initial and subsequent vitals signs, including pulse oximetry, reviewed and interpreted by myself.  Vitals:   04/16/21 2244  BP: (!) 154/98  Pulse: (!) 103  Resp: 16  Temp: 98 F (36.7 C)  TempSrc: Oral  SpO2: 98%   Medications  pantoprazole (PROTONIX) EC tablet 40 mg (has no administration in time range)    The patient's sore throat is unlikely to be strep as it is persisted for several weeks, has not had an associated fever, she was treated with Augmentin which should have resolved the  strep, and her throat is normal in appearance.  I suspect she is having acid reflux especially given that she has intermittent burning in her upper chest.  We will trial a PPI and refer to ENT if symptoms persist.  She is also preparing to have bariatric surgery at [facility] surgery and she was advised to call this to the attention of her surgeon.  PROCEDURES  Procedures   ED DIAGNOSES     ICD-10-CM   1. Gastroesophageal reflux disease without esophagitis  K21.9          Jeanetta Alonzo, MD 04/16/21 2342

## 2021-04-17 NOTE — Telephone Encounter (Signed)
ATC pt, no answer. Left VM to call office and schedule an appointment with PCP.

## 2021-04-21 ENCOUNTER — Other Ambulatory Visit: Payer: Self-pay

## 2021-04-21 ENCOUNTER — Encounter: Payer: BC Managed Care – PPO | Attending: Surgery | Admitting: Skilled Nursing Facility1

## 2021-04-21 DIAGNOSIS — Z6841 Body Mass Index (BMI) 40.0 and over, adult: Secondary | ICD-10-CM | POA: Insufficient documentation

## 2021-04-21 NOTE — Progress Notes (Signed)
Supervised Weight Loss Visit Bariatric Nutrition Education  Planned Surgery: sleeve  Pt completed visits.   Pt has cleared nutrition requirements.    NUTRITION ASSESSMENT  Anthropometrics  Start weight at [facility]: 331.7 lbs (date: 12/04/2020) Today's weight: 331.3 lbs BMI: 59.59 kg/m2    Clinical  Medical hx: PCOS, diabetes Medications: see list Labs: A1C 6.2 Notable signs/symptoms:  Any previous deficiencies? vitamin D  Lifestyle & Dietary Hx  Pt states she is no longer interested in trying to make changes on her own previous to getting surgery. Pt states eh she has not been making her meals from home stating her neighbors are unhygienic so she has fear her foods will have bugs in it. Pt states she will be moving out of her current place to live with her husbands father so they will make more foods from home. Pt state she has been working on Comptroller choices with her meals when she does eat. Pt states she is aware after surgery due to the strictness of the diet she worries about getting bored with the foods in each phase. Pt states she recently went to the ED for a soar throat and discovered she has GERD so has been started on an acid reduction medication: surgeon is aware.   Estimated daily fluid intake: unknown oz Supplements:  Current average weekly physical activity: 3 days a  week 30 minutes walking/gym  24-Hr Dietary Recall Breakfast: oatmeal Lunch: fast food salad Dinner smoothie + flat bread  Drink: juice, water  Estimated Energy Needs Calories: 1500   NUTRITION DIAGNOSIS  Overweight/obesity ([place]-3.3) related to past poor dietary habits and physical inactivity as evidenced by patient w/ planned sleeve gastrectomy surgery following dietary guidelines for continued weight loss.   NUTRITION INTERVENTION  Nutrition counseling (C-1) and education (E-2) to facilitate bariatric surgery goals.   Learning Style & Readiness for Change Teaching method utilized:  Visual & Auditory  Demonstrated degree of understanding via: Teach Back  Readiness Level: contemplative  Barriers to learning/adherence to lifestyle change: known at this time    MONITORING & EVALUATION Dietary intake, weekly physical activity, body weight, and pre-op goals   Next Steps  Patient is to return to [facility] for pre-op class Pt has completed visits. No further supervised visits required

## 2021-04-22 ENCOUNTER — Other Ambulatory Visit: Payer: Self-pay

## 2021-04-22 DIAGNOSIS — F329 Major depressive disorder, single episode, unspecified: Secondary | ICD-10-CM | POA: Diagnosis not present

## 2021-04-23 ENCOUNTER — Ambulatory Visit: Payer: BC Managed Care – PPO | Admitting: Family Medicine

## 2021-04-28 DIAGNOSIS — Z3202 Encounter for pregnancy test, result negative: Secondary | ICD-10-CM | POA: Diagnosis not present

## 2021-04-28 DIAGNOSIS — D251 Intramural leiomyoma of uterus: Secondary | ICD-10-CM | POA: Diagnosis not present

## 2021-04-28 DIAGNOSIS — D259 Leiomyoma of uterus, unspecified: Secondary | ICD-10-CM | POA: Diagnosis not present

## 2021-04-30 DIAGNOSIS — F329 Major depressive disorder, single episode, unspecified: Secondary | ICD-10-CM | POA: Diagnosis not present

## 2021-05-01 ENCOUNTER — Other Ambulatory Visit: Payer: Self-pay

## 2021-05-01 ENCOUNTER — Encounter: Payer: Self-pay | Admitting: Family Medicine

## 2021-05-01 ENCOUNTER — Ambulatory Visit: Payer: BC Managed Care – PPO | Admitting: Family Medicine

## 2021-05-01 VITALS — BP 112/82 | HR 85 | Temp 97.8°F | Wt 333.2 lb

## 2021-05-01 DIAGNOSIS — K219 Gastro-esophageal reflux disease without esophagitis: Secondary | ICD-10-CM

## 2021-05-01 LAB — CBC WITH DIFFERENTIAL/PLATELET
Basophils Absolute: 0 10*3/uL (ref 0.0–0.1)
Basophils Relative: 0.4 % (ref 0.0–3.0)
Eosinophils Absolute: 0.1 10*3/uL (ref 0.0–0.7)
Eosinophils Relative: 1.6 % (ref 0.0–5.0)
HCT: 37.9 % (ref 36.0–46.0)
Hemoglobin: 12 g/dL (ref 12.0–15.0)
Lymphocytes Relative: 35.8 % (ref 12.0–46.0)
Lymphs Abs: 2.7 10*3/uL (ref 0.7–4.0)
MCHC: 31.8 g/dL (ref 30.0–36.0)
MCV: 80.5 fl (ref 78.0–100.0)
Monocytes Absolute: 0.3 10*3/uL (ref 0.1–1.0)
Monocytes Relative: 3.4 % (ref 3.0–12.0)
Neutro Abs: 4.4 10*3/uL (ref 1.4–7.7)
Neutrophils Relative %: 58.8 % (ref 43.0–77.0)
Platelets: 317 10*3/uL (ref 150.0–400.0)
RBC: 4.7 Mil/uL (ref 3.87–5.11)
RDW: 17.1 % — ABNORMAL HIGH (ref 11.5–15.5)
WBC: 7.5 10*3/uL (ref 4.0–10.5)

## 2021-05-01 LAB — COMPREHENSIVE METABOLIC PANEL
ALT: 31 U/L (ref 0–35)
AST: 19 U/L (ref 0–37)
Albumin: 3.9 g/dL (ref 3.5–5.2)
Alkaline Phosphatase: 99 U/L (ref 39–117)
BUN: 9 mg/dL (ref 6–23)
CO2: 27 mEq/L (ref 19–32)
Calcium: 9.4 mg/dL (ref 8.4–10.5)
Chloride: 103 mEq/L (ref 96–112)
Creatinine, Ser: 0.85 mg/dL (ref 0.40–1.20)
GFR: 93.6 mL/min (ref >60.00)
Glucose, Bld: 106 mg/dL — ABNORMAL HIGH (ref 70–99)
Potassium: 4.3 mEq/L (ref 3.5–5.1)
Sodium: 139 mEq/L (ref 135–145)
Total Bilirubin: 0.4 mg/dL (ref 0.2–1.2)
Total Protein: 7 g/dL (ref 6.0–8.3)

## 2021-05-01 LAB — TSH: TSH: 11.64 u[IU]/mL — ABNORMAL HIGH (ref 0.35–5.50)

## 2021-05-01 LAB — LIPID PANEL
Cholesterol: 153 mg/dL (ref 0–200)
HDL: 41.1 mg/dL (ref >39.00)
LDL Cholesterol: 99 mg/dL (ref 0–99)
NonHDL: 111.86
Total CHOL/HDL Ratio: 4
Triglycerides: 62 mg/dL (ref 0.0–149.0)
VLDL: 12.4 mg/dL (ref 0.0–40.0)

## 2021-05-01 LAB — HEMOGLOBIN A1C: Hgb A1c MFr Bld: 7 % — ABNORMAL HIGH (ref 4.6–6.5)

## 2021-05-01 LAB — VITAMIN B12: Vitamin B-12: 242 pg/mL (ref 211–911)

## 2021-05-01 LAB — FOLATE: Folate: 5.6 ng/mL — ABNORMAL LOW (ref >5.9)

## 2021-05-01 LAB — VITAMIN D 25 HYDROXY (VIT D DEFICIENCY, FRACTURES): VITD: 17.28 ng/mL — ABNORMAL LOW (ref 30.00–100.00)

## 2021-05-01 MED ORDER — LANSOPRAZOLE 30 MG PO CPDR: 30.0000 mg | DELAYED_RELEASE_CAPSULE | Freq: Every day | ORAL | 1 refills | Status: DC

## 2021-05-01 NOTE — Progress Notes (Signed)
Subjective:    Patient ID: Christine Nelson, female    DOB: [birth year], 30 y.o.   MRN: [id number]  Chief Complaint  Patient presents with   Follow-up    HPI Patient was seen today for f/u from ED visit 7/13 for GERD. Pt had numbness in throat and dysphagia.  Symptoms initially thought 2/2 strep throat.  Treated with antibiotics and steroid at a subsequent visit.  Patient noted brief relief in symptoms while on the medications then recurrence.  Now with improvement in symptoms since taking Prevacid 30 mg daily when remembers medication.  Pt previously noted symptoms including burning in chest and irritation of throat with eating pizza and spaghetti.  Patient requesting labs for bariatric surgery.  Followed by [facility] surgery.  Considering gastric sleeve.  Had an EGD scheduled in a few weeks.  Pt has a degree in biology and is completing a masters.  Still considering medical school but continues to doubt herself.  Past Medical History:  Diagnosis Date   Diabetes mellitus, type II ([place])    Hgb A1c in 03/2019 was 6.8%   Fibroids    History of prediabetes    Hypothyroidism    Obesity    PCOS (polycystic ovarian syndrome)    Uterine leiomyoma    Vitamin D deficiency     No Known Allergies  ROS General: Denies fever, chills, night sweats, changes in weight, changes in appetite HEENT: Denies headaches, ear pain, changes in vision, rhinorrhea, sore throat CV: Denies CP, palpitations, SOB, orthopnea Pulm: Denies SOB, cough, wheezing GI: Denies abdominal pain, nausea, vomiting, diarrhea, constipation  + acid reflux GU: Denies dysuria, hematuria, frequency, vaginal discharge Msk: Denies muscle cramps, joint pains Neuro: Denies weakness, numbness, tingling Skin: Denies rashes, bruising Psych: Denies depression, anxiety, hallucinations    Objective:    Blood pressure 112/82, pulse 85, temperature 97.8 F (36.6 C), temperature source Oral, weight (!) 333 lb 3.2 oz (151.1  kg), SpO2 98 %. Body mass index is 59.97 kg/m.   Gen. Pleasant, well-nourished, in no distress, common normal affect, obese HEENT: [place]/AT, face symmetric, conjunctiva clear, no scleral icterus, PERRLA, EOMI, nares patent without drainage, pharynx without erythema or exudate. Lungs: no accessory muscle use Cardiovascular: RRR, no peripheral edema Musculoskeletal: No deformities, no cyanosis or clubbing, normal tone Neuro:  A&Ox3, CN II-XII intact, normal gait Skin:  Warm, no lesions/ rash   Wt Readings from Last 3 Encounters:  05/01/21 (!) 333 lb 3.2 oz (151.1 kg)  04/21/21 (!) 331 lb 1.6 oz (150.2 kg)  04/08/21 (!) 331 lb (150.1 kg)    Lab Results  Component Value Date   WBC 10.7 08/16/2020   HGB 11.0 (L) 08/16/2020   HCT 34.7 (L) 08/16/2020   PLT 416 (H) 08/16/2020   GLUCOSE 98 04/30/2020   CHOL 123 04/30/2020   TRIG 50 04/30/2020   HDL 43 04/30/2020   LDLCALC 68 04/30/2020   ALT 16 04/30/2020   AST 16 04/30/2020   NA 141 04/30/2020   K 4.7 04/30/2020   CL 101 04/30/2020   CREATININE 0.75 04/30/2020   BUN 7 04/30/2020   CO2 22 04/30/2020   TSH 5.46 (H) 08/16/2020   HGBA1C 6.2 (A) 11/21/2020    Assessment/Plan:  Gastroesophageal reflux disease, unspecified whether esophagitis present -Discussed possible causes including weight gain, hiatal hernia -Avoid foods known to cause symptoms -Continue lansoprazole 30 mg daily -Encouraged to keep appointment for EGD - Plan: lansoprazole (PREVACID) 30 MG capsule, CBC with Differential/Platelet, CMP, Vitamin B12  Morbid obesity (HCC) -BMI 59.97 -considering gastric sleeve -Continue lifestyle modifications including diet and exercise -continue f/u with [facility] surgery - Plan: CBC with Differential/Platelet, CMP, TSH, Hemoglobin A1c, POCT urine pregnancy, Lipid panel, Vitamin D, 25-hydroxy, Iron and TIBC, Vitamin B12, Folate  F/u as needed in the next few months for CPE  Grier Mitts, MD

## 2021-05-02 LAB — IRON AND TIBC
Iron Saturation: 13 % — ABNORMAL LOW (ref 15–55)
Iron: 49 ug/dL (ref 27–159)
Total Iron Binding Capacity: 365 ug/dL (ref 250–450)
UIBC: 316 ug/dL (ref 131–425)

## 2021-05-05 DIAGNOSIS — F329 Major depressive disorder, single episode, unspecified: Secondary | ICD-10-CM | POA: Diagnosis not present

## 2021-05-07 ENCOUNTER — Other Ambulatory Visit: Payer: Self-pay | Admitting: Family Medicine

## 2021-05-07 DIAGNOSIS — E559 Vitamin D deficiency, unspecified: Secondary | ICD-10-CM

## 2021-05-07 MED ORDER — VITAMIN D (ERGOCALCIFEROL) 1.25 MG (50000 UNIT) PO CAPS: 50000.0000 [IU] | ORAL_CAPSULE | ORAL | 0 refills | Status: DC

## 2021-05-08 ENCOUNTER — Encounter (HOSPITAL_COMMUNITY): Payer: Self-pay | Admitting: Surgery

## 2021-05-08 ENCOUNTER — Other Ambulatory Visit: Payer: Self-pay

## 2021-05-08 DIAGNOSIS — F509 Eating disorder, unspecified: Secondary | ICD-10-CM | POA: Diagnosis not present

## 2021-05-09 ENCOUNTER — Encounter (HOSPITAL_COMMUNITY): Payer: Self-pay | Admitting: Surgery

## 2021-05-09 ENCOUNTER — Ambulatory Visit (HOSPITAL_COMMUNITY): Payer: BC Managed Care – PPO | Admitting: Certified Registered Nurse Anesthetist

## 2021-05-09 ENCOUNTER — Encounter (HOSPITAL_COMMUNITY)
Admission: RE | Disposition: A | Discharge status: Home / Self Care | Payer: Self-pay | Source: Home / Self Care | Attending: Surgery

## 2021-05-09 ENCOUNTER — Ambulatory Visit (HOSPITAL_COMMUNITY)
Admission: RE | Admit: 2021-05-09 | Discharge: 2021-05-09 | Disposition: A | Discharge status: Home / Self Care | Payer: BC Managed Care – PPO | Attending: Surgery | Admitting: Surgery

## 2021-05-09 ENCOUNTER — Other Ambulatory Visit: Payer: Self-pay

## 2021-05-09 DIAGNOSIS — K2100 Gastro-esophageal reflux disease with esophagitis, without bleeding: Secondary | ICD-10-CM | POA: Diagnosis not present

## 2021-05-09 DIAGNOSIS — K208 Other esophagitis without bleeding: Secondary | ICD-10-CM | POA: Diagnosis not present

## 2021-05-09 DIAGNOSIS — Z79899 Other long term (current) drug therapy: Secondary | ICD-10-CM | POA: Diagnosis not present

## 2021-05-09 DIAGNOSIS — E039 Hypothyroidism, unspecified: Secondary | ICD-10-CM | POA: Diagnosis not present

## 2021-05-09 DIAGNOSIS — Z79890 Hormone replacement therapy (postmenopausal): Secondary | ICD-10-CM | POA: Insufficient documentation

## 2021-05-09 DIAGNOSIS — Z01818 Encounter for other preprocedural examination: Secondary | ICD-10-CM | POA: Insufficient documentation

## 2021-05-09 DIAGNOSIS — Z6841 Body Mass Index (BMI) 40.0 and over, adult: Secondary | ICD-10-CM | POA: Diagnosis not present

## 2021-05-09 DIAGNOSIS — E119 Type 2 diabetes mellitus without complications: Secondary | ICD-10-CM | POA: Insufficient documentation

## 2021-05-09 DIAGNOSIS — K2980 Duodenitis without bleeding: Secondary | ICD-10-CM | POA: Diagnosis not present

## 2021-05-09 DIAGNOSIS — Z823 Family history of stroke: Secondary | ICD-10-CM | POA: Diagnosis not present

## 2021-05-09 DIAGNOSIS — Z8349 Family history of other endocrine, nutritional and metabolic diseases: Secondary | ICD-10-CM | POA: Diagnosis not present

## 2021-05-09 DIAGNOSIS — Z7951 Long term (current) use of inhaled steroids: Secondary | ICD-10-CM | POA: Insufficient documentation

## 2021-05-09 DIAGNOSIS — K219 Gastro-esophageal reflux disease without esophagitis: Secondary | ICD-10-CM | POA: Diagnosis not present

## 2021-05-09 DIAGNOSIS — K319 Disease of stomach and duodenum, unspecified: Secondary | ICD-10-CM | POA: Diagnosis not present

## 2021-05-09 DIAGNOSIS — Z8249 Family history of ischemic heart disease and other diseases of the circulatory system: Secondary | ICD-10-CM | POA: Diagnosis not present

## 2021-05-09 DIAGNOSIS — E6609 Other obesity due to excess calories: Secondary | ICD-10-CM | POA: Diagnosis not present

## 2021-05-09 DIAGNOSIS — K3189 Other diseases of stomach and duodenum: Secondary | ICD-10-CM | POA: Diagnosis not present

## 2021-05-09 HISTORY — PX: BIOPSY: SHX5522

## 2021-05-09 HISTORY — PX: ESOPHAGOGASTRODUODENOSCOPY: SHX5428

## 2021-05-09 LAB — PREGNANCY, URINE: Preg Test, Ur: NEGATIVE

## 2021-05-09 SURGERY — EGD (ESOPHAGOGASTRODUODENOSCOPY)
Anesthesia: Monitor Anesthesia Care

## 2021-05-09 MED ORDER — PROPOFOL 10 MG/ML IV BOLUS
INTRAVENOUS | Status: DC | PRN
  Administered 2021-05-09 (×3): 40 mg via INTRAVENOUS

## 2021-05-09 MED ORDER — LACTATED RINGERS IV SOLN: INTRAVENOUS | Status: DC

## 2021-05-09 MED ORDER — LIDOCAINE 2% (20 MG/ML) 5 ML SYRINGE
INTRAMUSCULAR | Status: DC | PRN
  Administered 2021-05-09: 50 mg via INTRAVENOUS

## 2021-05-09 MED ORDER — PROPOFOL 500 MG/50ML IV EMUL
INTRAVENOUS | Status: DC | PRN
  Administered 2021-05-09: 125 ug/kg/min via INTRAVENOUS

## 2021-05-09 NOTE — Discharge Instructions (Signed)
YOU HAD AN ENDOSCOPIC PROCEDURE TODAY: Refer to the procedure report and other information in the discharge instructions given to you for any specific questions about what was found during the examination. If this information does not answer your questions, please call [facility] office at [phone] to clarify.   YOU SHOULD EXPECT: Some feelings of bloating in the abdomen. Passage of more gas than usual. Walking can help get rid of the air that was put into your GI tract during the procedure and reduce the bloating.   DIET: Your first meal following the procedure should be a light meal and then it is ok to progress to your normal diet. A half-sandwich or bowl of soup is an example of a good first meal. Heavy or fried foods are harder to digest and may make you feel nauseous or bloated. Drink plenty of fluids but you should avoid alcoholic beverages for 24 hours.  ACTIVITY: Your care partner should take you home directly after the procedure. You should plan to take it easy, moving slowly for the rest of the day. You can resume normal activity the day after the procedure however YOU SHOULD NOT DRIVE, use power tools, machinery or perform tasks that involve climbing or major physical exertion for 24 hours (because of the sedation medicines used during the test).   SYMPTOMS TO REPORT IMMEDIATELY: A gastroenterologist can be reached at any hour. Please call [phone]  for any of the following symptoms:   Following upper endoscopy (EGD, EUS, ERCP, esophageal dilation) Vomiting of blood or coffee ground material  New, significant abdominal pain  New, significant chest pain or pain under the shoulder blades  Painful or persistently difficult swallowing  New shortness of breath  Black, tarry-looking or red, bloody stools  FOLLOW UP:  If any biopsies were taken you will be contacted by phone or by letter within the next 1-3 weeks. Call [phone]  if you have not heard about the  biopsies in 3 weeks.  Please also call with any specific questions about appointments or follow up tests.

## 2021-05-09 NOTE — Op Note (Signed)
[facility] Patient Name: Christine Nelson Procedure Date: 05/09/2021 MRN: [id number] Attending MD: Felicie Morn ,  Date of Birth: 1990-12-06 CSN: [id number] Age: 30 Admit Type: Outpatient Procedure:                Upper GI endoscopy Indications:              Obesity due to excess calories Providers:                Felicie Morn, Carlyn Reichert, RN, Cherylynn Ridges, Technician, Dellie Catholic Referring MD:              Medicines:                Monitored Anesthesia Care Complications:            No immediate complications. Estimated Blood Loss:     Estimated blood loss was minimal. Procedure:                Pre-Anesthesia Assessment:                           - Prior to the procedure, a History and Physical                            was performed, and patient medications and                            allergies were reviewed. The patient is competent.                            The risks and benefits of the procedure and the                            sedation options and risks were discussed with the                            patient. All questions were answered and informed                            consent was obtained. Patient identification and                            proposed procedure were verified by the physician                            in the pre-procedure area. Mental Status                            Examination: alert and oriented. Airway                            Examination: normal oropharyngeal airway and neck  mobility. Respiratory Examination: clear to                            auscultation. CV Examination: normal. ASA Grade                            Assessment: II - A patient with mild systemic                            disease. After reviewing the risks and benefits,                            the patient was deemed in satisfactory condition to                            undergo  the procedure. The anesthesia plan was to                            use moderate sedation / analgesia (conscious                            sedation). Immediately prior to administration of                            medications, the patient was re-assessed for                            adequacy to receive sedatives. The heart rate,                            respiratory rate, oxygen saturations, blood                            pressure, adequacy of pulmonary ventilation, and                            response to care were monitored throughout the                            procedure. The physical status of the patient was                            re-assessed after the procedure.                           After obtaining informed consent, the endoscope was                            passed under direct vision. Throughout the                            procedure, the patient's blood pressure, pulse, and  oxygen saturations were monitored continuously. The                            [id number] Olympus endoscope was introduced                            through the mouth, and advanced to the second part                            of duodenum. The upper GI endoscopy was                            accomplished without difficulty. The patient                            tolerated the procedure well. Scope In: Scope Out: Findings:      Moderately severe esophagitis with no bleeding was found. Biopsies were       taken with a cold forceps for histology. Verification of patient       identification for the specimen was done. Estimated blood loss was       minimal.      The entire examined stomach was normal. Biopsies were taken with a cold       forceps for Helicobacter pylori testing. Verification of patient       identification for the specimen was done. Estimated blood loss was       minimal.      Diffuse moderate inflammation was found in the first  portion of the       duodenum. Biopsies were taken with a cold forceps for histology.       Verification of patient identification for the specimen was done.       Estimated blood loss was minimal.      The gastroesophageal flap valve was visualized endoscopically and       classified as Hill Grade II (fold present, opens with respiration). Impression:               - Moderately severe non-erosive esophagitis with no                            bleeding. Biopsied.                           - Normal stomach. Biopsied.                           - Duodenitis. Biopsied.                           - Gastroesophageal flap valve classified as Hill                            Grade II (fold present, opens with respiration). Moderate Sedation:      Moderate (conscious) sedation was personally administered by an       anesthesia professional. The following parameters were monitored: oxygen       saturation, heart rate, blood pressure, and response to care. Total  physician intraservice time was 15 minutes. Recommendation:           - Discharge patient to home.                           - Resume previous diet.                           - I'm concerned about [name] having reflux                            issues after sleeve gastrectomy due to the                            appearance of her lower esophageal sphincter and                            the presence of esophagitis. I also feel her                            Diabetes would benefit more from a bypass than a                            sleeve as was demonstrated by the [facility] Individualized Metabolic Score                            calculator presented during her initial                            consultation. Reflux is a difficult issue to                            predict, it may improve after sleeve gastrectomy                            due to weight loss. Prior to scheduling  surgery, I                            will schedule an office visit to discuss this in                            detail with the patient. She also is working with                            her primary care physician on her thyroid levels. Procedure Code(s):        --- Professional ---                           [phone], Esophagogastroduodenoscopy, flexible,  transoral; with biopsy, single or multiple Diagnosis Code(s):        --- Professional ---                           K20.80, Other esophagitis without bleeding                           K29.80, Duodenitis without bleeding                           E66.09, Other obesity due to excess calories CPT copyright 2019 [employer]. All rights reserved. The codes documented in this report are preliminary and upon coder review may  be revised to meet current compliance requirements. Carrizo Hill,  05/09/2021 10:10:29 AM Number of Addenda: 0

## 2021-05-09 NOTE — Anesthesia Postprocedure Evaluation (Signed)
Anesthesia Post Note  Patient: Environmental manager  Procedure(s) Performed: ESOPHAGOGASTRODUODENOSCOPY (EGD) with biopsies     Patient location during evaluation: PACU Anesthesia Type: MAC Level of consciousness: awake and alert Pain management: pain level controlled Vital Signs Assessment: post-procedure vital signs reviewed and stable Respiratory status: spontaneous breathing, nonlabored ventilation and respiratory function stable Cardiovascular status: blood pressure returned to baseline and stable Postop Assessment: no apparent nausea or vomiting Anesthetic complications: no   No notable events documented.  Last Vitals:  Vitals:   05/09/21 1011 05/09/21 1021  BP: 108/68 119/76  Pulse: 84 88  Resp: 15 18  Temp:    SpO2: 100% 98%    Last Pain:  Vitals:   05/09/21 1021  TempSrc:   PainSc: 0-No pain                 Pervis Hocking

## 2021-05-09 NOTE — H&P (Signed)
Admitting Physician: Nickola Major Laila Myhre  Service: Bariatric surgery  CC: GERD  Subjective   HPI: Christine Nelson is an 30 y.o. female who is here for elective EGD prior to bariatric surgery  Past Medical History:  Diagnosis Date   Diabetes mellitus, type II ([place])    Hgb A1c in 03/2019 was 6.8%   Fibroids    History of prediabetes    Hypothyroidism    Obesity    PCOS (polycystic ovarian syndrome)    Uterine leiomyoma    Vitamin D deficiency     History reviewed. No pertinent surgical history.  Family History  Problem Relation Age of Onset   Hypertension Mother    Thyroid disease Father    Hypertension Maternal Grandfather    Stroke Maternal Grandfather     Social:  reports that she has never smoked. She has never used smokeless tobacco. She reports that she does not drink alcohol and does not use drugs.  Allergies: No Known Allergies  Medications: Current Outpatient Medications  Medication Instructions   albuterol (VENTOLIN HFA) 108 (90 Base) MCG/ACT inhaler 2 puffs, Inhalation, Every 6 hours PRN   lansoprazole (PREVACID) 30 mg, Oral, Daily   levothyroxine (SYNTHROID) 125 mcg, Oral, Daily   Lupron Depot (70-Month) 3.75 mg, Intramuscular, Every 28 days   Vitamin D (Ergocalciferol) (DRISDOL) 50,000 Units, Oral, Every 7 days    ROS - all of the below systems have been reviewed with the patient and positives are indicated with bold text General: chills, fever or night sweats Eyes: blurry vision or double vision ENT: epistaxis or sore throat Allergy/Immunology: itchy/watery eyes or nasal congestion Hematologic/Lymphatic: bleeding problems, blood clots or swollen lymph nodes Endocrine: temperature intolerance or unexpected weight changes Breast: new or changing breast lumps or nipple discharge Resp: cough, shortness of breath, or wheezing CV: chest pain or dyspnea on exertion GI: as per HPI GU: dysuria, trouble voiding, or hematuria MSK: joint pain or  joint stiffness Neuro: TIA or stroke symptoms Derm: pruritus and skin lesion changes Psych: anxiety and depression  Objective   PE Height 5' 2.5" (1.588 m), weight (!) 149.7 kg. Constitutional: NAD; conversant; no deformities Eyes: Moist conjunctiva; no lid lag; anicteric; PERRL Neck: Trachea midline; no thyromegaly Lungs: Normal respiratory effort; no tactile fremitus CV: RRR; no palpable thrills; no pitting edema GI: Abd Soft, nontender; no palpable hepatosplenomegaly MSK: Normal range of motion of extremities; no clubbing/cyanosis Psychiatric: Appropriate affect; alert and oriented x3 Lymphatic: No palpable cervical or axillary lymphadenopathy  No results found for this or any previous visit (from the past 24 hour(s)).  Imaging Orders  No imaging studies ordered today     Assessment and Plan   Ms. Grosman is a 30 year old female presenting for bariatric surgery consultation. She has a BMI of 59.40, and type 2 diabetes. I entered the patient's information into the [facility] bariatric risk-benefit calculator. I used this as a outlined to facilitate our discussion of the risks, and benefits of bariatric surgery, specifically comparing robotic sleeve gastrectomy versus robotic Roux-en-Y gastric bypass. I also used the [facility] Individualized Metabolic Surgery Score calculator to discuss the surgery's effect on diabetes - this calculator stratified her as having mild diabetes and recommended gastric bypass for 90% chance of diabetes remission vs. 75% chance with a sleeve. The patient is interested in pursuing a sleeve gastrectomy. We will initiate the preoperative evaluation to include the following: - Bloodwork - notably her TSH was elevated, she will see her PCP for  adjustment - Nutrition consult - Completed with Sandie Ano - CXR 04/10/21 with no acute cardiopulmonary disease - EKG 04/10/21 with normal sinus rhythm - Psychology evaluation - completed with Dr. Ardath Sax   Today  she presents for EGD with biopsy - I discussed the indications, risks, benefits and alternatives to preoperative EGD with the patient who granted consent to proceed.  We will proceed as scheduled.   After this evaluation is complete, the patient will return to the office to conitinue our discussion and work to schedule surgery.   Felicie Morn, MD  [facility] Use [email or web address] to contact on call provider

## 2021-05-09 NOTE — Transfer of Care (Signed)
Immediate Anesthesia Transfer of Care Note  Patient: Christine Nelson  Procedure(s) Performed: ESOPHAGOGASTRODUODENOSCOPY (EGD) with biopsies  Patient Location: Endoscopy Unit  Anesthesia Type:MAC  Level of Consciousness: awake, alert , oriented and patient cooperative  Airway & Oxygen Therapy: Patient Spontanous Breathing and Patient connected to face mask  Post-op Assessment: Report given to RN and Post -op Vital signs reviewed and stable  Post vital signs: Reviewed and stable  Last Vitals:  Vitals Value Taken Time  BP    Temp    Pulse 89 05/09/21 1003  Resp 14 05/09/21 1003  SpO2 100 % 05/09/21 1003  Vitals shown include unvalidated device data.  Last Pain:  Vitals:   05/09/21 0919  TempSrc: Oral  PainSc: 0-No pain         Complications: No notable events documented.

## 2021-05-09 NOTE — Anesthesia Preprocedure Evaluation (Addendum)
Anesthesia Evaluation  Patient identified by MRN, date of birth, ID band Patient awake    Reviewed: Allergy & Precautions, NPO status , Patient's Chart, lab work & pertinent test results  Airway Mallampati: III  TM Distance: >3 FB Neck ROM: Full    Dental no notable dental hx. (+) Teeth Intact, Dental Advisory Given   Pulmonary neg pulmonary ROS,    Pulmonary exam normal breath sounds clear to auscultation       Cardiovascular negative cardio ROS Normal cardiovascular exam Rhythm:Regular Rate:Normal     Neuro/Psych negative neurological ROS  negative psych ROS   GI/Hepatic Neg liver ROS, GERD  Medicated and Controlled,  Endo/Other  diabetes, Well Controlled, Type 2Hypothyroidism Morbid obesityBMI 71  Renal/GU negative Renal ROS  Female GU complaint     Musculoskeletal negative musculoskeletal ROS (+)   Abdominal (+) + obese,   Peds  Hematology negative hematology ROS (+)   Anesthesia Other Findings   Reproductive/Obstetrics negative OB ROS                            Anesthesia Physical Anesthesia Plan  ASA: 3  Anesthesia Plan: MAC   Post-op Pain Management:    Induction:   PONV Risk Score and Plan: 2 and Propofol infusion and TIVA  Airway Management Planned: Natural Airway and Simple Face Mask  Additional Equipment: None  Intra-op Plan:   Post-operative Plan:   Informed Consent: I have reviewed the patients History and Physical, chart, labs and discussed the procedure including the risks, benefits and alternatives for the proposed anesthesia with the patient or authorized representative who has indicated his/her understanding and acceptance.       Plan Discussed with: CRNA  Anesthesia Plan Comments:         Anesthesia Quick Evaluation

## 2021-05-12 LAB — SURGICAL PATHOLOGY

## 2021-05-13 ENCOUNTER — Encounter (HOSPITAL_COMMUNITY): Payer: Self-pay | Admitting: Surgery

## 2021-05-21 DIAGNOSIS — F329 Major depressive disorder, single episode, unspecified: Secondary | ICD-10-CM | POA: Diagnosis not present

## 2021-05-22 ENCOUNTER — Ambulatory Visit: Payer: BC Managed Care – PPO | Admitting: Internal Medicine

## 2021-05-28 DIAGNOSIS — F329 Major depressive disorder, single episode, unspecified: Secondary | ICD-10-CM | POA: Diagnosis not present

## 2021-05-29 DIAGNOSIS — D259 Leiomyoma of uterus, unspecified: Secondary | ICD-10-CM | POA: Diagnosis not present

## 2021-06-05 DIAGNOSIS — F329 Major depressive disorder, single episode, unspecified: Secondary | ICD-10-CM | POA: Diagnosis not present

## 2021-06-10 ENCOUNTER — Ambulatory Visit: Payer: BC Managed Care – PPO | Admitting: Skilled Nursing Facility1

## 2021-06-11 DIAGNOSIS — F329 Major depressive disorder, single episode, unspecified: Secondary | ICD-10-CM | POA: Diagnosis not present

## 2021-06-19 DIAGNOSIS — F329 Major depressive disorder, single episode, unspecified: Secondary | ICD-10-CM | POA: Diagnosis not present

## 2021-06-24 NOTE — Progress Notes (Signed)
Sent message, via epic in basket, requesting orders in epic from surgeon.  

## 2021-06-25 ENCOUNTER — Ambulatory Visit: Payer: Self-pay | Admitting: Surgery

## 2021-06-25 DIAGNOSIS — F329 Major depressive disorder, single episode, unspecified: Secondary | ICD-10-CM | POA: Diagnosis not present

## 2021-06-30 ENCOUNTER — Other Ambulatory Visit: Payer: Self-pay

## 2021-06-30 ENCOUNTER — Encounter: Payer: BC Managed Care – PPO | Attending: Surgery | Admitting: Skilled Nursing Facility1

## 2021-06-30 DIAGNOSIS — Z6841 Body Mass Index (BMI) 40.0 and over, adult: Secondary | ICD-10-CM | POA: Diagnosis not present

## 2021-07-01 ENCOUNTER — Other Ambulatory Visit (HOSPITAL_COMMUNITY): Payer: Self-pay

## 2021-07-01 DIAGNOSIS — F329 Major depressive disorder, single episode, unspecified: Secondary | ICD-10-CM | POA: Diagnosis not present

## 2021-07-01 NOTE — Progress Notes (Signed)
Pre-Operative Nutrition Class:    Patient was seen on 06/30/2021 for Pre-Operative Bariatric Surgery Education at [facility]    Surgery date: 07/15/2021 Surgery type: sleeve Start weight at [facility]: 331.7 pounds Weight today: 337 pounds   The following the learning objectives were met by the patient during this course: Identify Pre-Op Dietary Goals and will begin 2 weeks pre-operatively Identify appropriate sources of fluids and proteins  State protein recommendations and appropriate sources pre and post-operatively Identify Post-Operative Dietary Goals and will follow for 2 weeks post-operatively Identify appropriate multivitamin and calcium sources Describe the need for physical activity post-operatively and will follow MD recommendations State when to call healthcare provider regarding medication questions or post-operative complications When having a diagnosis of diabetes understanding hypoglycemia symptoms and the inclusion of 1 complex carbohydrate per meal  Handouts given during class include: Pre-Op Bariatric Surgery Diet Handout Protein Shake Handout Post-Op Bariatric Surgery Nutrition Handout BELT Program Information Flyer Support Group Information Flyer [facility] Bariatric Supplements Price List  Follow-Up Plan: Patient will follow-up at [facility] 2 weeks post operatively for diet advancement per MD.

## 2021-07-02 ENCOUNTER — Ambulatory Visit (INDEPENDENT_AMBULATORY_CARE_PROVIDER_SITE_OTHER): Payer: BC Managed Care – PPO | Admitting: Internal Medicine

## 2021-07-02 ENCOUNTER — Other Ambulatory Visit: Payer: Self-pay

## 2021-07-02 ENCOUNTER — Encounter: Payer: Self-pay | Admitting: Internal Medicine

## 2021-07-02 VITALS — BP 130/76 | HR 89 | Ht 62.5 in | Wt 335.2 lb

## 2021-07-02 DIAGNOSIS — E039 Hypothyroidism, unspecified: Secondary | ICD-10-CM | POA: Diagnosis not present

## 2021-07-02 DIAGNOSIS — E1165 Type 2 diabetes mellitus with hyperglycemia: Secondary | ICD-10-CM | POA: Diagnosis not present

## 2021-07-02 LAB — POCT GLYCOSYLATED HEMOGLOBIN (HGB A1C): Hemoglobin A1C: 6.2 % — AB (ref 4.0–5.6)

## 2021-07-02 NOTE — Patient Instructions (Addendum)
Please have the thyroid tests checked on Friday and sent to me.  Please continue Levothyroxine 125 mcg daily.  Take the thyroid hormone EVERY DAY, with water, at least 30 minutes before breakfast, separated by at least 4 hours from: - acid reflux medications - calcium - iron - multivitamins  Please come back for a follow-up appointment in 3-4 months.

## 2021-07-02 NOTE — Progress Notes (Signed)
Patient ID: Christine Nelson, female   DOB: 06-02-1991, 30 y.o.   MRN: [id number]   This visit occurred during the SARS-CoV-2 public health emergency.  Safety protocols were in place, including screening questions prior to the visit, additional usage of staff PPE, and extensive cleaning of exam room while observing appropriate contact time as indicated for disinfecting solutions.   HPI  Christine Nelson is a 30 y.o. female, initially referred by her ObGyn , Dr. Anderson Malta A. Mock, returning for follow-up for hypothyroidism and type 2 diabetes, controlled, non-insulin-dependent, without long-term complications.  Last appointment 7 months ago.  Interim history: No increased urination, blurry vision, nausea, chest pain. She continues to see bariatrics and plans to have gastric sleeve surgery - 07/15/2021. On Lupron for last year to shrink her fibroids - surgery pending for 08/2021.  Hypothyroidism  - dx'ed in 2013 -She was on levothyroxine 150 mcg in the past but stopped in 2018 >> restarted in 2020 at the lower dose  She is on levothyroxine 125 mcg daily: - in am - fasting - at least 30 min from b'fast - no calcium - no iron - + multivitamins- 4h later - + PPIs  - 4h later - not on Biotin  Reviewed her TFTs: Lab Results  Component Value Date   TSH 11.64 (H) 05/01/2021   TSH 5.46 (H) 08/16/2020   TSH 3.03 04/11/2020   TSH 13.68 (H) 03/15/2019   TSH 6.960 (H) 02/04/2018   Lab Results  Component Value Date   FREET4 1.2 08/16/2020   FREET4 1.15 04/11/2020   FREET4 0.69 03/15/2019   FREET4 1.09 02/04/2018   Lab Results  Component Value Date   T3FREE 2.7 02/04/2018  05/03/2015: TSH 0.743, free T4 1.68  She continues to describe occasional fatigue and cold intolerance.  Pt denies: - feeling nodules in neck - hoarseness - dysphagia - choking - SOB with lying down  She has + FH of thyroid disorders in: Father. No FH of thyroid cancer. No h/o radiation tx to head or  neck.  No herbal supplements. No Biotin use. No recent steroids use.   Type 2 diabetes: -Previously on Metformin  Reviewed HbA1c level: Lab Results  Component Value Date   HGBA1C 6.2 (A) 07/02/2021   HGBA1C 7.0 (H) 05/01/2021   HGBA1C 6.2 (A) 11/21/2020   HGBA1C 6.5 04/11/2020   HGBA1C 6.8 (H) 03/15/2019   HGBA1C 5.5 02/04/2018   No CKD: Lab Results  Component Value Date   BUN 9 05/01/2021   BUN 7 04/30/2020   Lab Results  Component Value Date   CREATININE 0.85 05/01/2021   CREATININE 0.75 04/30/2020   No HL: Lab Results  Component Value Date   CHOL 153 05/01/2021   HDL 41.10 05/01/2021   LDLCALC 99 05/01/2021   TRIG 62.0 05/01/2021   CHOLHDL 4 05/01/2021   She sees OB/GYN for PCOS, uterine fibroid (now on Lupron).  Previously on OCPs but wanted to come off OCPs as she feels that they are hindering her weight loss. Sees Dr Mearl Latin.    ROS: Constitutional: no weight loss, no fatigue, no subjective hyperthermia, no subjective hypothermia Eyes: no blurry vision, no xerophthalmia ENT: no sore throat, + see HPI Cardiovascular: no CP/no SOB/no palpitations/no leg swelling Respiratory: no cough/no SOB/no wheezing Gastrointestinal: no N/no V/no D/no C/no acid reflux Musculoskeletal: no muscle aches/no joint aches Skin: no rashes, no hair loss Neurological: no tremors/no numbness/no tingling/no dizziness  I reviewed pt's medications, allergies, PMH, social hx, family  hx, and changes were documented in the history of present illness. Otherwise, unchanged from my initial visit note.  Past Medical History:  Diagnosis Date   Diabetes mellitus, type II (HCC)    Hgb A1c in 03/2019 was 6.8%   Fibroids    History of prediabetes    Hypothyroidism    Obesity    PCOS (polycystic ovarian syndrome)    Uterine leiomyoma    Vitamin D deficiency    Past Surgical History:  Procedure Laterality Date   BIOPSY  05/09/2021   Procedure: BIOPSY;  Surgeon: Felicie Morn, MD;   Location: [facility];  Service: General;;   ESOPHAGOGASTRODUODENOSCOPY N/A 05/09/2021   Procedure: ESOPHAGOGASTRODUODENOSCOPY (EGD) with biopsies;  Surgeon: Felicie Morn, MD;  Location: [facility];  Service: General;  Laterality: N/A;   Social History   Socioeconomic History   Marital status: Married    Spouse name: Manufacturing engineer   Number of children: Not on file   Years of education: Not on file   Highest education level: Not on file  Occupational History   Occupation: student   Occupation: Administrative Asst for [employer]  Tobacco Use   Smoking status: Never   Smokeless tobacco: Never  Vaping Use   Vaping Use: Never used  Substance and Sexual Activity   Alcohol use: Never   Drug use: Never   Sexual activity: Yes  Other Topics Concern   Not on file  Social History Narrative   Pt is single.  She has a boyfriend.  Pt graduated college with a degree in Biology.  Pt has always wanted to become a physician.  Pt was teaching Biology.  She is now working part-time at [employer].         Social Determinants of Health   Financial Resource Strain: Not on file  Food Insecurity: Not on file  Transportation Needs: Not on file  Physical Activity: Not on file  Stress: Not on file  Social Connections: Not on file  Intimate Partner Violence: Not on file   Current Outpatient Medications on File Prior to Visit  Medication Sig Dispense Refill   albuterol (VENTOLIN HFA) 108 (90 Base) MCG/ACT inhaler Inhale 2 puffs into the lungs every 6 (six) hours as needed for shortness of breath or wheezing.     ELDERBERRY PO Take 1 capsule by mouth 2 (two) times a week.     lansoprazole (PREVACID) 30 MG capsule Take 1 capsule (30 mg total) by mouth daily at 12 noon. (Patient taking differently: Take 30 mg by mouth daily as needed (acid reflux).) 90 capsule 1   levocetirizine (XYZAL) 5 MG tablet Take 5 mg by mouth at bedtime.     levothyroxine (SYNTHROID) 125 MCG tablet Take 125 mcg by  mouth daily before breakfast.     LUPRON DEPOT, 80-MONTH, 3.75 MG injection Inject 3.75 mg into the muscle every 28 (twenty-eight) days.     Multiple Vitamins-Minerals (WOMENS MULTIVITAMIN PO) Take 1 tablet by mouth daily.     Prenatal Vit-Fe Fumarate-FA (PRENATAL MULTIVITAMIN) TABS tablet Take 1 tablet by mouth daily at 12 noon.     Vitamin D, Ergocalciferol, (DRISDOL) 1.25 MG (50000 UNIT) CAPS capsule Take 1 capsule (50,000 Units total) by mouth every 7 (seven) days. 12 capsule 0   No current facility-administered medications on file prior to visit.   No Known Allergies Family History  Problem Relation Age of Onset   Hypertension Mother    Thyroid disease Father    Hypertension Maternal  Grandfather    Stroke Maternal Grandfather   Pertinent family history: See HPI  PE: BP 130/76 (BP Location: Right Arm, Patient Position: Sitting, Cuff Size: Large)   Pulse 89   Ht 5' 2.5" (1.588 m)   Wt (!) 335 lb 3.2 oz (152 kg)   SpO2 98%   BMI 60.33 kg/m  Wt Readings from Last 3 Encounters:  07/02/21 (!) 335 lb 3.2 oz (152 kg)  07/01/21 (!) 337 lb (152.9 kg)  05/09/21 (!) 330 lb (149.7 kg)   Constitutional: overweight, in NAD Eyes: PERRLA, EOMI, no exophthalmos ENT: moist mucous membranes, no thyromegaly, no cervical lymphadenopathy Cardiovascular: Tachycardia, RR, No MRG Respiratory: CTA B Gastrointestinal: abdomen soft, NT, ND, BS+ Musculoskeletal: no deformities, strength intact in all 4 Skin: moist, warm, no rashes Neurological: no tremor with outstretched hands, DTR normal in all 4  ASSESSMENT: 1. Hypothyroidism  2. Prediabetes  PLAN:  1. Patient with longstanding, uncontrolled, hypothyroidism, due to noncompliance with levothyroxine.  She was missing doses in the past.  I advised her to try to take it every morning and put it on her nightstand. -TPO and ATA antibodies were not elevated in the past - latest thyroid labs reviewed with pt. >> TSH was elevated at last check 2  months ago: Lab Results  Component Value Date   TSH 11.64 (H) 05/01/2021  - she continues on LT4 125 mcg daily.  She mentions that she missed doses before this TSH level was checked.  I did advise her that if she misses 1 day she can take 2 tablets the next day. - pt feels good on this dose -We discussed that the dose may need to change after her weight loss surgery so we will need to follow her thyroid status closely. -I also advised her that she cannot be off her levothyroxine around the time of her surgery.  She tells me that she was advised not to take any of her medications but I advised her that levothyroxine is an exception. - we discussed about taking the thyroid hormone every day, with water, >30 minutes before breakfast, separated by >4 hours from acid reflux medications, calcium, iron, multivitamins. Pt. is taking it correctly, but missed 3 doses in last 1 mo. -I will see her back in 3 to 4 months, after her gastric bypass surgery.  2.  Type 2 diabetes -Mild -At last visit, HbA1c was better, at 6.2%. -At last visit we discussed about the importance of healthy diet and weight loss.  She is planning to have gastric sleeve surgery and we discussed that diabetes would likely improve afterwards.  This is coming up with her in less than 2 weeks.  She is now on a high-protein diet. -She is not on medication for her diabetes. -At today's visit HbA1c is 6.2%, stable -For now, we will continue to keep an eye on this and follow her without medication and recheck HbA1c at next visit  Needs refills.  Philemon Kingdom, MD PhD [facility] Endocrinology

## 2021-07-04 ENCOUNTER — Encounter (HOSPITAL_COMMUNITY): Admission: RE | Admit: 2021-07-04 | Payer: BC Managed Care – PPO | Source: Ambulatory Visit

## 2021-07-07 NOTE — Patient Instructions (Signed)
DUE TO COVID-19 ONLY ONE VISITOR IS ALLOWED TO COME WITH YOU AND STAY IN THE WAITING ROOM ONLY DURING PRE OP AND PROCEDURE DAY OF SURGERY IF YOU ARE GOING HOME AFTER SURGERY. IF YOU ARE SPENDING THE NIGHT 2 PEOPLE MAY VISIT WITH YOU IN YOUR PRIVATE ROOM AFTER SURGERY UNTIL VISITING  HOURS ARE OVER AT 8:00 PM AND 1 VISITOR CAN SPEND THE NIGHT.   YOU NEED TO HAVE A COVID 19 TEST ON_10/7__THIS TEST MUST BE DONE BEFORE SURGERY,  COVID TESTING SITE  IS LOCATED AT [facility], [place]. REMAIN IN YOUR CAR THIS IS A DRIVE UP TEST. AFTER YOUR COVID TEST PLEASE WEAR A MASK OUT IN PUBLIC AND SOCIAL DISTANCE AND [place] YOUR HANDS FREQUENTLY, ALSO ASK ALL YOUR CLOSE CONTACT PERSONS TO WEAR A MASK AND SOCIAL DISTANCE AND [place] THEIR HANDS FREQUENTLY ALSO.               Christine Nelson     Your procedure is scheduled on: 07/15/21   Report to [facility] Main  Entrance   Report to admitting at  7:00 AM     Call this number if you have problems the morning of surgery [phone]   MORNING OF SURGERY DRINK:   DRINK 1 G2 drink BEFORE YOU LEAVE HOME, DRINK ALL OF THE  G2 DRINK AT ONE TIME.     NO SOLID FOOD AFTER 6:00 PM THE NIGHT BEFORE YOUR SURGERY.   YOU MAY DRINK CLEAR FLUIDS. Until 6:30 am   PAIN IS EXPECTED AFTER SURGERY AND WILL NOT BE COMPLETELY ELIMINATED.   AMBULATION AND TYLENOL WILL HELP REDUCE INCISIONAL AND GAS PAIN. MOVEMENT IS KEY!  YOU ARE EXPECTED TO BE OUT OF BED WITHIN 4 HOURS OF ADMISSION TO YOUR PATIENT ROOM.  SITTING IN THE RECLINER THROUGHOUT THE DAY IS IMPORTANT FOR DRINKING FLUIDS AND MOVING GAS THROUGHOUT THE GI TRACT.  COMPRESSION STOCKINGS SHOULD BE WORN Christine Nelson UNLESS YOU ARE WALKING.   INCENTIVE SPIROMETER SHOULD BE USED EVERY HOUR WHILE AWAKE TO DECREASE POST-OPERATIVE COMPLICATIONS SUCH AS PNEUMONIA.  WHEN DISCHARGED HOME, IT IS IMPORTANT TO CONTINUE TO WALK EVERY HOUR AND USE THE INCENTIVE SPIROMETER EVERY HOUR.             CLEAR LIQUID DIET   Foods Allowed                                                                     Foods Excluded                                                                                             liquids that you cannot  Plain Jell-O any favor except red or purple  see through such as: Fruit ices (not with fruit pulp)                                     milk, soups, orange juice  Iced Popsicles                                    All solid food Carbonated beverages, regular and diet                                    Cranberry, grape and apple juices Sports drinks like Gatorade Lightly seasoned clear broth or consume(fat free) Sugar    BRUSH YOUR TEETH MORNING OF SURGERY AND RINSE YOUR MOUTH OUT, NO CHEWING GUM CANDY OR MINTS.     Take these medicines the morning of surgery with A SIP OF WATER: Levothyroxine. Use your inhaler and bring it with you                                You may not have any metal on your body including hair pins and              piercings  Do not wear jewelry, make-up, lotions, powders or perfumes, deodorant             Do not wear nail polish on your fingernails.  Do not shave  48 hours prior to surgery.                Do not bring valuables to the hospital. [place].  Contacts, dentures or bridgework may not be worn into surgery.  Leave suitcase in the car. After surgery it may be brought to your room.       Name and phone number of your driver:  Special Instructions: N/A              Please read over the following fact sheets you were given: _____________________________________________________________________             [facility] - Preparing for Surgery Before surgery, you can play an important role.  Because skin is not sterile, your skin needs to be as free of germs as possible.  You can reduce the number of germs on your  skin by washing with CHG (chlorahexidine gluconate) soap before surgery.  CHG is an antiseptic cleaner which kills germs and bonds with the skin to continue killing germs even after washing. Please DO NOT use if you have an allergy to CHG or antibacterial soaps.  If your skin becomes reddened/irritated stop using the CHG and inform your nurse when you arrive at Short Stay. Do not shave (including legs and underarms) for at least 48 hours prior to the first CHG shower.  Please follow these instructions carefully:  1.  Shower with CHG Soap the night before surgery and the  morning of Surgery.  2.  If you choose to wash your hair, wash your hair first as usual with your  normal  shampoo.  3.  After you shampoo, rinse your hair and body thoroughly to remove the  shampoo.                           4.  Use CHG as you would any other liquid soap.  You can apply chg directly  to the skin and wash                        Gently with a scrungie or clean washcloth.  5.  Apply the CHG Soap to your body ONLY FROM THE NECK DOWN.   Do not use on face/ open                           Wound or open sores. Avoid contact with eyes, ears mouth and genitals (private parts).                       Wash face,  Genitals (private parts) with your normal soap.             6.  Wash thoroughly, paying special attention to the area where your surgery  will be performed.  7.  Thoroughly rinse your body with warm water from the neck down.  8.  DO NOT shower/wash with your normal soap after using and rinsing off  the CHG Soap.                9.  Pat yourself dry with a clean towel.            10.  Wear clean pajamas.            11.  Place clean sheets on your bed the night of your first shower and do not  sleep with pets. Day of Surgery : Do not apply any lotions/deodorants the morning of surgery.  Please wear clean clothes to the hospital/surgery center.  FAILURE TO FOLLOW THESE INSTRUCTIONS MAY RESULT IN THE CANCELLATION OF  YOUR SURGERY PATIENT SIGNATURE_________________________________  NURSE SIGNATURE__________________________________  ________________________________________________________________________   Christine Nelson  An incentive spirometer is a tool that can help keep your lungs clear and active. This tool measures how well you are filling your lungs with each breath. Taking long deep breaths may help reverse or decrease the chance of developing breathing (pulmonary) problems (especially infection) following: A long period of time when you are unable to move or be active. BEFORE THE PROCEDURE  If the spirometer includes an indicator to show your best effort, your nurse or respiratory therapist will set it to a desired goal. If possible, sit up straight or lean slightly forward. Try not to slouch. Hold the incentive spirometer in an upright position. INSTRUCTIONS FOR USE  Sit on the edge of your bed if possible, or sit up as far as you can in bed or on a chair. Hold the incentive spirometer in an upright position. Breathe out normally. Place the mouthpiece in your mouth and seal your lips tightly around it. Breathe in slowly and as deeply as possible, raising the piston or the ball toward the top of the column. Hold your breath for 3-5 seconds or for as long as possible. Allow the piston or ball to fall to the bottom of the column. Remove the mouthpiece from your mouth and breathe out normally. Rest for a few seconds and repeat Steps 1 through 7 at least 10 times every 1-2 hours when you are awake. Take your time and take  a few normal breaths between deep breaths. The spirometer may include an indicator to show your best effort. Use the indicator as a goal to work toward during each repetition. After each set of 10 deep breaths, practice coughing to be sure your lungs are clear. If you have an incision (the cut made at the time of surgery), support your incision when coughing by placing a pillow  or rolled up towels firmly against it. Once you are able to get out of bed, walk around indoors and cough well. You may stop using the incentive spirometer when instructed by your caregiver.  RISKS AND COMPLICATIONS Take your time so you do not get dizzy or light-headed. If you are in pain, you may need to take or ask for pain medication before doing incentive spirometry. It is harder to take a deep breath if you are having pain. AFTER USE Rest and breathe slowly and easily. It can be helpful to keep track of a log of your progress. Your caregiver can provide you with a simple table to help with this. If you are using the spirometer at home, follow these instructions: [place] IF:  You are having difficultly using the spirometer. You have trouble using the spirometer as often as instructed. Your pain medication is not giving enough relief while using the spirometer. You develop fever of 100.5 F (38.1 C) or higher. SEEK IMMEDIATE MEDICAL CARE IF:  You cough up bloody sputum that had not been present before. You develop fever of 102 F (38.9 C) or greater. You develop worsening pain at or near the incision site. MAKE SURE YOU:  Understand these instructions. Will watch your condition. Will get help right away if you are not doing well or get worse. Document Released: 02/01/2007 Document Revised: 12/14/2011 Document Reviewed: 04/04/2007 [facility] Patient Information 2014 [place], [place].   ________________________________________________________________________

## 2021-07-08 ENCOUNTER — Encounter (HOSPITAL_COMMUNITY)
Admission: RE | Admit: 2021-07-08 | Discharge: 2021-07-08 | Disposition: A | Discharge status: Home / Self Care | Payer: BC Managed Care – PPO | Source: Ambulatory Visit | Attending: Surgery | Admitting: Surgery

## 2021-07-08 ENCOUNTER — Encounter (HOSPITAL_COMMUNITY): Payer: Self-pay

## 2021-07-08 ENCOUNTER — Other Ambulatory Visit: Payer: Self-pay

## 2021-07-08 DIAGNOSIS — Z01812 Encounter for preprocedural laboratory examination: Secondary | ICD-10-CM | POA: Insufficient documentation

## 2021-07-08 DIAGNOSIS — E119 Type 2 diabetes mellitus without complications: Secondary | ICD-10-CM | POA: Diagnosis not present

## 2021-07-08 DIAGNOSIS — K219 Gastro-esophageal reflux disease without esophagitis: Secondary | ICD-10-CM | POA: Diagnosis not present

## 2021-07-08 DIAGNOSIS — E039 Hypothyroidism, unspecified: Secondary | ICD-10-CM | POA: Diagnosis not present

## 2021-07-08 DIAGNOSIS — J45909 Unspecified asthma, uncomplicated: Secondary | ICD-10-CM | POA: Insufficient documentation

## 2021-07-08 DIAGNOSIS — Z6841 Body Mass Index (BMI) 40.0 and over, adult: Secondary | ICD-10-CM | POA: Insufficient documentation

## 2021-07-08 DIAGNOSIS — Z7951 Long term (current) use of inhaled steroids: Secondary | ICD-10-CM | POA: Diagnosis not present

## 2021-07-08 HISTORY — DX: Unspecified asthma, uncomplicated: J45.909

## 2021-07-08 HISTORY — DX: Gastro-esophageal reflux disease without esophagitis: K21.9

## 2021-07-08 LAB — CBC WITH DIFFERENTIAL/PLATELET
Abs Immature Granulocytes: 0.02 10*3/uL (ref 0.00–0.07)
Basophils Absolute: 0 10*3/uL (ref 0.0–0.1)
Basophils Relative: 0 %
Eosinophils Absolute: 0.1 10*3/uL (ref 0.0–0.5)
Eosinophils Relative: 2 %
HCT: 39.8 % (ref 36.0–46.0)
Hemoglobin: 12.3 g/dL (ref 12.0–15.0)
Immature Granulocytes: 0 %
Lymphocytes Relative: 34 %
Lymphs Abs: 2.4 10*3/uL (ref 0.7–4.0)
MCH: 26.1 pg (ref 26.0–34.0)
MCHC: 30.9 g/dL (ref 30.0–36.0)
MCV: 84.3 fL (ref 80.0–100.0)
Monocytes Absolute: 0.3 10*3/uL (ref 0.1–1.0)
Monocytes Relative: 4 %
Neutro Abs: 4.1 10*3/uL (ref 1.7–7.7)
Neutrophils Relative %: 60 %
Platelets: 355 10*3/uL (ref 150–400)
RBC: 4.72 MIL/uL (ref 3.87–5.11)
RDW: 15.6 % — ABNORMAL HIGH (ref 11.5–15.5)
WBC: 6.9 10*3/uL (ref 4.0–10.5)
nRBC: 0 % (ref 0.0–0.2)

## 2021-07-08 LAB — COMPREHENSIVE METABOLIC PANEL
ALT: 42 U/L (ref 0–44)
AST: 40 U/L (ref 15–41)
Albumin: 3.9 g/dL (ref 3.5–5.0)
Alkaline Phosphatase: 84 U/L (ref 38–126)
Anion gap: 11 (ref 5–15)
BUN: 14 mg/dL (ref 6–20)
CO2: 27 mmol/L (ref 22–32)
Calcium: 10.4 mg/dL — ABNORMAL HIGH (ref 8.9–10.3)
Chloride: 108 mmol/L (ref 98–111)
Creatinine, Ser: 0.95 mg/dL (ref 0.44–1.00)
GFR, Estimated: >60 mL/min (ref >60)
Glucose, Bld: 95 mg/dL (ref 70–99)
Potassium: 4.9 mmol/L (ref 3.5–5.1)
Sodium: 146 mmol/L — ABNORMAL HIGH (ref 135–145)
Total Bilirubin: 0.7 mg/dL (ref 0.3–1.2)
Total Protein: 7.8 g/dL (ref 6.5–8.1)

## 2021-07-08 LAB — GLUCOSE, CAPILLARY: Glucose-Capillary: 92 mg/dL (ref 70–99)

## 2021-07-08 NOTE — Progress Notes (Signed)
COVID test- 07/11/21  PCP - Dr. Lockie Pares LOV 05/01/21 Cardiologist - no  Chest x-ray - 04/10/21-epic EKG - 04/10/21-epic Stress Test - no ECHO - no Cardiac Cath - NA Pacemaker/ICD device last checked:NA  Sleep Study - no CPAP -   Fasting Blood Sugar - doesn't test Checks Blood Sugar _____ times a day  Blood Thinner Instructions:NA Aspirin Instructions: Last Dose:  Anesthesia review: yes  Patient denies shortness of breath, fever, cough and chest pain at PAT appointment Pt can climb 2 flights of stairs. She sometimes needs an inhaler during allergy season for wheezing.  Patient verbalized understanding of instructions that were given to them at the PAT appointment. Patient was also instructed that they will need to review over the PAT instructions again at home before surgery. Yes

## 2021-07-09 ENCOUNTER — Telehealth: Payer: Self-pay

## 2021-07-09 DIAGNOSIS — F329 Major depressive disorder, single episode, unspecified: Secondary | ICD-10-CM | POA: Diagnosis not present

## 2021-07-09 NOTE — Telephone Encounter (Signed)
Patient has physical scheduled on 10/7 and wants to know if she needs to fast pt stated she had labs drawn on 10/4 for her upcoming bariatric surgery. Patient would like a call back.

## 2021-07-09 NOTE — Progress Notes (Signed)
Anesthesia Chart Review   Case: [id number] Date/Time: 07/15/21 0930   Procedures:      ROBOTIC SLEEVE GASTRECTOMY     UPPER GI ENDOSCOPY   Anesthesia type: General   Pre-op diagnosis: morbid obesity   [facility] ROOM 02 / [facility]   Surgeons: Stechschulte, Nickola Major, MD       DISCUSSION:30 y.o. never smoker with h/o non insulin dependent DM II (A1C 6.2), GERD, asthma, hypothyroidism, morbid obesity scheduled for above procedure 07/15/21 with Dr. Louanna Raw.   Last seen by PCP 07/02/2021, stable at this visit.  PCP aware of upcoming procedure.  Pt has had poor compliance with levothyroxine.  Advised at this visit to take as prescribed around upcoming surgery and she will follow up after procedure.   Anticipate pt can proceed with planned procedure barring acute status change.   VS: BP 126/80   Pulse 80   Temp 36.7 C   Resp 20   Ht 5' 2.5" (1.588 m)   Wt (!) 149.7 kg   SpO2 99%   BMI 59.40 kg/m   PROVIDERS: Billie Ruddy, MD is PCP     LABS: Labs reviewed: Acceptable for surgery. (all labs ordered are listed, but only abnormal results are displayed)  Labs Reviewed  CBC WITH DIFFERENTIAL/PLATELET - Abnormal; Notable for the following components:      Result Value   RDW 15.6 (*)    All other components within normal limits  COMPREHENSIVE METABOLIC PANEL - Abnormal; Notable for the following components:   Sodium 146 (*)    Calcium 10.4 (*)    All other components within normal limits  GLUCOSE, CAPILLARY  TYPE AND SCREEN     IMAGES:   EKG: 04/10/2021 Rate 84 bpm  Normal sinus rhythm Minimal voltage criteria for LVH, may be normal variant ( R in aVL ) Nonspecific ST abnormality Abnormal ECG No previous tracing  CV:  Past Medical History:  Diagnosis Date   Asthma    with allergy season   Diabetes mellitus, type II (HCC)    Hgb A1c in 03/2019 was 6.8%   Fibroids    GERD (gastroesophageal reflux disease)    History of prediabetes    Hypothyroidism     Obesity    PCOS (polycystic ovarian syndrome)    Uterine leiomyoma    Vitamin D deficiency     Past Surgical History:  Procedure Laterality Date   BIOPSY  05/09/2021   Procedure: BIOPSY;  Surgeon: Felicie Morn, MD;  Location: [facility];  Service: General;;   ESOPHAGOGASTRODUODENOSCOPY N/A 05/09/2021   Procedure: ESOPHAGOGASTRODUODENOSCOPY (EGD) with biopsies;  Surgeon: Felicie Morn, MD;  Location: [facility];  Service: General;  Laterality: N/A;   WISDOM TOOTH EXTRACTION  2011    MEDICATIONS:  albuterol (VENTOLIN HFA) 108 (90 Base) MCG/ACT inhaler   ELDERBERRY PO   lansoprazole (PREVACID) 30 MG capsule   levocetirizine (XYZAL) 5 MG tablet   levothyroxine (SYNTHROID) 125 MCG tablet   LUPRON DEPOT, 98-MONTH, 3.75 MG injection   Multiple Vitamins-Minerals (WOMENS MULTIVITAMIN PO)   Prenatal Vit-Fe Fumarate-FA (PRENATAL MULTIVITAMIN) TABS tablet   Vitamin D, Ergocalciferol, (DRISDOL) 1.25 MG (50000 UNIT) CAPS capsule   No current facility-administered medications for this encounter.     [name], PA-C [facility] Pre-Surgical Testing [phone]

## 2021-07-10 ENCOUNTER — Other Ambulatory Visit: Payer: Self-pay

## 2021-07-10 NOTE — Telephone Encounter (Signed)
Spoke with patient, informed her provider states she does not need to fast.

## 2021-07-11 ENCOUNTER — Other Ambulatory Visit: Payer: Self-pay | Admitting: Surgery

## 2021-07-11 ENCOUNTER — Encounter: Payer: Self-pay | Admitting: Family Medicine

## 2021-07-11 ENCOUNTER — Ambulatory Visit (INDEPENDENT_AMBULATORY_CARE_PROVIDER_SITE_OTHER): Payer: BC Managed Care – PPO | Admitting: Family Medicine

## 2021-07-11 VITALS — BP 110/80 | HR 96 | Temp 97.5°F | Ht 63.0 in | Wt 328.5 lb

## 2021-07-11 DIAGNOSIS — R7303 Prediabetes: Secondary | ICD-10-CM

## 2021-07-11 DIAGNOSIS — R0982 Postnasal drip: Secondary | ICD-10-CM

## 2021-07-11 DIAGNOSIS — J302 Other seasonal allergic rhinitis: Secondary | ICD-10-CM | POA: Diagnosis not present

## 2021-07-11 DIAGNOSIS — Z0000 Encounter for general adult medical examination without abnormal findings: Secondary | ICD-10-CM

## 2021-07-11 DIAGNOSIS — E039 Hypothyroidism, unspecified: Secondary | ICD-10-CM

## 2021-07-11 LAB — SARS CORONAVIRUS 2 (TAT 6-24 HRS): SARS Coronavirus 2: NEGATIVE

## 2021-07-11 NOTE — Progress Notes (Signed)
Subjective:     Christine Nelson is a married 30 y.o. female  here for a comprehensive physical exam. Patient scheduled to have gastric sleeve next week.  Had preop labs.  Endorses increased allergy symptoms as she was advised to stop medications prior to her surgery.  Patient currently with nasal congestion and drainage occasionally causing cough.  Was taking Xyzal.  Denies fever, chills, nausea, vomiting, ear pain/pressure.  Pt's virtual graduation for her masters is tomorrow and the in person ceremony is in May of next year.  Patient still considering applying to med school.  States will make the final decision in January.  Social History   Socioeconomic History   Marital status: Married    Spouse name: Manufacturing engineer   Number of children: Not on file   Years of education: Not on file   Highest education level: Not on file  Occupational History   Occupation: student   Occupation: Administrative Asst for [employer]  Tobacco Use   Smoking status: Never   Smokeless tobacco: Never  Vaping Use   Vaping Use: Never used  Substance and Sexual Activity   Alcohol use: Never   Drug use: Never   Sexual activity: Yes  Other Topics Concern   Not on file  Social History Narrative   Pt is single.  She has a boyfriend.  Pt graduated college with a degree in Biology.  Pt has always wanted to become a physician.  Pt was teaching Biology.  She is now working part-time at [employer].         Social Determinants of Health   Financial Resource Strain: Not on file  Food Insecurity: Not on file  Transportation Needs: Not on file  Physical Activity: Not on file  Stress: Not on file  Social Connections: Not on file  Intimate Partner Violence: Not on file   Health Maintenance  Topic Date Due   COVID-19 Vaccine (1) Never done   URINE MICROALBUMIN  Never done   HIV Screening  Never done   Hepatitis C Screening  Never done   PAP-Cervical Cytology Screening  Never done   TETANUS/TDAP   11/05/2018   PAP SMEAR-Modifier  02/01/2021   INFLUENZA VACCINE  05/05/2021   HPV VACCINES  Completed    The following portions of the patient's history were reviewed and updated as appropriate: allergies, current medications, past family history, past medical history, past social history, past surgical history, and problem list.  Review of Systems Pertinent items noted in HPI and remainder of comprehensive ROS otherwise negative.   Objective:    BP 110/80 (BP Location: Left Arm, Patient Position: Sitting, Cuff Size: Normal)   Pulse 96   Temp (!) 97.5 F (36.4 C) (Oral)   Ht 5\' 3"  (1.6 m)   Wt (!) 328 lb 8 oz (149 kg)   SpO2 96%   BMI 58.19 kg/m  General appearance: alert, cooperative, and no distress Head: Normocephalic, without obvious abnormality, atraumatic Eyes: conjunctivae/corneas clear. PERRL, EOM's intact. Fundi benign. Ears: normal TM's and external ear canals both ears Nose:  Nares patent, septum midline, clear mucus present. Throat: lips, mucosa, and tongue normal; teeth and gums normal and postnasal drainage noted Neck: no adenopathy, no carotid bruit, no JVD, supple, symmetrical, trachea midline, and thyroid not enlarged, symmetric, no tenderness/mass/nodules Lungs: clear to auscultation bilaterally and transmitted upper airway noises. Heart: regular rate and rhythm, S1, S2 normal, no murmur, click, rub or gallop Abdomen: soft, non-tender; bowel sounds normal; no  masses,  no organomegaly Extremities: extremities normal, atraumatic, no cyanosis or edema Pulses: 2+ and symmetric Skin: Skin color, texture, turgor normal. No rashes or lesions Lymph nodes: Cervical, supraclavicular, and axillary nodes normal. Neurologic: Alert and oriented X 3, normal strength and tone. Normal symmetric reflexes. Normal coordination and gait    Assessment:    Healthy female exam.      Plan:    Anticipatory guidance given including wearing seatbelts, smoke detectors in the home,  increasing physical activity, increasing p.o. intake of water and vegetables. -had recent pre-op labs -continue f/u with OB/Gyn -next CPE in 1 yr See After Visit Summary for Counseling Recommendations   Morbid obesity ([place]) -continue lifestyle modifications -robotic gastric sleeve scheduled 07/15/21 -advised on the improctance on taking vitamins daily after procedure  Acquired hypothyroidism -TSH elevated 05/01/2021 at 11.64 -Repeat TSH.  Order previously placed -Continue Synthroid 125 mcg daily -Continue follow-up with endocrinology  Seasonal allergies -Holding regular allergy medication 2/2 upcoming surgery -Can use nasal saline rinse and honey for sx  Post-nasal drainage -Supportive care including nasal saline rinse and honey for symptoms as holding regular allergy medications 2/2 upcoming surgery  Prediabetes -Continue lifestyle modifications  F/u in 2-3 months  Grier Mitts, MD

## 2021-07-14 ENCOUNTER — Telehealth: Payer: Self-pay | Admitting: Skilled Nursing Facility1

## 2021-07-14 DIAGNOSIS — F329 Major depressive disorder, single episode, unspecified: Secondary | ICD-10-CM | POA: Diagnosis not present

## 2021-07-14 NOTE — Telephone Encounter (Signed)
Returned pts call.   Answered pts questions to her satisfaction.

## 2021-07-15 ENCOUNTER — Encounter (HOSPITAL_COMMUNITY)
Admission: RE | Disposition: A | Discharge status: Home / Self Care | Payer: Self-pay | Source: Home / Self Care | Attending: Surgery

## 2021-07-15 ENCOUNTER — Inpatient Hospital Stay (HOSPITAL_COMMUNITY): Payer: BC Managed Care – PPO | Admitting: Physician Assistant

## 2021-07-15 ENCOUNTER — Inpatient Hospital Stay (HOSPITAL_COMMUNITY)
Admission: RE | Admit: 2021-07-15 | Discharge: 2021-07-16 | DRG: 621 | Disposition: A | Discharge status: Home / Self Care | Payer: BC Managed Care – PPO | Attending: Surgery | Admitting: Surgery

## 2021-07-15 ENCOUNTER — Other Ambulatory Visit: Payer: Self-pay

## 2021-07-15 ENCOUNTER — Encounter (HOSPITAL_COMMUNITY): Payer: Self-pay | Admitting: Surgery

## 2021-07-15 ENCOUNTER — Other Ambulatory Visit (HOSPITAL_COMMUNITY): Payer: Self-pay

## 2021-07-15 ENCOUNTER — Inpatient Hospital Stay (HOSPITAL_COMMUNITY): Payer: BC Managed Care – PPO | Admitting: Certified Registered Nurse Anesthetist

## 2021-07-15 DIAGNOSIS — K219 Gastro-esophageal reflux disease without esophagitis: Secondary | ICD-10-CM | POA: Diagnosis present

## 2021-07-15 DIAGNOSIS — E119 Type 2 diabetes mellitus without complications: Secondary | ICD-10-CM | POA: Diagnosis not present

## 2021-07-15 DIAGNOSIS — Z79899 Other long term (current) drug therapy: Secondary | ICD-10-CM

## 2021-07-15 DIAGNOSIS — E039 Hypothyroidism, unspecified: Secondary | ICD-10-CM | POA: Diagnosis not present

## 2021-07-15 DIAGNOSIS — J45909 Unspecified asthma, uncomplicated: Secondary | ICD-10-CM | POA: Diagnosis present

## 2021-07-15 DIAGNOSIS — D500 Iron deficiency anemia secondary to blood loss (chronic): Secondary | ICD-10-CM | POA: Diagnosis not present

## 2021-07-15 DIAGNOSIS — Z6841 Body Mass Index (BMI) 40.0 and over, adult: Secondary | ICD-10-CM | POA: Diagnosis not present

## 2021-07-15 DIAGNOSIS — Z79890 Hormone replacement therapy (postmenopausal): Secondary | ICD-10-CM | POA: Diagnosis not present

## 2021-07-15 HISTORY — PX: LAPAROSCOPIC GASTRIC SLEEVE RESECTION: SHX5895

## 2021-07-15 HISTORY — PX: UPPER GI ENDOSCOPY: SHX6162

## 2021-07-15 LAB — CBC
HCT: 39.3 % (ref 36.0–46.0)
Hemoglobin: 12.2 g/dL (ref 12.0–15.0)
MCH: 26.3 pg (ref 26.0–34.0)
MCHC: 31 g/dL (ref 30.0–36.0)
MCV: 84.7 fL (ref 80.0–100.0)
Platelets: 326 10*3/uL (ref 150–400)
RBC: 4.64 MIL/uL (ref 3.87–5.11)
RDW: 15.5 % (ref 11.5–15.5)
WBC: 10.6 10*3/uL — ABNORMAL HIGH (ref 4.0–10.5)
nRBC: 0 % (ref 0.0–0.2)

## 2021-07-15 LAB — PREGNANCY, URINE: Preg Test, Ur: NEGATIVE

## 2021-07-15 LAB — TYPE AND SCREEN
ABO/RH(D): B NEG
Antibody Screen: NEGATIVE

## 2021-07-15 LAB — CREATININE, SERUM
Creatinine, Ser: 0.92 mg/dL (ref 0.44–1.00)
GFR, Estimated: >60 mL/min (ref >60)

## 2021-07-15 LAB — GLUCOSE, CAPILLARY
Glucose-Capillary: 91 mg/dL (ref 70–99)
Glucose-Capillary: 174 mg/dL — ABNORMAL HIGH (ref 70–99)

## 2021-07-15 LAB — ABO/RH: ABO/RH(D): B NEG

## 2021-07-15 SURGERY — XI ROBOTIC GASTRIC SLEEVE RESECTION
Anesthesia: General | Site: Abdomen

## 2021-07-15 MED ORDER — ACETAMINOPHEN 160 MG/5ML PO SOLN: 1000.0000 mg | Freq: Three times a day (TID) | ORAL | Status: DC

## 2021-07-15 MED ORDER — STERILE WATER FOR IRRIGATION IR SOLN
Status: DC | PRN
  Administered 2021-07-15: 1000 mL

## 2021-07-15 MED ORDER — ENOXAPARIN SODIUM 40 MG/0.4ML IJ SOSY
40.0000 mg | PREFILLED_SYRINGE | INTRAMUSCULAR | Status: AC
  Administered 2021-07-15: 40 mg via SUBCUTANEOUS
  Filled 2021-07-15: qty 0.4

## 2021-07-15 MED ORDER — ENOXAPARIN SODIUM 30 MG/0.3ML IJ SOSY
30.0000 mg | PREFILLED_SYRINGE | Freq: Two times a day (BID) | INTRAMUSCULAR | Status: DC
  Administered 2021-07-15 – 2021-07-16 (×2): 30 mg via SUBCUTANEOUS
  Filled 2021-07-15 (×2): qty 0.3

## 2021-07-15 MED ORDER — BUPIVACAINE LIPOSOME 1.3 % IJ SUSP
INTRAMUSCULAR | Status: DC | PRN
  Administered 2021-07-15: 20 mL

## 2021-07-15 MED ORDER — OXYCODONE HCL 5 MG PO TABS
5.0000 mg | ORAL_TABLET | Freq: Four times a day (QID) | ORAL | 0 refills | Status: DC | PRN
  Filled 2021-07-15: qty 10, 3d supply, fill #0

## 2021-07-15 MED ORDER — BUPIVACAINE LIPOSOME 1.3 % IJ SUSP
INTRAMUSCULAR | Status: AC
  Filled 2021-07-15: qty 20

## 2021-07-15 MED ORDER — DEXAMETHASONE SODIUM PHOSPHATE 10 MG/ML IJ SOLN
INTRAMUSCULAR | Status: DC | PRN
  Administered 2021-07-15: 10 mg via INTRAVENOUS

## 2021-07-15 MED ORDER — ROCURONIUM BROMIDE 10 MG/ML (PF) SYRINGE
PREFILLED_SYRINGE | INTRAVENOUS | Status: DC | PRN
  Administered 2021-07-15: 10 mg via INTRAVENOUS
  Administered 2021-07-15: 70 mg via INTRAVENOUS

## 2021-07-15 MED ORDER — 0.9 % SODIUM CHLORIDE (POUR BTL) OPTIME
TOPICAL | Status: DC | PRN
  Administered 2021-07-15: 1000 mL

## 2021-07-15 MED ORDER — PANTOPRAZOLE SODIUM 40 MG PO TBEC
40.0000 mg | DELAYED_RELEASE_TABLET | Freq: Every day | ORAL | 0 refills | Status: DC
  Filled 2021-07-15: qty 90, 90d supply, fill #0

## 2021-07-15 MED ORDER — ACETAMINOPHEN 500 MG PO TABS
1000.0000 mg | ORAL_TABLET | Freq: Three times a day (TID) | ORAL | Status: DC
  Administered 2021-07-15 – 2021-07-16 (×2): 1000 mg via ORAL
  Filled 2021-07-15 (×3): qty 2

## 2021-07-15 MED ORDER — FENTANYL CITRATE (PF) 100 MCG/2ML IJ SOLN
INTRAMUSCULAR | Status: DC | PRN
  Administered 2021-07-15: 100 ug via INTRAVENOUS

## 2021-07-15 MED ORDER — HYDROMORPHONE HCL 1 MG/ML IJ SOLN
0.2500 mg | INTRAMUSCULAR | Status: DC | PRN
  Administered 2021-07-15: 0.5 mg via INTRAVENOUS
  Administered 2021-07-15 (×2): 0.25 mg via INTRAVENOUS
  Administered 2021-07-15: 0.5 mg via INTRAVENOUS

## 2021-07-15 MED ORDER — SUGAMMADEX SODIUM 500 MG/5ML IV SOLN
INTRAVENOUS | Status: DC | PRN
  Administered 2021-07-15: 500 mg via INTRAVENOUS

## 2021-07-15 MED ORDER — FENTANYL CITRATE (PF) 100 MCG/2ML IJ SOLN
INTRAMUSCULAR | Status: AC
  Filled 2021-07-15: qty 2

## 2021-07-15 MED ORDER — PROPOFOL 10 MG/ML IV BOLUS
INTRAVENOUS | Status: DC | PRN
  Administered 2021-07-15: 200 mg via INTRAVENOUS

## 2021-07-15 MED ORDER — KETAMINE HCL 10 MG/ML IJ SOLN
INTRAMUSCULAR | Status: AC
  Filled 2021-07-15: qty 1

## 2021-07-15 MED ORDER — ONDANSETRON HCL 4 MG/2ML IJ SOLN
INTRAMUSCULAR | Status: DC | PRN
  Administered 2021-07-15: 4 mg via INTRAVENOUS

## 2021-07-15 MED ORDER — LEVOTHYROXINE SODIUM 125 MCG PO TABS
125.0000 ug | ORAL_TABLET | Freq: Every day | ORAL | Status: DC
  Administered 2021-07-16: 125 ug via ORAL
  Filled 2021-07-15: qty 1

## 2021-07-15 MED ORDER — SCOPOLAMINE 1 MG/3DAYS TD PT72
1.0000 | MEDICATED_PATCH | TRANSDERMAL | Status: DC
  Administered 2021-07-15: 1.5 mg via TRANSDERMAL

## 2021-07-15 MED ORDER — LACTATED RINGERS IR SOLN
Status: DC | PRN
  Administered 2021-07-15: 1000 mL

## 2021-07-15 MED ORDER — APREPITANT 40 MG PO CAPS
40.0000 mg | ORAL_CAPSULE | ORAL | Status: AC
  Administered 2021-07-15: 40 mg via ORAL
  Filled 2021-07-15: qty 1

## 2021-07-15 MED ORDER — PHENYLEPHRINE 40 MCG/ML (10ML) SYRINGE FOR IV PUSH (FOR BLOOD PRESSURE SUPPORT)
PREFILLED_SYRINGE | INTRAVENOUS | Status: AC
  Filled 2021-07-15: qty 20

## 2021-07-15 MED ORDER — LIDOCAINE HCL (PF) 2 % IJ SOLN
INTRAMUSCULAR | Status: AC
  Filled 2021-07-15: qty 5

## 2021-07-15 MED ORDER — GUAIFENESIN ER 600 MG PO TB12
600.0000 mg | ORAL_TABLET | Freq: Two times a day (BID) | ORAL | Status: DC
  Administered 2021-07-15 – 2021-07-16 (×2): 600 mg via ORAL
  Filled 2021-07-15 (×3): qty 1

## 2021-07-15 MED ORDER — ALBUTEROL SULFATE HFA 108 (90 BASE) MCG/ACT IN AERS: 2.0000 | INHALATION_SPRAY | Freq: Four times a day (QID) | RESPIRATORY_TRACT | Status: DC | PRN

## 2021-07-15 MED ORDER — LACTATED RINGERS IV SOLN: INTRAVENOUS | Status: DC

## 2021-07-15 MED ORDER — SODIUM CHLORIDE 0.9 % IV SOLN
2.0000 g | INTRAVENOUS | Status: AC
  Administered 2021-07-15: 2 g via INTRAVENOUS
  Filled 2021-07-15: qty 2

## 2021-07-15 MED ORDER — DEXAMETHASONE SODIUM PHOSPHATE 10 MG/ML IJ SOLN
INTRAMUSCULAR | Status: AC
  Filled 2021-07-15: qty 1

## 2021-07-15 MED ORDER — BUPIVACAINE-EPINEPHRINE 0.25% -1:200000 IJ SOLN
INTRAMUSCULAR | Status: DC | PRN
  Administered 2021-07-15: 30 mL

## 2021-07-15 MED ORDER — PANTOPRAZOLE SODIUM 40 MG IV SOLR
40.0000 mg | Freq: Every day | INTRAVENOUS | Status: DC
  Administered 2021-07-15: 40 mg via INTRAVENOUS
  Filled 2021-07-15: qty 40

## 2021-07-15 MED ORDER — ALBUTEROL SULFATE (2.5 MG/3ML) 0.083% IN NEBU: 2.5000 mg | INHALATION_SOLUTION | Freq: Four times a day (QID) | RESPIRATORY_TRACT | Status: DC | PRN

## 2021-07-15 MED ORDER — ONDANSETRON HCL 4 MG/2ML IJ SOLN
4.0000 mg | INTRAMUSCULAR | Status: DC | PRN
  Administered 2021-07-15: 4 mg via INTRAVENOUS
  Filled 2021-07-15: qty 2

## 2021-07-15 MED ORDER — CHLORHEXIDINE GLUCONATE CLOTH 2 % EX PADS: 6.0000 | MEDICATED_PAD | Freq: Once | CUTANEOUS | Status: DC

## 2021-07-15 MED ORDER — OXYCODONE HCL 5 MG/5ML PO SOLN
5.0000 mg | Freq: Four times a day (QID) | ORAL | Status: DC | PRN
Start: 2021-07-15 — End: 2021-07-16

## 2021-07-15 MED ORDER — ACETAMINOPHEN 500 MG PO TABS
1000.0000 mg | ORAL_TABLET | ORAL | Status: AC
  Administered 2021-07-15: 1000 mg via ORAL
  Filled 2021-07-15: qty 2

## 2021-07-15 MED ORDER — MORPHINE SULFATE (PF) 2 MG/ML IV SOLN
1.0000 mg | INTRAVENOUS | Status: DC | PRN
  Administered 2021-07-15: 2 mg via INTRAVENOUS
  Filled 2021-07-15: qty 1

## 2021-07-15 MED ORDER — LIDOCAINE 2% (20 MG/ML) 5 ML SYRINGE
INTRAMUSCULAR | Status: DC | PRN
  Administered 2021-07-15: 50 mg via INTRAVENOUS

## 2021-07-15 MED ORDER — BUPIVACAINE LIPOSOME 1.3 % IJ SUSP: 20.0000 mL | Freq: Once | INTRAMUSCULAR | Status: DC

## 2021-07-15 MED ORDER — BUPIVACAINE-EPINEPHRINE (PF) 0.25% -1:200000 IJ SOLN
INTRAMUSCULAR | Status: AC
  Filled 2021-07-15: qty 30

## 2021-07-15 MED ORDER — ORAL CARE MOUTH RINSE: 15.0000 mL | Freq: Once | OROMUCOSAL | Status: AC

## 2021-07-15 MED ORDER — MIDAZOLAM HCL 2 MG/2ML IJ SOLN
INTRAMUSCULAR | Status: AC
  Filled 2021-07-15: qty 2

## 2021-07-15 MED ORDER — GUAIFENESIN ER 600 MG PO TB12
600.0000 mg | ORAL_TABLET | Freq: Two times a day (BID) | ORAL | 0 refills | Status: AC
  Filled 2021-07-15: qty 60, 30d supply, fill #0

## 2021-07-15 MED ORDER — ROCURONIUM BROMIDE 10 MG/ML (PF) SYRINGE
PREFILLED_SYRINGE | INTRAVENOUS | Status: AC
  Filled 2021-07-15: qty 10

## 2021-07-15 MED ORDER — CHLORHEXIDINE GLUCONATE 0.12 % MT SOLN
15.0000 mL | Freq: Once | OROMUCOSAL | Status: AC
  Administered 2021-07-15: 15 mL via OROMUCOSAL

## 2021-07-15 MED ORDER — ONDANSETRON 4 MG PO TBDP
4.0000 mg | ORAL_TABLET | Freq: Four times a day (QID) | ORAL | 0 refills | Status: DC | PRN
  Filled 2021-07-15: qty 20, 5d supply, fill #0

## 2021-07-15 MED ORDER — ONDANSETRON HCL 4 MG/2ML IJ SOLN
INTRAMUSCULAR | Status: AC
  Filled 2021-07-15: qty 2

## 2021-07-15 MED ORDER — OXYCODONE HCL 5 MG/5ML PO SOLN: 5.0000 mg | Freq: Once | ORAL | Status: DC | PRN

## 2021-07-15 MED ORDER — HYDROMORPHONE HCL 1 MG/ML IJ SOLN
INTRAMUSCULAR | Status: AC
  Filled 2021-07-15: qty 1

## 2021-07-15 MED ORDER — GABAPENTIN 300 MG PO CAPS
300.0000 mg | ORAL_CAPSULE | ORAL | Status: AC
  Administered 2021-07-15: 300 mg via ORAL
  Filled 2021-07-15: qty 1

## 2021-07-15 MED ORDER — ENSURE MAX PROTEIN PO LIQD
2.0000 [oz_av] | ORAL | Status: DC
  Administered 2021-07-16 (×5): 2 [oz_av] via ORAL

## 2021-07-15 MED ORDER — MIDAZOLAM HCL 5 MG/5ML IJ SOLN
INTRAMUSCULAR | Status: DC | PRN
  Administered 2021-07-15: 2 mg via INTRAVENOUS

## 2021-07-15 MED ORDER — KETAMINE HCL 10 MG/ML IJ SOLN
INTRAMUSCULAR | Status: DC | PRN
  Administered 2021-07-15: 40 mg via INTRAVENOUS

## 2021-07-15 MED ORDER — PROPOFOL 10 MG/ML IV BOLUS
INTRAVENOUS | Status: AC
  Filled 2021-07-15: qty 20

## 2021-07-15 MED ORDER — SCOPOLAMINE 1 MG/3DAYS TD PT72
MEDICATED_PATCH | TRANSDERMAL | Status: AC
  Filled 2021-07-15: qty 1

## 2021-07-15 MED ORDER — SUGAMMADEX SODIUM 500 MG/5ML IV SOLN
INTRAVENOUS | Status: AC
  Filled 2021-07-15: qty 5

## 2021-07-15 MED ORDER — OXYCODONE HCL 5 MG PO TABS: 5.0000 mg | ORAL_TABLET | Freq: Once | ORAL | Status: DC | PRN

## 2021-07-15 MED ORDER — GABAPENTIN 100 MG PO CAPS
200.0000 mg | ORAL_CAPSULE | Freq: Two times a day (BID) | ORAL | 0 refills | Status: DC
  Filled 2021-07-15: qty 20, 5d supply, fill #0

## 2021-07-15 MED ORDER — ACETAMINOPHEN 500 MG PO TABS: 1000.0000 mg | ORAL_TABLET | Freq: Three times a day (TID) | ORAL | 0 refills | Status: AC

## 2021-07-15 MED ORDER — ONDANSETRON HCL 4 MG/2ML IJ SOLN: 4.0000 mg | Freq: Once | INTRAMUSCULAR | Status: DC | PRN

## 2021-07-15 SURGICAL SUPPLY — 68 items
APPLIER CLIP 5 13 M/L LIGAMAX5 (MISCELLANEOUS)
APPLIER CLIP ROT 10 11.4 M/L (STAPLE)
BAG COUNTER SPONGE SURGICOUNT (BAG) ×3 IMPLANT
BLADE SURG SZ11 CARB STEEL (BLADE) ×3 IMPLANT
CANNULA REDUC XI 12-8 STAPL (CANNULA) ×1
CANNULA REDUCER 12-8 DVNC XI (CANNULA) ×2 IMPLANT
CHLORAPREP W/TINT 26 (MISCELLANEOUS) ×3 IMPLANT
CLIP APPLIE 5 13 M/L LIGAMAX5 (MISCELLANEOUS) IMPLANT
CLIP APPLIE ROT 10 11.4 M/L (STAPLE) IMPLANT
COVER SURGICAL LIGHT HANDLE (MISCELLANEOUS) ×3 IMPLANT
COVER TIP SHEARS 8 DVNC (MISCELLANEOUS) IMPLANT
COVER TIP SHEARS 8MM DA VINCI (MISCELLANEOUS)
DECANTER SPIKE VIAL GLASS SM (MISCELLANEOUS) IMPLANT
DERMABOND ADVANCED (GAUZE/BANDAGES/DRESSINGS) ×1
DERMABOND ADVANCED .7 DNX12 (GAUZE/BANDAGES/DRESSINGS) ×2 IMPLANT
DRAPE ARM DVNC X/XI (DISPOSABLE) ×8 IMPLANT
DRAPE COLUMN DVNC XI (DISPOSABLE) ×2 IMPLANT
DRAPE DA VINCI XI ARM (DISPOSABLE) ×4
DRAPE DA VINCI XI COLUMN (DISPOSABLE) ×1
ELECT REM PT RETURN 15FT ADLT (MISCELLANEOUS) ×3 IMPLANT
GAUZE 4X4 16PLY ~~LOC~~+RFID DBL (SPONGE) ×3 IMPLANT
GLOVE SURG ENC MOIS LTX SZ7.5 (GLOVE) ×6 IMPLANT
GLOVE SURG UNDER LTX SZ8 (GLOVE) ×6 IMPLANT
GOWN STRL REUS W/TWL XL LVL3 (GOWN DISPOSABLE) ×6 IMPLANT
GRASPER SUT TROCAR 14GX15 (MISCELLANEOUS) ×3 IMPLANT
HEMOSTAT SNOW SURGICEL 2X4 (HEMOSTASIS) IMPLANT
IRRIG SUCT STRYKERFLOW 2 WTIP (MISCELLANEOUS) ×3
IRRIGATION SUCT STRKRFLW 2 WTP (MISCELLANEOUS) ×2 IMPLANT
KIT BASIN OR (CUSTOM PROCEDURE TRAY) ×3 IMPLANT
LUBRICANT JELLY K Y 4OZ (MISCELLANEOUS) IMPLANT
MARKER SKIN DUAL TIP RULER LAB (MISCELLANEOUS) ×3 IMPLANT
NEEDLE SPNL 18GX3.5 QUINCKE PK (NEEDLE) ×3 IMPLANT
PACK CARDIOVASCULAR III (CUSTOM PROCEDURE TRAY) ×3 IMPLANT
PAD POSITIONING PINK XL (MISCELLANEOUS) IMPLANT
RELOAD STAPLER 2.5X60 WHT DVNC (STAPLE) ×4 IMPLANT
RELOAD STAPLER 3.5X60 BLU DVNC (STAPLE) ×2 IMPLANT
RELOAD STAPLER 4.3X60 GRN DVNC (STAPLE) ×2 IMPLANT
SCISSORS LAP 5X35 DISP (ENDOMECHANICALS) IMPLANT
SEAL CANN UNIV 5-8 DVNC XI (MISCELLANEOUS) ×8 IMPLANT
SEAL XI 5MM-8MM UNIVERSAL (MISCELLANEOUS) ×4
SEALER SYNCHRO 8 IS4000 DV (MISCELLANEOUS) ×1
SEALER SYNCHRO 8 IS4000 DVNC (MISCELLANEOUS) ×2 IMPLANT
SLEEVE GASTRECTOMY 40FR VISIGI (MISCELLANEOUS) IMPLANT
SOL ANTI FOG 6CC (MISCELLANEOUS) ×2 IMPLANT
SOLUTION ANTI FOG 6CC (MISCELLANEOUS) ×1
SOLUTION ELECTROLUBE (MISCELLANEOUS) IMPLANT
STAPLER 60 DA VINCI SURE FORM (STAPLE) ×1
STAPLER 60 SUREFORM DVNC (STAPLE) ×2 IMPLANT
STAPLER CANNULA SEAL DVNC XI (STAPLE) ×2 IMPLANT
STAPLER CANNULA SEAL XI (STAPLE) ×1
STAPLER RELOAD 2.5X60 WHITE (STAPLE) ×2
STAPLER RELOAD 2.5X60 WHT DVNC (STAPLE) ×4
STAPLER RELOAD 3.5X60 BLU DVNC (STAPLE) ×2
STAPLER RELOAD 3.5X60 BLUE (STAPLE) ×1
STAPLER RELOAD 4.3X60 GREEN (STAPLE) ×1
STAPLER RELOAD 4.3X60 GRN DVNC (STAPLE) ×2
SUT MNCRL AB 4-0 PS2 18 (SUTURE) ×3 IMPLANT
SUT VIC AB 0 CT1 27 (SUTURE) ×1
SUT VIC AB 0 CT1 27XBRD ANBCTR (SUTURE) ×2 IMPLANT
SUT VIC AB 2-0 SH 27 (SUTURE)
SUT VIC AB 2-0 SH 27XBRD (SUTURE) IMPLANT
SYR 20ML LL LF (SYRINGE) ×3 IMPLANT
TOWEL OR 17X26 10 PK STRL BLUE (TOWEL DISPOSABLE) ×3 IMPLANT
TRAY FOLEY MTR SLVR 16FR STAT (SET/KITS/TRAYS/PACK) IMPLANT
TROCAR ADV FIXATION 12X100MM (TROCAR) ×3 IMPLANT
TROCAR Z-THREAD FIOS 5X100MM (TROCAR) ×3 IMPLANT
TUBE CALIBRATION LAPBAND (TUBING) IMPLANT
TUBING INSUFFLATION 10FT LAP (TUBING) ×3 IMPLANT

## 2021-07-15 NOTE — Anesthesia Preprocedure Evaluation (Addendum)
Anesthesia Evaluation  Patient identified by MRN, date of birth, ID band Patient awake    Reviewed: Allergy & Precautions, NPO status , Patient's Chart, lab work & pertinent test results  Airway Mallampati: III  TM Distance: >3 FB Neck ROM: Full   Comment: Pierced right nostril Dental no notable dental hx. (+) Teeth Intact, Dental Advisory Given   Pulmonary asthma ,    Pulmonary exam normal breath sounds clear to auscultation       Cardiovascular negative cardio ROS Normal cardiovascular exam Rhythm:Regular Rate:Normal     Neuro/Psych negative neurological ROS  negative psych ROS   GI/Hepatic Neg liver ROS, GERD  Medicated and Controlled,  Endo/Other  diabetesHypothyroidism Morbid obesity  Renal/GU negative Renal ROS  negative genitourinary   Musculoskeletal negative musculoskeletal ROS (+)   Abdominal (+) + obese,   Peds  Hematology  (+) anemia ,   Anesthesia Other Findings   Reproductive/Obstetrics                             Anesthesia Physical Anesthesia Plan  ASA: 3  Anesthesia Plan: General   Post-op Pain Management:    Induction: Intravenous and Cricoid pressure planned  PONV Risk Score and Plan: 4 or greater and Treatment may vary due to age or medical condition, Scopolamine patch - Pre-op, Midazolam, Ondansetron and Dexamethasone  Airway Management Planned: Oral ETT  Additional Equipment:   Intra-op Plan:   Post-operative Plan: Extubation in OR  Informed Consent: I have reviewed the patients History and Physical, chart, labs and discussed the procedure including the risks, benefits and alternatives for the proposed anesthesia with the patient or authorized representative who has indicated his/her understanding and acceptance.     Dental advisory given  Plan Discussed with: CRNA and Anesthesiologist  Anesthesia Plan Comments:         Anesthesia Quick  Evaluation

## 2021-07-15 NOTE — Anesthesia Procedure Notes (Signed)
Procedure Name: Intubation Date/Time: 07/15/2021 9:58 AM Performed by: Gerald Leitz, CRNA Pre-anesthesia Checklist: Patient identified, Patient being monitored, Timeout performed, Emergency Drugs available and Suction available Patient Re-evaluated:Patient Re-evaluated prior to induction Oxygen Delivery Method: Circle system utilized Preoxygenation: Pre-oxygenation with 100% oxygen Induction Type: IV induction Ventilation: Mask ventilation without difficulty Laryngoscope Size: Mac and 3 Grade View: Grade I Tube type: Oral Tube size: 7.5 mm Number of attempts: 1 Placement Confirmation: ETT inserted through vocal cords under direct vision, positive ETCO2 and breath sounds checked- equal and bilateral Secured at: 22 cm Tube secured with: Tape Dental Injury: Teeth and Oropharynx as per pre-operative assessment

## 2021-07-15 NOTE — Transfer of Care (Signed)
Immediate Anesthesia Transfer of Care Note  Patient: Christine Nelson  Procedure(s) Performed: Procedure(s): ROBOTIC SLEEVE GASTRECTOMY (N/A) UPPER GI ENDOSCOPY (N/A)  Patient Location: PACU  Anesthesia Type:General  Level of Consciousness: Alert, Awake, Oriented  Airway & Oxygen Therapy: Patient Spontanous Breathing  Post-op Assessment: Report given to RN  Post vital signs: Reviewed and stable  Last Vitals:  Vitals:   07/15/21 0805  BP: 131/80  Pulse: 81  Resp: 18  Temp: (!) 36.4 C  SpO2: 59%    Complications: No apparent anesthesia complications

## 2021-07-15 NOTE — Discharge Summary (Signed)
Patient ID: Christine Nelson [id number] 30 y.o. 05-20-1991  07/15/2021  Discharge date and time: 07/16/2021  Admitting Physician: Louanna Raw  Discharge Physician: Clovis Riley  Admission Diagnoses: Morbid obesity with BMI of 50.0-59.9, adult ([place]) [E66.01, Z68.43] Patient Active Problem List   Diagnosis Date Noted   Morbid obesity with BMI of 50.0-59.9, adult ([place]) 07/15/2021   Fibroid 08/24/2020   Iron deficiency anemia due to chronic blood loss 08/24/2020   Seasonal allergies 08/24/2020   History of hypothyroidism 03/15/2019   Class 3 severe obesity due to excess calories without serious comorbidity with body mass index (BMI) of 50.0 to 59.9 in adult [facility]) 03/15/2019   Prediabetes 02/04/2018   Hypothyroidism 02/04/2018   Excessive bleeding in premenopausal period 02/04/2018     Discharge Diagnoses: Morbid obesity Patient Active Problem List   Diagnosis Date Noted   Morbid obesity with BMI of 50.0-59.9, adult ([place]) 07/15/2021   Fibroid 08/24/2020   Iron deficiency anemia due to chronic blood loss 08/24/2020   Seasonal allergies 08/24/2020   History of hypothyroidism 03/15/2019   Class 3 severe obesity due to excess calories without serious comorbidity with body mass index (BMI) of 50.0 to 59.9 in adult [facility]) 03/15/2019   Prediabetes 02/04/2018   Hypothyroidism 02/04/2018   Excessive bleeding in premenopausal period 02/04/2018    Operations: Procedure(s): ROBOTIC SLEEVE GASTRECTOMY UPPER GI ENDOSCOPY  Admission Condition: good  Discharged Condition: good  Indication for Admission: Morbid obesity  Hospital Course: [name] presented for elective robotic sleeve gastrectomy.  She recovered well and was discharged.  Consults: None  Significant Diagnostic Studies: None  Treatments: surgery: As above  Disposition: Home  Patient Instructions:  Allergies as of 07/16/2021   No Known Allergies      Medication List     TAKE these  medications    acetaminophen 500 MG tablet Commonly known as: TYLENOL Take 2 tablets (1,000 mg total) by mouth every 8 (eight) hours for 5 days.   albuterol 108 (90 Base) MCG/ACT inhaler Commonly known as: VENTOLIN HFA Inhale 2 puffs into the lungs every 6 (six) hours as needed for shortness of breath or wheezing.   ELDERBERRY PO Take 1 capsule by mouth 2 (two) times a week.   gabapentin 100 MG capsule Commonly known as: NEURONTIN Take 2 capsules (200 mg total) by mouth every 12 (twelve) hours.   guaiFENesin 600 MG 12 hr tablet Commonly known as: Mucinex Take 1 tablet (600 mg total) by mouth 2 (two) times daily.   lansoprazole 30 MG capsule Commonly known as: Prevacid Take 1 capsule (30 mg total) by mouth daily at 12 noon.   levocetirizine 5 MG tablet Commonly known as: XYZAL Take 5 mg by mouth at bedtime.   levothyroxine 125 MCG tablet Commonly known as: SYNTHROID Take 125 mcg by mouth daily before breakfast.   Lupron Depot (42-Month) 3.75 MG injection Generic drug: leuprolide Inject 3.75 mg into the muscle every 28 (twenty-eight) days.   ondansetron 4 MG disintegrating tablet Commonly known as: ZOFRAN-ODT Dissolve 1 tablet by mouth every 6 (six) hours as needed for nausea or vomiting.   oxyCODONE 5 MG immediate release tablet Commonly known as: Oxy IR/ROXICODONE Take 1 tablet (5 mg total) by mouth every 6 (six) hours as needed for severe pain.   pantoprazole 40 MG tablet Commonly known as: PROTONIX Take 1 tablet (40 mg total) by mouth daily.   prenatal multivitamin Tabs tablet Take 1 tablet by mouth daily at 12 noon.   Vitamin  D (Ergocalciferol) 1.25 MG (50000 UNIT) Caps capsule Commonly known as: DRISDOL Take 1 capsule (50,000 Units total) by mouth every 7 (seven) days.   WOMENS MULTIVITAMIN PO Take 1 tablet by mouth daily.         Activity: no heavy lifting for 4 weeks Diet:  Bariatric protocol Wound Care: keep wound clean and dry  Follow-up:   With Dr. Thermon Leyland per bariatric follow up protocol.  Signed: Lorraine Lax, Bariatric, & Minimally Invasive Surgery [facility], [place]   07/16/2021, 8:29 AM

## 2021-07-15 NOTE — Progress Notes (Addendum)

## 2021-07-15 NOTE — Progress Notes (Signed)
PHARMACY CONSULT FOR:  Risk Assessment for Post-Discharge VTE Following Bariatric Surgery  Post-Discharge VTE Risk Assessment: This patient's probability of 30-day post-discharge VTE is increased due to the factors marked:   Female    Age >/=60 years  x  BMI >/=50 kg/m2    CHF    Dyspnea at Rest    Paraplegia  x  Non-gastric-band surgery    Operation Time >/=3 hr    Return to OR     Length of Stay >/= 3 d   Hx of VTE   Hypercoagulable condition   Significant venous stasis       Predicted probability of 30-day post-discharge VTE: 0.27%  Other patient-specific factors to consider: n/a   Recommendation for Discharge: No pharmacologic prophylaxis post-discharge    Christine Nelson is a 30 y.o. female who underwent laparoscopic sleeve gastrectomy on 07/15/21   Case start: 1013 Case end: 1141   No Known Allergies  Patient Measurements: Weight: (!) 149.1 kg (328 lb 9.6 oz) Body mass index is 58.21 kg/m.  No results for input(s): WBC, HGB, HCT, PLT, APTT, CREATININE, LABCREA, CREATININE, CREAT24HRUR, MG, PHOS, ALBUMIN, PROT, ALBUMIN, AST, ALT, ALKPHOS, BILITOT, BILIDIR, IBILI in the last 72 hours. Estimated Creatinine Clearance: 126.8 mL/min (by C-G formula based on SCr of 0.95 mg/dL).    Past Medical History:  Diagnosis Date   Asthma    with allergy season   Diabetes mellitus, type II (HCC)    Hgb A1c in 03/2019 was 6.8%   Fibroids    GERD (gastroesophageal reflux disease)    History of prediabetes    Hypothyroidism    Obesity    PCOS (polycystic ovarian syndrome)    Uterine leiomyoma    Vitamin D deficiency      Medications Prior to Admission  Medication Sig Dispense Refill Last Dose   albuterol (VENTOLIN HFA) 108 (90 Base) MCG/ACT inhaler Inhale 2 puffs into the lungs every 6 (six) hours as needed for shortness of breath or wheezing.   07/15/2021   ELDERBERRY PO Take 1 capsule by mouth 2 (two) times a week. (Patient not taking: Reported on 07/11/2021)       lansoprazole (PREVACID) 30 MG capsule Take 1 capsule (30 mg total) by mouth daily at 12 noon. (Patient not taking: Reported on 07/11/2021) 90 capsule 1    levocetirizine (XYZAL) 5 MG tablet Take 5 mg by mouth at bedtime. (Patient not taking: Reported on 07/11/2021)      levothyroxine (SYNTHROID) 125 MCG tablet Take 125 mcg by mouth daily before breakfast.   07/15/2021   LUPRON DEPOT, 67-MONTH, 3.75 MG injection Inject 3.75 mg into the muscle every 28 (twenty-eight) days. (Patient not taking: No sig reported)      Multiple Vitamins-Minerals (WOMENS MULTIVITAMIN PO) Take 1 tablet by mouth daily. (Patient not taking: Reported on 07/11/2021)      Prenatal Vit-Fe Fumarate-FA (PRENATAL MULTIVITAMIN) TABS tablet Take 1 tablet by mouth daily at 12 noon. (Patient not taking: Reported on 07/11/2021)      Vitamin D, Ergocalciferol, (DRISDOL) 1.25 MG (50000 UNIT) CAPS capsule Take 1 capsule (50,000 Units total) by mouth every 7 (seven) days. (Patient not taking: Reported on 07/11/2021) 12 capsule 0        Kara Mead 07/15/2021,12:31 PM

## 2021-07-15 NOTE — Progress Notes (Signed)
Pt started on water and IS at 1500.00

## 2021-07-15 NOTE — Anesthesia Postprocedure Evaluation (Signed)
Anesthesia Post Note  Patient: Environmental manager  Procedure(s) Performed: ROBOTIC SLEEVE GASTRECTOMY (Abdomen) UPPER GI ENDOSCOPY     Patient location during evaluation: PACU Anesthesia Type: General Level of consciousness: awake and alert and oriented Pain management: pain level controlled Vital Signs Assessment: post-procedure vital signs reviewed and stable Respiratory status: spontaneous breathing, nonlabored ventilation and respiratory function stable Cardiovascular status: blood pressure returned to baseline and stable Postop Assessment: no apparent nausea or vomiting Anesthetic complications: no   No notable events documented.  Last Vitals:  Vitals:   07/15/21 1245 07/15/21 1300  BP: (!) 155/91 (!) 151/89  Pulse: 92 94  Resp: (!) 22 (!) 24  Temp:  36.4 C  SpO2: 95% 94%    Last Pain:  Vitals:   07/15/21 1300  TempSrc:   PainSc: Asleep                 Kunal Levario A.

## 2021-07-15 NOTE — H&P (Signed)
Admitting Physician: Nickola Major Kaili Castille  Service: General surgery  CC: Morbid obesity  Subjective   HPI: Christine Nelson is an 30 y.o. female who is here for elective robotic sleeve gastrectomy.  Past Medical History:  Diagnosis Date   Asthma    with allergy season   Diabetes mellitus, type II (HCC)    Hgb A1c in 03/2019 was 6.8%   Fibroids    GERD (gastroesophageal reflux disease)    History of prediabetes    Hypothyroidism    Obesity    PCOS (polycystic ovarian syndrome)    Uterine leiomyoma    Vitamin D deficiency     Past Surgical History:  Procedure Laterality Date   BIOPSY  05/09/2021   Procedure: BIOPSY;  Surgeon: Felicie Morn, MD;  Location: [facility];  Service: General;;   ESOPHAGOGASTRODUODENOSCOPY N/A 05/09/2021   Procedure: ESOPHAGOGASTRODUODENOSCOPY (EGD) with biopsies;  Surgeon: Felicie Morn, MD;  Location: [facility];  Service: General;  Laterality: N/A;   WISDOM TOOTH EXTRACTION  2011    Family History  Problem Relation Age of Onset   Hypertension Mother    Thyroid disease Father    Hypertension Maternal Grandfather    Stroke Maternal Grandfather     Social:  reports that she has never smoked. She has never used smokeless tobacco. She reports that she does not drink alcohol and does not use drugs.  Allergies: No Known Allergies  Medications: Current Outpatient Medications  Medication Instructions   albuterol (VENTOLIN HFA) 108 (90 Base) MCG/ACT inhaler 2 puffs, Inhalation, Every 6 hours PRN   ELDERBERRY PO 1 capsule, 2 times weekly   lansoprazole (PREVACID) 30 mg, Oral, Daily   levocetirizine (XYZAL) 5 mg, Daily at bedtime   levothyroxine (SYNTHROID) 125 mcg, Oral, Daily before breakfast   Lupron Depot (64-Month) 3.75 mg, Every 28 days   Multiple Vitamins-Minerals (WOMENS MULTIVITAMIN PO) 1 tablet, Daily   Prenatal Vit-Fe Fumarate-FA (PRENATAL MULTIVITAMIN) TABS tablet 1 tablet, Daily   Vitamin D (Ergocalciferol)  (DRISDOL) 50,000 Units, Oral, Every 7 days    ROS - all of the below systems have been reviewed with the patient and positives are indicated with bold text General: chills, fever or night sweats Eyes: blurry vision or double vision ENT: epistaxis or sore throat Allergy/Immunology: itchy/watery eyes or nasal congestion Hematologic/Lymphatic: bleeding problems, blood clots or swollen lymph nodes Endocrine: temperature intolerance or unexpected weight changes Breast: new or changing breast lumps or nipple discharge Resp: cough, shortness of breath, or wheezing CV: chest pain or dyspnea on exertion GI: as per HPI GU: dysuria, trouble voiding, or hematuria MSK: joint pain or joint stiffness Neuro: TIA or stroke symptoms Derm: pruritus and skin lesion changes Psych: anxiety and depression  Objective   PE There were no vitals taken for this visit. Constitutional: NAD; conversant; no deformities Eyes: Moist conjunctiva; no lid lag; anicteric; PERRL Neck: Trachea midline; no thyromegaly Lungs: Normal respiratory effort; no tactile fremitus CV: RRR; no palpable thrills; no pitting edema GI: Abd Soft, non-tender; no palpable hepatosplenomegaly MSK: Normal range of motion of extremities; no clubbing/cyanosis Psychiatric: Appropriate affect; alert and oriented x3 Lymphatic: No palpable cervical or axillary lymphadenopathy  No results found for this or any previous visit (from the past 24 hour(s)).  Imaging Orders  No imaging studies ordered today     Assessment and Plan   [name] is a 30 year old female presenting for bariatric surgery consultation. She has a BMI of 58.19, and type 2 diabetes.  I entered the patient's information into the [facility] bariatric risk-benefit calculator. I used this as a outlined to facilitate our discussion of the risks, and benefits of bariatric surgery, specifically comparing robotic sleeve gastrectomy versus robotic Roux-en-Y gastric bypass. I also used  the [facility] Individualized Metabolic Surgery Score calculator to discuss the surgery's effect on diabetes - this calculator stratified her as having mild diabetes and recommended gastric bypass for 90% chance of diabetes remission vs. 75% chance with a sleeve. The patient is interested in pursuing a sleeve gastrectomy. We will initiate the preoperative evaluation to include the following:  - Bloodwork - notably her TSH was elevated, she will see her PCP for adjustment - Nutrition consult - Completed with Sandie Ano - CXR 04/10/21 with no acute cardiopulmonary disease - EKG 04/10/21 with normal sinus rhythm - Psychology evaluation - completed with Dr. Ardath Sax - EGD with biopsy - negative for H. Pylori  We again discussed the procedure as well as its risks, benefits and alternatives of robotic sleeve gastrectomy.  After a full discussion and all questions answered the patient granted consent to proceed.  We will proceed as scheduled.   Felicie Morn, MD  [facility] Use [email or web address] to contact on call provider

## 2021-07-15 NOTE — Op Note (Signed)
Patient: Christine Nelson (1991/08/08, [id number])  Date of Surgery: 07/15/2021   Preoperative Diagnosis: morbid obesity, BMI 58  Postoperative Diagnosis: morbid obesity, BMI 58   Surgical Procedure: ROBOTIC SLEEVE GASTRECTOMY: 79150 (CPT) UPPER GI ENDOSCOPY: [id number]   Operative Team Members:  Surgeon(s) and Role:    * Leea Rambeau, Nickola Major, MD - Primary    * Clovis Riley, MD - Assisting   Anesthesiologist: Josephine Igo, MD CRNA: Milford Cage, CRNA; Gerald Leitz, CRNA   Anesthesia: General   Fluids:  Total I/O In: 1100 [I.V.:1000; IV [id number]:270] Out: -   Complications: None  Drains:  none   Specimen:  ID Type Source Tests Collected by Time Destination  1 : Greater curviture of stomach Tissue PATH GI benign resection SURGICAL PATHOLOGY Joash Tony, Nickola Major, MD 07/15/2021 1037      Disposition:  PACU - hemodynamically stable.  Plan of Care: Admit to inpatient     Indications for Procedure:  Ms. Mastrangelo is a 30 year old female presenting for bariatric surgery consultation. She has a BMI of 58.19, and type 2 diabetes. I entered the patient's information into the [facility] bariatric risk-benefit calculator. I used this as a outlined to facilitate our discussion of the risks, and benefits of bariatric surgery, specifically comparing robotic sleeve gastrectomy versus robotic Roux-en-Y gastric bypass. I also used the [facility] Individualized Metabolic Surgery Score calculator to discuss the surgery's effect on diabetes - this calculator stratified her as having mild diabetes and recommended gastric bypass for 90% chance of diabetes remission vs. 75% chance with a sleeve. The patient is interested in pursuing a sleeve gastrectomy. We will initiate the preoperative evaluation to include the following:   - Bloodwork - notably her TSH was elevated, she will see her PCP for adjustment - Nutrition consult - Completed with Sandie Ano - CXR 04/10/21 with  no acute cardiopulmonary disease - EKG 04/10/21 with normal sinus rhythm - Psychology evaluation - completed with Dr. Ardath Sax - EGD with biopsy - negative for H. Pylori   We again discussed the procedure as well as its risks, benefits and alternatives of robotic sleeve gastrectomy.  After a full discussion and all questions answered the patient granted consent to proceed.  We will proceed as scheduled.   Findings: Normal anatomy  Infection status: Patient: Private Patient Elective Case Case: Elective Infection Present At Time Of Surgery (PATOS): Some spillage of foregut and jejunal contents while creating anastomoses   Description of Procedure:   On the date stated above, the patient was taken to the operating room suite and placed in supine positioning.  General endotracheal anesthesia was induced.  A timeout was completed verifying the correct patient, procedure, positioning and equipment needed for the case.  The patient's abdomen was prepped and draped in the usual sterile fashion.  I entered the patient's left upper quadrant using a 5 mm trocar in the optical technique.  There was no trauma to underlying viscera with initial trocar placement.  The abdomen was insufflated 15 mmHg.  A total of 4 robotic trochars were placed across the mid abdomen, including the 5 mm initial trocar being upsized to a 8 mm trocar.  The robotic stapler trocar was placed in the number two position.  The [facility] liver retractor was placed through the subxiphoid region and under the left lobe of the liver and was connected to the rail of the bed.  A TAP block was placed using marcaine and Exparel under direct vision of the  laparoscope.  The [place] was docked and we transitioned to robotic surgery.   Using the tip up fenestrated grasper, fenestrated bipolar, 30 degree camera and Synchroseal from the patient's right to left, we began by dissecting the angle of His off the left crus of the  diaphragm.  The adhesions between the stomach, spleen and diaphragm were divided using the Synchroseal to define the angle of His.  I then started 6 cm away from the pylorus along the greater curve the stomach and divided the gastroepiploic vessels and the gastrocolic ligament.  The lesser sac was entered.  There were really no adhesions to the posterior wall of the stomach.  The greater curve was mobilized working superiorly toward the spleen.  All of the gastroepiploic and short gastric vessels were divided as we divided the gastrocolic and gastrosplenic ligaments.  As we reached the splenic hilum, I lifted the stomach anteriorly.  I created a tunnel between the stomach and its attachments to the retroperitoneum posteriorly just to the left of the GE junction until I encountered the left crus and my previous angle of His dissection.  We then were able to approach the shortest of the short gastrics both from the greater curve the stomach laterally and from the left crus medially.  These were divided using the Synchroseal and the fundus of the stomach was fully mobilized.  With the stomach fully mobilized we direct our attention to stapling.  A 40 French VISI G was inserted into the stomach and positioned along the lesser curve the stomach and suction was applied.  The 60 mm robotic sureform linear stapler was used to create the sleeve gastrectomy.  We started 6 cm from the pylorus and were careful to avoid narrowing at the incisura.  We stayed about 1 cm away from the GE junction to protect the sling fibers.  We used 1 green load, 1 blue loads, and 4 white loads of the linear stapler.    An omentopexy was performed.  A 2-0 vicryl suture was run the length of the staple line to reconnect the divided omentum to the staple line.  The suture was locked in areas of oozing on the staple line.  With the sleeve gastrectomy completed the VISI G was taken off suction and removed and we performed an upper endoscopy.  The  foregut was submerged in saline irrigation and the adult upper endoscope was inserted into the stomach as far as the pylorus to inspect the sleeve.  The sleeve appeared appropriately oriented without any twisting.  There was good hemostasis.  The sleeve was widely patent at the incisura with no narrowing.  There was no significant retained fundus.  The sleeve was inflated with the endoscope and there was no bubbling of the irrigation, suggesting a negative leak test and a airtight sleeve gastrectomy.  The foregut was decompressed with the endoscope and the endoscope was removed.  There was good hemostasis at the end of the case.  The robot was undocked and moved away from the field.  The sleeve gastrectomy specimen was removed from the stapler port.  The fascia of the stapler port was closed using a 0 Vicryl on a PMI suture passer.   The liver retractor was removed under direct vision.  The pneumoperitoneum was evacuated.  The skin was closed using 4-0 Monocryl and Dermabond.  All sponge and needle counts were correct at the end of the case.    Louanna Raw, MD General, Bariatric, &  Minimally Invasive Surgery [facility] Surgery, [place]

## 2021-07-16 ENCOUNTER — Encounter (HOSPITAL_COMMUNITY): Payer: Self-pay | Admitting: Surgery

## 2021-07-16 LAB — CBC WITH DIFFERENTIAL/PLATELET
Abs Immature Granulocytes: 0.03 10*3/uL (ref 0.00–0.07)
Basophils Absolute: 0 10*3/uL (ref 0.0–0.1)
Basophils Relative: 0 %
Eosinophils Absolute: 0 10*3/uL (ref 0.0–0.5)
Eosinophils Relative: 0 %
HCT: 37.6 % (ref 36.0–46.0)
Hemoglobin: 12 g/dL (ref 12.0–15.0)
Immature Granulocytes: 0 %
Lymphocytes Relative: 20 %
Lymphs Abs: 1.7 10*3/uL (ref 0.7–4.0)
MCH: 26.4 pg (ref 26.0–34.0)
MCHC: 31.9 g/dL (ref 30.0–36.0)
MCV: 82.6 fL (ref 80.0–100.0)
Monocytes Absolute: 0.3 10*3/uL (ref 0.1–1.0)
Monocytes Relative: 3 %
Neutro Abs: 6.5 10*3/uL (ref 1.7–7.7)
Neutrophils Relative %: 77 %
Platelets: 343 10*3/uL (ref 150–400)
RBC: 4.55 MIL/uL (ref 3.87–5.11)
RDW: 15.4 % (ref 11.5–15.5)
WBC: 8.5 10*3/uL (ref 4.0–10.5)
nRBC: 0 % (ref 0.0–0.2)

## 2021-07-16 NOTE — Progress Notes (Signed)
Nutrition Education Note  Received consult for diet education for patient s/p bariatric surgery. She is POD #1 sleeve gastrectomy.  Discussed 2 week post op diet with pt. Emphasized that liquids must be non carbonated, non caffeinated, and sugar free. Fluid goals discussed. Pt to follow up with outpatient bariatric RD for further diet progression after 2 weeks. Multivitamins and minerals also reviewed. Teach back method used, pt expressed understanding, expect good compliance. She has already purchased protein shakes for home use and is receiving multivitamin and calcium supplement from [facility] prior to d/c.  If nutrition issues arise, please consult RD.     Jarome Matin, MS, RD, LDN, CNSC Inpatient Clinical Dietitian RD pager # available in [place]  After hours/weekend pager # available in [facility]

## 2021-07-16 NOTE — Progress Notes (Signed)
S: Slept well, denies any pain currently.  Tolerating liquids and proteins without difficulty.  O: Vitals, labs, intake/output, and orders reviewed at this time.  Afebrile, no tachycardia, normotensive.  P.o. intake 420, urine output 3150.  Hemoglobin stable   Gen: A&Ox3, no distress  H&N: EOMI, atraumatic, neck supple Chest: unlabored respirations, RRR Abd: soft, nontender, nondistended, incision(s) c/d/i with Dermabond, no cellulitis or hematoma Ext: warm, no edema Neuro: grossly normal  Lines/tubes/drains: piv  A/P: Postop day 1 status post robotic sleeve gastrectomy, doing well -Continue liquids and protein shakes Continue SCDs, Lovenox, pulmonary toilet, ambulation -Discharge home today   Romana Juniper, MD [facility] Surgery, [place]

## 2021-07-16 NOTE — Progress Notes (Signed)
Patient completed of her six cup of water. No c/o N/V. Started 1st cup of protein at this time.

## 2021-07-16 NOTE — Plan of Care (Signed)
Instructions were reviewed with patient. All questions were answered. Patient was transported to main entrance by wheelchair. ° °

## 2021-07-16 NOTE — Progress Notes (Signed)
Patient alert and oriented, Post op day 1.  Provided support and encouragement.  Encouraged pulmonary toilet, ambulation and small sips of liquids.  All questions answered.  Will continue to monitor. 

## 2021-07-16 NOTE — Progress Notes (Signed)
24hr fluid recall: 41mL. Per dehydration protocol, will call pt to f/u within one week post op.

## 2021-07-16 NOTE — Progress Notes (Signed)
Patient alert and oriented, pain is controlled. Patient is tolerating fluids, advanced to protein shake today, patient is tolerating well. Reviewed Gastric Bypass discharge instructions with patient and patient is able to articulate understanding. Provided information on BELT program, Support Group and [facility] outpatient pharmacy. All questions answered, will continue to monitor.    

## 2021-07-16 NOTE — Discharge Instructions (Signed)

## 2021-07-17 LAB — SURGICAL PATHOLOGY

## 2021-07-18 ENCOUNTER — Telehealth (HOSPITAL_COMMUNITY): Payer: Self-pay | Admitting: *Deleted

## 2021-07-18 NOTE — Telephone Encounter (Signed)
1.  Tell me about your pain and pain management? Pt c/o right lower abdominal incisional pain with movement and exertion.  Pt states that she has not had to take any pain medication.   2.  Let's talk about fluid intake.  How much total fluid are you taking in? Pt states that she is working to meet goal of 64 oz of fluid today.  Pt was able to consume 50 fl oz yesterday.  Pt plans to meet goal today by drinking rest of protein, water, and Gatorade zero for the remainder of the day to meet goal.  3.  How much protein have you taken in the last 2 days? Pt states that she is working to meet goal of goal of 60g of protein today.  Pt has already consumed one protein shake.  Pt plans to drink remainder of protein throughout the rest of the day to meet goal.  4.  Have you had nausea?  Tell me about when have experienced nausea and what you did to help? Pt denies nausea.   5.  Has the frequency or color changed with your urine? Pt states that she is urinating "fine" with no changes in frequency or urgency.  Pt states that urine is "very clear".   6.  Tell me what your incisions look like? "Incisions look fine". Pt denies a fever, chills.  Pt states incisions are not swollen, open, or draining.  Pt encouraged to call [facility] if incisions change.   7.  Have you been passing gas? BM? Pt states that she is having BMs. Last BM 07/18/21.    8.  If a problem or question were to arise who would you call?  Do you know contact numbers for [place], [facility], and [facility]? Pt denies dehydration symptoms.  Pt can describe s/sx of dehydration.  Pt knows to call [facility] for surgical, [facility] for nutrition, and [place] for non-urgent questions or concerns.   9.  How has the walking going? Pt states she is walking around and able to be active without difficulty.   10. Are you still using your incentive spirometer?  If so, how often? Pt states that she is using the I.S. often. Pt encouraged to use incentive spirometer, at least 10x every hour  while awake until she sees the surgeon.  11.  How are your vitamins and calcium going?  How are you taking them? Pt states that she is taking her bariatric vitamin, but has not started calcium.  Discussed plan to begin calcium.   Reminded patient that the first 30 days post-operatively are important for successful recovery.  Practice good hand hygiene, wearing a mask when appropriate (since optional in most places), and minimizing exposure to people who live outside of the home, especially if they are exhibiting any respiratory, GI, or illness-like symptoms.

## 2021-07-24 DIAGNOSIS — F329 Major depressive disorder, single episode, unspecified: Secondary | ICD-10-CM | POA: Diagnosis not present

## 2021-07-29 ENCOUNTER — Encounter: Payer: BC Managed Care – PPO | Attending: Surgery | Admitting: Skilled Nursing Facility1

## 2021-07-29 ENCOUNTER — Other Ambulatory Visit: Payer: Self-pay

## 2021-07-29 DIAGNOSIS — Z6841 Body Mass Index (BMI) 40.0 and over, adult: Secondary | ICD-10-CM | POA: Insufficient documentation

## 2021-07-30 DIAGNOSIS — F329 Major depressive disorder, single episode, unspecified: Secondary | ICD-10-CM | POA: Diagnosis not present

## 2021-07-30 NOTE — Progress Notes (Signed)
2 Week Post-Operative Nutrition Class   Patient was seen on 07/29/2021 for Post-Operative Nutrition education at the [facility].    Surgery date: 07/15/2021 Surgery type: sleeve Start weight at [facility]: 331.7 pounds Weight today: 337 pounds Bowel Habits: Every day to every other day no complaints   Body Composition Scale 07/29/2021  Current Body Weight 307.2  Total Body Fat % 49.1  Visceral Fat 16  Fat-Free Mass % 50.8   Total Body Water % 39.9  Muscle-Mass lbs 33.2  BMI 54  Body Fat Displacement          Torso  lbs 93.8         Left Leg  lbs 18.7         Right Leg  lbs 18.7         Left Arm  lbs 9.3         Right Arm   lbs 9.3      The following the learning objectives were met by the patient during this course: Identifies Phase 3 (Soft, High Proteins) Dietary Goals and will begin from 2 weeks post-operatively to 2 months post-operatively Identifies appropriate sources of fluids and proteins  Identifies appropriate fat sources and healthy verses unhealthy fat types   States protein recommendations and appropriate sources post-operatively Identifies the need for appropriate texture modifications, mastication, and bite sizes when consuming solids Identifies appropriate fat consumption and sources Identifies appropriate multivitamin and calcium sources post-operatively Describes the need for physical activity post-operatively and will follow MD recommendations States when to call healthcare provider regarding medication questions or post-operative complications   Handouts given during class include: Phase 3A: Soft, High Protein Diet Handout Phase 3 High Protein Meals Healthy Fats   Follow-Up Plan: Patient will follow-up at [facility] in 6 weeks for 2 month post-op nutrition visit for diet advancement per MD.

## 2021-08-04 ENCOUNTER — Telehealth: Payer: Self-pay | Admitting: Skilled Nursing Facility1

## 2021-08-04 NOTE — Telephone Encounter (Signed)
RD called pt to verify fluid intake once starting soft, solid proteins 2 week post-bariatric surgery.   Daily Fluid intake: 55-64 oz Daily Protein intake: 60 g Bowel Habits: every day to every other day  Concerns/issues:   Pt states seafood goes the best.

## 2021-08-06 DIAGNOSIS — F329 Major depressive disorder, single episode, unspecified: Secondary | ICD-10-CM | POA: Diagnosis not present

## 2021-08-07 ENCOUNTER — Other Ambulatory Visit: Payer: BC Managed Care – PPO

## 2021-08-13 DIAGNOSIS — F329 Major depressive disorder, single episode, unspecified: Secondary | ICD-10-CM | POA: Diagnosis not present

## 2021-08-15 DIAGNOSIS — D259 Leiomyoma of uterus, unspecified: Secondary | ICD-10-CM | POA: Diagnosis not present

## 2021-08-18 ENCOUNTER — Other Ambulatory Visit (HOSPITAL_BASED_OUTPATIENT_CLINIC_OR_DEPARTMENT_OTHER): Payer: Self-pay

## 2021-08-21 ENCOUNTER — Telehealth: Payer: Self-pay | Admitting: Skilled Nursing Facility1

## 2021-08-21 NOTE — Telephone Encounter (Signed)
Returned pts call.   Pt had some questions about fluid.  Dietitian answered pts questions and offered a plethora of fluid options.

## 2021-08-25 DIAGNOSIS — D259 Leiomyoma of uterus, unspecified: Secondary | ICD-10-CM | POA: Diagnosis not present

## 2021-08-26 ENCOUNTER — Other Ambulatory Visit: Payer: BC Managed Care – PPO

## 2021-08-26 DIAGNOSIS — N924 Excessive bleeding in the premenopausal period: Secondary | ICD-10-CM | POA: Diagnosis not present

## 2021-08-26 DIAGNOSIS — D251 Intramural leiomyoma of uterus: Secondary | ICD-10-CM | POA: Diagnosis not present

## 2021-08-26 SURGERY — Surgical Case
Anesthesia: *Unknown

## 2021-08-27 ENCOUNTER — Encounter: Payer: BC Managed Care – PPO | Attending: Surgery | Admitting: Skilled Nursing Facility1

## 2021-08-27 ENCOUNTER — Other Ambulatory Visit: Payer: Self-pay

## 2021-08-27 DIAGNOSIS — Z6841 Body Mass Index (BMI) 40.0 and over, adult: Secondary | ICD-10-CM | POA: Diagnosis not present

## 2021-08-27 NOTE — Progress Notes (Signed)
Bariatric Nutrition Follow-Up Visit Medical Nutrition Therapy    NUTRITION ASSESSMENT    Surgery date: 07/15/2021 Surgery type: sleeve Start weight at [facility]: 331.7 pounds Weight today: 292.3 pounds  Body Composition Scale 07/29/2021 08/27/2021  Current Body Weight 307.2 292.3  Total Body Fat % 49.1 48.1  Visceral Fat 16 15  Fat-Free Mass % 50.8 51.8   Total Body Water % 39.9 40.4  Muscle-Mass lbs 33.2 33.1  BMI 54 51.4  Body Fat Displacement           Torso  lbs 93.8 87.4         Left Leg  lbs 18.7 17.4         Right Leg  lbs 18.7 17.4         Left Arm  lbs 9.3 8.7         Right Arm   lbs 9.3 8.7   Clinical  Medical hx: PCOS, diabetes Medications: see list Labs: A1C 6.2 Notable signs/symptoms:  Any previous deficiencies? vitamin D   Lifestyle & Dietary Hx  Pt states ground beef does not taste good.   Pt states she  is excited to start BELT.  Pt states she will be getting her fibroid taken out soon currently taking a medication that gives her symptoms of menopause.   Estimated daily fluid intake: 55+ oz Estimated daily protein intake: 60+ g Supplements: multi and calcium Current average weekly physical activity: ADL will start BELT   24-Hr Dietary Recall First Meal: egg + cheese Snack:  almonds Second Meal: shrimp or chipotle chicken Snack:  shrimp  Third Meal: shrimp or fish or chicken Snack:  Beverages: water, gatorade zero, water + flavoring, ocean spary 5 calorie juice   Post-Op Goals/ Signs/ Symptoms Using straws: no Drinking while eating: no Chewing/swallowing difficulties: no Changes in vision: no Changes to mood/headaches: no Hair loss/changes to skin/nails: no Difficulty focusing/concentrating: no Sweating: no Limb weakness: no Dizziness/lightheadedness: no Palpitations: no  Carbonated/caffeinated beverages: no N/V/D/C/Gas: no Abdominal pain: no Dumping syndrome: no    NUTRITION DIAGNOSIS  Overweight/obesity (Damar-3.3) related to past poor  dietary habits and physical inactivity as evidenced by completed bariatric surgery and following dietary guidelines for continued weight loss and healthy nutrition status.     NUTRITION INTERVENTION Nutrition counseling (C-1) and education (E-2) to facilitate bariatric surgery goals, including: Diet advancement to the next phase (phase 4) now including non satrchy vegetables The importance of consuming adequate calories as well as certain nutrients daily due to the body's need for essential vitamins, minerals, and fats The importance of daily physical activity and to reach a goal of at least 150 minutes of moderate to vigorous physical activity weekly (or as directed by their physician) due to benefits such as increased musculature and improved lab values The importance of intuitive eating specifically learning hunger-satiety cues and understanding the importance of learning a new body: The importance of mindful eating to avoid grazing behaviors   Goals: -Continue to aim for a minimum of 64 fluid ounces 7 days a week with at least 30 ounces being plain water  -Eat non-starchy vegetables 2 times a day 7 days a week  -Start out with soft cooked vegetables today and tomorrow; if tolerated begin to eat raw vegetables or cooked including salads  -Eat your 3 ounces of protein first then start in on your non-starchy vegetables; once you understand how much of your meal leads to satisfaction and not full while still eating 3 ounces of protein and  non-starchy vegetables you can eat them in any order   -Continue to aim for 30 minutes of activity at least 5 times a week  -Do NOT cook with/add to your food: alfredo sauce, cheese sauce, barbeque sauce, ketchup, fat back, butter, bacon grease, grease, Crisco, OR SUGAR   Handouts Provided Include  Phase 4  Learning Style & Readiness for Change Teaching method utilized: Visual & Auditory  Demonstrated degree of understanding via: Teach Back  Readiness  Level: Action Barriers to learning/adherence to lifestyle change: none identified   RD's Notes for Next Visit Assess adherence to pt chosen goals    MONITORING & EVALUATION Dietary intake, weekly physical activity, body weight  Next Steps Patient is to follow-up in 3-4 months

## 2021-09-04 DIAGNOSIS — F329 Major depressive disorder, single episode, unspecified: Secondary | ICD-10-CM | POA: Diagnosis not present

## 2021-09-08 DIAGNOSIS — N939 Abnormal uterine and vaginal bleeding, unspecified: Secondary | ICD-10-CM | POA: Diagnosis not present

## 2021-09-10 DIAGNOSIS — F329 Major depressive disorder, single episode, unspecified: Secondary | ICD-10-CM | POA: Diagnosis not present

## 2021-09-12 ENCOUNTER — Other Ambulatory Visit: Payer: Self-pay

## 2021-09-12 ENCOUNTER — Encounter (HOSPITAL_BASED_OUTPATIENT_CLINIC_OR_DEPARTMENT_OTHER): Payer: Self-pay | Admitting: Emergency Medicine

## 2021-09-12 ENCOUNTER — Emergency Department (HOSPITAL_BASED_OUTPATIENT_CLINIC_OR_DEPARTMENT_OTHER): Payer: BC Managed Care – PPO

## 2021-09-12 ENCOUNTER — Emergency Department (HOSPITAL_BASED_OUTPATIENT_CLINIC_OR_DEPARTMENT_OTHER)
Admission: EM | Admit: 2021-09-12 | Discharge: 2021-09-12 | Disposition: A | Discharge status: Home / Self Care | Payer: BC Managed Care – PPO | Attending: Emergency Medicine | Admitting: Emergency Medicine

## 2021-09-12 ENCOUNTER — Emergency Department (HOSPITAL_BASED_OUTPATIENT_CLINIC_OR_DEPARTMENT_OTHER): Payer: BC Managed Care – PPO | Admitting: Radiology

## 2021-09-12 DIAGNOSIS — Z7984 Long term (current) use of oral hypoglycemic drugs: Secondary | ICD-10-CM | POA: Insufficient documentation

## 2021-09-12 DIAGNOSIS — M25511 Pain in right shoulder: Secondary | ICD-10-CM | POA: Diagnosis not present

## 2021-09-12 DIAGNOSIS — Z79899 Other long term (current) drug therapy: Secondary | ICD-10-CM | POA: Insufficient documentation

## 2021-09-12 DIAGNOSIS — J45909 Unspecified asthma, uncomplicated: Secondary | ICD-10-CM | POA: Diagnosis not present

## 2021-09-12 DIAGNOSIS — R1011 Right upper quadrant pain: Secondary | ICD-10-CM | POA: Insufficient documentation

## 2021-09-12 DIAGNOSIS — R1013 Epigastric pain: Secondary | ICD-10-CM | POA: Diagnosis not present

## 2021-09-12 DIAGNOSIS — E119 Type 2 diabetes mellitus without complications: Secondary | ICD-10-CM | POA: Diagnosis not present

## 2021-09-12 DIAGNOSIS — E039 Hypothyroidism, unspecified: Secondary | ICD-10-CM | POA: Insufficient documentation

## 2021-09-12 LAB — URINALYSIS, ROUTINE W REFLEX MICROSCOPIC
Bilirubin Urine: NEGATIVE
Glucose, UA: NEGATIVE mg/dL
Hgb urine dipstick: NEGATIVE
Ketones, ur: NEGATIVE mg/dL
Leukocytes,Ua: NEGATIVE
Nitrite: NEGATIVE
Protein, ur: NEGATIVE mg/dL
Specific Gravity, Urine: 1.012 (ref 1.005–1.030)
pH: 7.5 (ref 5.0–8.0)

## 2021-09-12 LAB — CBC
HCT: 44.6 % (ref 36.0–46.0)
Hemoglobin: 13.8 g/dL (ref 12.0–15.0)
MCH: 25.9 pg — ABNORMAL LOW (ref 26.0–34.0)
MCHC: 30.9 g/dL (ref 30.0–36.0)
MCV: 83.7 fL (ref 80.0–100.0)
Platelets: 369 10*3/uL (ref 150–400)
RBC: 5.33 MIL/uL — ABNORMAL HIGH (ref 3.87–5.11)
RDW: 16.1 % — ABNORMAL HIGH (ref 11.5–15.5)
WBC: 9.2 10*3/uL (ref 4.0–10.5)
nRBC: 0 % (ref 0.0–0.2)

## 2021-09-12 LAB — COMPREHENSIVE METABOLIC PANEL
ALT: 100 U/L — ABNORMAL HIGH (ref 0–44)
AST: 167 U/L — ABNORMAL HIGH (ref 15–41)
Albumin: 4.8 g/dL (ref 3.5–5.0)
Alkaline Phosphatase: 199 U/L — ABNORMAL HIGH (ref 38–126)
Anion gap: 10 (ref 5–15)
BUN: 8 mg/dL (ref 6–20)
CO2: 26 mmol/L (ref 22–32)
Calcium: 10.5 mg/dL — ABNORMAL HIGH (ref 8.9–10.3)
Chloride: 103 mmol/L (ref 98–111)
Creatinine, Ser: 0.94 mg/dL (ref 0.44–1.00)
GFR, Estimated: >60 mL/min (ref >60)
Glucose, Bld: 89 mg/dL (ref 70–99)
Potassium: 3.7 mmol/L (ref 3.5–5.1)
Sodium: 139 mmol/L (ref 135–145)
Total Bilirubin: 1.1 mg/dL (ref 0.3–1.2)
Total Protein: 8.9 g/dL — ABNORMAL HIGH (ref 6.5–8.1)

## 2021-09-12 LAB — LIPASE, BLOOD: Lipase: 118 U/L — ABNORMAL HIGH (ref 11–51)

## 2021-09-12 LAB — TROPONIN I (HIGH SENSITIVITY): Troponin I (High Sensitivity): 6 ng/L (ref <18)

## 2021-09-12 LAB — PREGNANCY, URINE: Preg Test, Ur: NEGATIVE

## 2021-09-12 MED ORDER — ALUM & MAG HYDROXIDE-SIMETH 200-200-20 MG/5ML PO SUSP: 30.0000 mL | Freq: Once | ORAL | Status: DC

## 2021-09-12 MED ORDER — MORPHINE SULFATE (PF) 4 MG/ML IV SOLN: 4.0000 mg | Freq: Once | INTRAVENOUS | Status: DC

## 2021-09-12 MED ORDER — ONDANSETRON 4 MG PO TBDP: 4.0000 mg | ORAL_TABLET | Freq: Four times a day (QID) | ORAL | 0 refills | Status: DC | PRN

## 2021-09-12 MED ORDER — ONDANSETRON HCL 4 MG/2ML IJ SOLN: 4.0000 mg | Freq: Once | INTRAMUSCULAR | Status: DC

## 2021-09-12 NOTE — ED Provider Notes (Signed)
  Physical Exam  BP 130/75   Pulse 67   Temp 97.7 F (36.5 C)   Resp (!) 21   Ht 5\' 3"  (1.6 m)   Wt 131.1 kg   SpO2 100%   BMI 51.19 kg/m   Physical Exam  ED Course/Procedures     Procedures  MDM  Received patient in signout.  Epigastric pain to right upper quadrant.  LFTs mildly elevated.  Lipase also mildly elevated.  Has had recent gastric surgery.  Ultrasound done and unfortunate showed likely liver masses.  MRI recommended but CT also would be a less ideal option.  Discussed with Dr. Jobe Igo from radiology.  I have ordered an outpatient MRI that can hopefully be done soon.  He is thought that [facility] imaging may be the most expeditious site to get it done.  She will call to hopefully get it scheduled sooner.  We will discharge home.  Zofran refilled.  If pain becomes too much an issue may need to come back in for further treatment       Davonna Belling, MD 09/12/21 [phone]

## 2021-09-12 NOTE — ED Provider Notes (Signed)
[facility] DE[facility] Provider Note   CSN: [id number] Arrival date & time: 09/12/21  1207     History Chief Complaint  Patient presents with   Abdominal Pain    Christine Nelson is a 30 y.o. female.  30 yo F with a chief complaints of epigastric abdominal discomfort.  This is been going on for a few hours now.  The patient yesterday was fine and then this morning went to the gym got home lay down to take a nap and then woke up with some epigastric discomfort that radiated to the right shoulder blade.  She never had pain like this before.  Not sure what makes it better or worse.  Denies cough congestion or fever denies shortness of breath denies nausea or vomiting.  She had bariatric surgery done about 2 months ago.  Has had no issues with it.  The history is provided by the patient.  Abdominal Pain Pain location:  Epigastric Pain quality: aching and sharp   Pain radiates to:  R shoulder Pain severity:  Moderate Onset quality:  Gradual Duration:  2 hours Timing:  Constant Progression:  Unchanged Chronicity:  New Relieved by:  Nothing Worsened by:  Nothing Ineffective treatments:  None tried Associated symptoms: no chest pain, no chills, no dysuria, no fever, no nausea, no shortness of breath and no vomiting       Past Medical History:  Diagnosis Date   Asthma    with allergy season   Diabetes mellitus, type II (HCC)    Hgb A1c in 03/2019 was 6.8%   Fibroids    GERD (gastroesophageal reflux disease)    History of prediabetes    Hypothyroidism    Obesity    PCOS (polycystic ovarian syndrome)    Uterine leiomyoma    Vitamin D deficiency     Patient Active Problem List   Diagnosis Date Noted   Morbid obesity with BMI of 50.0-59.9, adult ([place]) 07/15/2021   Fibroid 08/24/2020   Iron deficiency anemia due to chronic blood loss 08/24/2020   Seasonal allergies 08/24/2020   History of hypothyroidism 03/15/2019   Class 3 severe obesity due to  excess calories without serious comorbidity with body mass index (BMI) of 50.0 to 59.9 in adult ([place]) 03/15/2019   Prediabetes 02/04/2018   Hypothyroidism 02/04/2018   Excessive bleeding in premenopausal period 02/04/2018    Past Surgical History:  Procedure Laterality Date   BIOPSY  05/09/2021   Procedure: BIOPSY;  Surgeon: Felicie Morn, MD;  Location: [facility];  Service: General;;   ESOPHAGOGASTRODUODENOSCOPY N/A 05/09/2021   Procedure: ESOPHAGOGASTRODUODENOSCOPY (EGD) with biopsies;  Surgeon: Felicie Morn, MD;  Location: [facility];  Service: General;  Laterality: N/A;   UPPER GI ENDOSCOPY N/A 07/15/2021   Procedure: UPPER GI ENDOSCOPY;  Surgeon: Felicie Morn, MD;  Location: [facility];  Service: General;  Laterality: N/A;   WISDOM TOOTH EXTRACTION  2011     OB History     Gravida  0   Para  0   Term  0   Preterm  0   AB  0   Living  0      SAB  0   IAB  0   Ectopic  0   Multiple  0   Live Births  0           Family History  Problem Relation Age of Onset   Hypertension Mother    Thyroid disease Father    Hypertension  Maternal Grandfather    Stroke Maternal Grandfather     Social History   Tobacco Use   Smoking status: Never   Smokeless tobacco: Never  Vaping Use   Vaping Use: Never used  Substance Use Topics   Alcohol use: Never   Drug use: Never    Home Medications Prior to Admission medications   Medication Sig Start Date End Date Taking? Authorizing Provider  albuterol (VENTOLIN HFA) 108 (90 Base) MCG/ACT inhaler Inhale 2 puffs into the lungs every 6 (six) hours as needed for shortness of breath or wheezing.    [provider]  ELDERBERRY PO Take 1 capsule by mouth 2 (two) times a week. Patient not taking: Reported on 07/11/2021    [provider]  gabapentin (NEURONTIN) 100 MG capsule Take 2 capsules (200 mg total) by mouth every 12 (twelve) hours. 07/15/21   Stechschulte, Nickola Major, MD   lansoprazole (PREVACID) 30 MG capsule Take 1 capsule (30 mg total) by mouth daily at 12 noon. Patient not taking: Reported on 07/11/2021 05/01/21   Billie Ruddy, MD  levocetirizine (XYZAL) 5 MG tablet Take 5 mg by mouth at bedtime. Patient not taking: Reported on 07/11/2021    [provider]  levothyroxine (SYNTHROID) 125 MCG tablet Take 125 mcg by mouth daily before breakfast. 01/08/20   [provider]  LUPRON DEPOT, 23-MONTH, 3.75 MG injection Inject 3.75 mg into the muscle every 28 (twenty-eight) days. Patient not taking: No sig reported 11/08/20   [provider]  Multiple Vitamins-Minerals (WOMENS MULTIVITAMIN PO) Take 1 tablet by mouth daily. Patient not taking: Reported on 07/11/2021    [provider]  ondansetron (ZOFRAN-ODT) 4 MG disintegrating tablet Dissolve 1 tablet by mouth every 6 (six) hours as needed for nausea or vomiting. 07/15/21   Stechschulte, Nickola Major, MD  oxyCODONE (OXY IR/ROXICODONE) 5 MG immediate release tablet Take 1 tablet (5 mg total) by mouth every 6 (six) hours as needed for severe pain. 07/15/21   Stechschulte, Nickola Major, MD  pantoprazole (PROTONIX) 40 MG tablet Take 1 tablet (40 mg total) by mouth daily. 07/15/21   Stechschulte, Nickola Major, MD  Prenatal Vit-Fe Fumarate-FA (PRENATAL MULTIVITAMIN) TABS tablet Take 1 tablet by mouth daily at 12 noon. Patient not taking: Reported on 07/11/2021    [provider]  Vitamin D, Ergocalciferol, (DRISDOL) 1.25 MG (50000 UNIT) CAPS capsule Take 1 capsule (50,000 Units total) by mouth every 7 (seven) days. Patient not taking: Reported on 07/11/2021 05/07/21   Billie Ruddy, MD    Allergies    Patient has no known allergies.  Review of Systems   Review of Systems  Constitutional:  Negative for chills and fever.  HENT:  Negative for congestion and rhinorrhea.   Eyes:  Negative for redness and visual disturbance.  Respiratory:  Negative for shortness of breath and wheezing.    Cardiovascular:  Negative for chest pain and palpitations.  Gastrointestinal:  Positive for abdominal pain. Negative for nausea and vomiting.  Genitourinary:  Negative for dysuria and urgency.  Musculoskeletal:  Negative for arthralgias and myalgias.  Skin:  Negative for pallor and wound.  Neurological:  Negative for dizziness and headaches.   Physical Exam Updated Vital Signs BP 131/77 (BP Location: Right Arm)   Pulse 83   Temp 97.7 F (36.5 C)   Resp 16   Ht 5\' 3"  (1.6 m)   Wt 131.1 kg   SpO2 99%   BMI 51.19 kg/m   Physical Exam Vitals and  nursing note reviewed.  Constitutional:      General: She is not in acute distress.    Appearance: She is well-developed. She is not diaphoretic.     Comments: BMI 52  HENT:     Head: Normocephalic and atraumatic.  Eyes:     Pupils: Pupils are equal, round, and reactive to light.  Cardiovascular:     Rate and Rhythm: Normal rate and regular rhythm.     Heart sounds: No murmur heard.   No friction rub. No gallop.  Pulmonary:     Effort: Pulmonary effort is normal.     Breath sounds: No wheezing or rales.  Abdominal:     General: There is no distension.     Palpations: Abdomen is soft.     Tenderness: There is abdominal tenderness.     Comments: Tenderness to the epigastrium and right upper quadrant.  Positive Murphy sign.  Musculoskeletal:        General: No tenderness.     Cervical back: Normal range of motion and neck supple.  Skin:    General: Skin is warm and dry.  Neurological:     Mental Status: She is alert and oriented to person, place, and time.  Psychiatric:        Behavior: Behavior normal.    ED Results / Procedures / Treatments   Labs (all labs ordered are listed, but only abnormal results are displayed) Labs Reviewed  CBC  COMPREHENSIVE METABOLIC PANEL  LIPASE, BLOOD  TROPONIN I (HIGH SENSITIVITY)    EKG EKG Interpretation  Date/Time:  Friday September 12 2021 12:21:20 EST Ventricular Rate:  79 PR  Interval:  120 QRS Duration: 80 QT Interval:  412 QTC Calculation: 472 R Axis:   24 Text Interpretation: Normal sinus rhythm T wave abnormality, consider inferior ischemia Prolonged QT Abnormal ECG No significant change since last tracing Confirmed by Deno Etienne [phone]) on 09/12/2021 12:44:57 PM  Radiology No results found.  Procedures Procedures   Medications Ordered in ED Medications  morphine 4 MG/ML injection 4 mg (has no administration in time range)  ondansetron (ZOFRAN) injection 4 mg (has no administration in time range)  alum & mag hydroxide-simeth (MAALOX/MYLANTA) 200-200-20 MG/5ML suspension 30 mL (has no administration in time range)    ED Course  I have reviewed the triage vital signs and the nursing notes.  Pertinent labs & imaging results that were available during my care of the patient were reviewed by me and considered in my medical decision making (see chart for details).    MDM Rules/Calculators/A&P                           30 yO F with a significant past medical history of bariatric surgery performed a couple months ago comes in with a chief complaints of epigastric abdominal pain that radiates to the right shoulder.  This happened while at rest.  She has some discomfort to the epigastrium and right upper quadrant on exam.  Possibly gallbladder pathology.  We will obtain laboratory evaluation and treat pain and nausea GI cocktail reassess.  Patient is feeling much better on reassessment.  She does have LFT elevation that is new for her.  Will order right upper quadrant ultrasound.  Signed out to Dr. Alvino Chapel, please see their note for further details of care in the ED.  The patients results and plan were reviewed and discussed.   Any x-rays performed were independently reviewed by myself.  Differential diagnosis were considered with the presenting HPI.  Medications - No data to display  Vitals:   09/12/21 1216 09/12/21 1326 09/12/21 1445 09/12/21  1640  BP: 131/77 124/63 130/75 123/77  Pulse: 83 76 67 77  Resp: 16 18 (!) 21 18  Temp: 97.7 F (36.5 C)   98.7 F (37.1 C)  TempSrc:    Oral  SpO2: 99% 100% 100% 100%  Weight:      Height:        Final diagnoses:  RUQ abdominal pain   Final Clinical Impression(s) / ED Diagnoses Final diagnoses:  None    Rx / DC Orders ED Discharge Orders     None        Deno Etienne, DO 09/13/21 0708

## 2021-09-12 NOTE — Discharge Instructions (Addendum)
Follow-up for the MRI.  Your primary care doctor can help follow the results.  You can call [facility] to help schedule it.  I discussed with Dr. Jobe Igo who thought it should be done ideally in the next few days

## 2021-09-12 NOTE — ED Notes (Signed)
Pt stated that she felt better and that she was hungry. Pt was advised to wait on eating until her [place] resulted

## 2021-09-12 NOTE — ED Triage Notes (Signed)
Pt arrives pov with c/o epigastric pain that started this morning. Pt denies N/V, treated by EMS at home, referred by EMS. Bariatric surgery 10/11

## 2021-09-15 ENCOUNTER — Encounter: Payer: Self-pay | Admitting: Family Medicine

## 2021-09-15 ENCOUNTER — Ambulatory Visit (INDEPENDENT_AMBULATORY_CARE_PROVIDER_SITE_OTHER): Payer: BC Managed Care – PPO | Admitting: Family Medicine

## 2021-09-15 VITALS — BP 104/72 | HR 70 | Temp 97.7°F | Wt 287.2 lb

## 2021-09-15 DIAGNOSIS — Z9889 Other specified postprocedural states: Secondary | ICD-10-CM | POA: Diagnosis not present

## 2021-09-15 DIAGNOSIS — R160 Hepatomegaly, not elsewhere classified: Secondary | ICD-10-CM

## 2021-09-15 DIAGNOSIS — R1013 Epigastric pain: Secondary | ICD-10-CM

## 2021-09-15 DIAGNOSIS — D219 Benign neoplasm of connective and other soft tissue, unspecified: Secondary | ICD-10-CM | POA: Diagnosis not present

## 2021-09-15 DIAGNOSIS — F329 Major depressive disorder, single episode, unspecified: Secondary | ICD-10-CM | POA: Diagnosis not present

## 2021-09-15 NOTE — Progress Notes (Signed)
Subjective:    Patient ID: Christine Nelson, female    DOB: 1990/10/14, 30 y.o.   MRN: [id number]  Chief Complaint  Patient presents with   liver    Masses on liver, went to ED and MRI showed masses     HPI Patient was seen today for follow-up.  Pt s/p bariatric surgery 2 months ago.  Patient seen in ED on 09/12/2021 for epigastric discomfort.  LFT elevation noted.  RUQ u/s with solid masses.  MRI scheduled as outpatient for 09/30/21.  Pt no longer having epigastric pain.  Has a f/u appt with her surgeon next wk.    Pt still considering medical school vs nursing school.  Past Medical History:  Diagnosis Date   Asthma    with allergy season   Diabetes mellitus, type II ([place])    Hgb A1c in 03/2019 was 6.8%   Fibroids    GERD (gastroesophageal reflux disease)    History of prediabetes    Hypothyroidism    Obesity    PCOS (polycystic ovarian syndrome)    Uterine leiomyoma    Vitamin D deficiency     No Known Allergies  ROS General: Denies fever, chills, night sweats, changes in weight, changes in appetite HEENT: Denies headaches, ear pain, changes in vision, rhinorrhea, sore throat CV: Denies CP, palpitations, SOB, orthopnea Pulm: Denies SOB, cough, wheezing GI: Denies abdominal pain, nausea, vomiting, diarrhea, constipation GU: Denies dysuria, hematuria, frequency, vaginal discharge Msk: Denies muscle cramps, joint pains Neuro: Denies weakness, numbness, tingling Skin: Denies rashes, bruising Psych: Denies depression, anxiety, hallucinations    Objective:    Blood pressure 104/72, pulse 70, temperature 97.7 F (36.5 C), temperature source Oral, weight 287 lb 3.2 oz (130.3 kg), SpO2 99 %.  Gen. Pleasant, well-nourished, in no distress, normal affect   HEENT: [place]/AT, face symmetric, conjunctiva clear, no scleral icterus, PERRLA, EOMI, nares patent without drainage Lungs: no accessory muscle use, CTAB, no wheezes or rales Cardiovascular: RRR, no m/r/g, no peripheral  edema Abdomen: BS present, soft, NT/ND, no hepatosplenomegaly. Musculoskeletal: No deformities, no cyanosis or clubbing, normal tone Neuro:  A&Ox3, CN II-XII intact, normal gait Skin:  Warm, no lesions/ rash   Wt Readings from Last 3 Encounters:  09/15/21 287 lb 3.2 oz (130.3 kg)  09/12/21 289 lb (131.1 kg)  08/27/21 292 lb 4.8 oz (132.6 kg)    Lab Results  Component Value Date   WBC 9.2 09/12/2021   HGB 13.8 09/12/2021   HCT 44.6 09/12/2021   PLT 369 09/12/2021   GLUCOSE 89 09/12/2021   CHOL 153 05/01/2021   TRIG 62.0 05/01/2021   HDL 41.10 05/01/2021   LDLCALC 99 05/01/2021   ALT 100 (H) 09/12/2021   AST 167 (H) 09/12/2021   NA 139 09/12/2021   K 3.7 09/12/2021   CL 103 09/12/2021   CREATININE 0.94 09/12/2021   BUN 8 09/12/2021   CO2 26 09/12/2021   TSH 11.64 (H) 05/01/2021   HGBA1C 6.2 (A) 07/02/2021    Assessment/Plan:  Liver masses -incidental finding on RUQ u/s 09/12/21.  Diffuse heterogenous hepatic echotexture.  Areas of hypoechogenicity are suspicious for masses.  Posterior right hepatic lobe 6.5 x 6.9 x 7.5 cm.  Central right liver lobe mass 2.6 x 2.9 x 2.4 cm. -Patient advised to keep upcoming appointment for MRI with and without contrast.  Tentatively scheduled for 09/30/21.  Patient can contact points for imaging cancellations imaging done sooner. -Further recommendations based on results.  Status post gastric surgery -Stable  S/p sleeve gastrectomy 07/15/21 -Continue follow-up with gastric surgeon  Fibroid -Stable -MRI pelvis w w/o contrast 12/27/20 with 10.6 cm transmural fibroid centered in the posterior uterine body, stable versus mildly decreased. -Continue follow-up with OB/GYN.  Epigastric pain -resolved -possibly 2/2 incidental findings of liver masses,   F/u prn  Grier Mitts, MD

## 2021-09-17 ENCOUNTER — Telehealth: Payer: Self-pay | Admitting: Family Medicine

## 2021-09-17 NOTE — Telephone Encounter (Signed)
Patient is still waiting to get her MRI.  She states her insurance co. Christine Nelson that Dr. Volanda Napoleon could call this number and authorize it to be done--  [phone]  [facility]

## 2021-09-23 ENCOUNTER — Ambulatory Visit: Payer: BC Managed Care – PPO | Admitting: Skilled Nursing Facility1

## 2021-09-24 DIAGNOSIS — D259 Leiomyoma of uterus, unspecified: Secondary | ICD-10-CM | POA: Diagnosis not present

## 2021-09-25 DIAGNOSIS — R109 Unspecified abdominal pain: Secondary | ICD-10-CM | POA: Diagnosis not present

## 2021-09-29 ENCOUNTER — Other Ambulatory Visit: Payer: BC Managed Care – PPO

## 2021-09-30 ENCOUNTER — Other Ambulatory Visit: Payer: BC Managed Care – PPO

## 2021-10-01 ENCOUNTER — Ambulatory Visit (INDEPENDENT_AMBULATORY_CARE_PROVIDER_SITE_OTHER): Payer: BC Managed Care – PPO | Admitting: Internal Medicine

## 2021-10-01 ENCOUNTER — Encounter: Payer: Self-pay | Admitting: Internal Medicine

## 2021-10-01 ENCOUNTER — Other Ambulatory Visit: Payer: Self-pay

## 2021-10-01 VITALS — BP 128/80 | HR 66 | Ht 63.0 in | Wt 284.6 lb

## 2021-10-01 DIAGNOSIS — E1165 Type 2 diabetes mellitus with hyperglycemia: Secondary | ICD-10-CM

## 2021-10-01 DIAGNOSIS — E039 Hypothyroidism, unspecified: Secondary | ICD-10-CM | POA: Diagnosis not present

## 2021-10-01 LAB — POCT GLYCOSYLATED HEMOGLOBIN (HGB A1C): Hemoglobin A1C: 5.3 % (ref 4.0–5.6)

## 2021-10-01 LAB — TSH: TSH: 0.9 u[IU]/mL (ref 0.35–5.50)

## 2021-10-01 LAB — T4, FREE: Free T4: 1.23 ng/dL (ref 0.60–1.60)

## 2021-10-01 NOTE — Patient Instructions (Addendum)
Please stop at the lab.  Please continue Levothyroxine 125 mcg daily.  Take the thyroid hormone EVERY DAY, with water, at least 30 minutes before breakfast, separated by at least 4 hours from: - acid reflux medications - calcium - iron - multivitamins  Please come back for a follow-up appointment in 6 months.

## 2021-10-01 NOTE — Progress Notes (Signed)
Patient ID: Christine Nelson, female   DOB: December 11, 1990, 30 y.o.   MRN: [id number]   This visit occurred during the SARS-CoV-2 public health emergency.  Safety protocols were in place, including screening questions prior to the visit, additional usage of staff PPE, and extensive cleaning of exam room while observing appropriate contact time as indicated for disinfecting solutions.   HPI  Christine Nelson is a 30 y.o. female, initially referred by her ObGyn , Dr. Anderson Malta A. Mock, returning for follow-up for hypothyroidism and type 2 diabetes, controlled, non-insulin-dependent, without long-term complications.  Last appointment 3 months ago.  Interim history: No increased urination, blurry vision, nausea, chest pain. Since last visit, she had gastric sleeve surgery on 07/15/2021.  She lost 49 lbs in last 2 months. She presented to the emergency room 09/12/2021 with abdominal pain and was found to have a high lipase, 118, and also increased AST/ALT (167/100) and high alkaline phosphatase. She was found to have 2 masses on the liver on RUQ U/S -reviewing the report: Heterogeneous hepatic echotexture, favored to be secondary to multiple solid masses. Considerations include metastatic disease or hepatocellular carcinoma/carcinomas. Further evaluation was recommended and she will have an MRI. She was supposed to have fibroids surgery, but this was postponed to 11/2021. Now back on Lupron. The fibroid decreased in size from 11 to 8 cm.  Hypothyroidism  - dx'ed in 2013 - She was on levothyroxine 150 mcg in the past but stopped in 2018 >> restarted in 2020 at a lower dose  She is on levothyroxine 125 mcg daily: - in am - fasting - at least 30 min from b'fast - + calcium - 6h later - no iron - + multivitamins- 4h later - stopped PPIs   - not on Biotin  Reviewed her TFTs: Lab Results  Component Value Date   TSH 11.64 (H) 05/01/2021   TSH 5.46 (H) 08/16/2020   TSH 3.03 04/11/2020   TSH  13.68 (H) 03/15/2019   TSH 6.960 (H) 02/04/2018   Lab Results  Component Value Date   FREET4 1.2 08/16/2020   FREET4 1.15 04/11/2020   FREET4 0.69 03/15/2019   FREET4 1.09 02/04/2018   Lab Results  Component Value Date   T3FREE 2.7 02/04/2018  05/03/2015: TSH 0.743, free T4 1.68  Pt denies: - feeling nodules in neck - hoarseness - dysphagia - choking - SOB with lying down  She has + FH of thyroid disorders in: Father. No FH of thyroid cancer. No h/o radiation tx to head or neck.  No herbal supplements. No Biotin use. No recent steroids use.   Type 2 diabetes: -Previously on Metformin  Reviewed HbA1c level: Lab Results  Component Value Date   HGBA1C 6.2 (A) 07/02/2021   HGBA1C 7.0 (H) 05/01/2021   HGBA1C 6.2 (A) 11/21/2020   HGBA1C 6.5 04/11/2020   HGBA1C 6.8 (H) 03/15/2019   HGBA1C 5.5 02/04/2018   No CKD: Lab Results  Component Value Date   BUN 8 09/12/2021   BUN 14 07/08/2021   Lab Results  Component Value Date   CREATININE 0.94 09/12/2021   CREATININE 0.92 07/15/2021   No HL: Lab Results  Component Value Date   CHOL 153 05/01/2021   HDL 41.10 05/01/2021   LDLCALC 99 05/01/2021   TRIG 62.0 05/01/2021   CHOLHDL 4 05/01/2021   She sees OB/GYN for PCOS, uterine fibroid (now on Lupron).  Previously on OCPs but wanted to come off OCPs as she feels that they are hindering her  weight loss. Sees Dr Mearl Latin.    ROS: + see HPI  I reviewed pt's medications, allergies, PMH, social hx, family hx, and changes were documented in the history of present illness. Otherwise, unchanged from my initial visit note.  Past Medical History:  Diagnosis Date   Asthma    with allergy season   Diabetes mellitus, type II (HCC)    Hgb A1c in 03/2019 was 6.8%   Fibroids    GERD (gastroesophageal reflux disease)    History of prediabetes    Hypothyroidism    Obesity    PCOS (polycystic ovarian syndrome)    Uterine leiomyoma    Vitamin D deficiency    Past Surgical  History:  Procedure Laterality Date   BIOPSY  05/09/2021   Procedure: BIOPSY;  Surgeon: Felicie Morn, MD;  Location: [facility];  Service: General;;   ESOPHAGOGASTRODUODENOSCOPY N/A 05/09/2021   Procedure: ESOPHAGOGASTRODUODENOSCOPY (EGD) with biopsies;  Surgeon: Felicie Morn, MD;  Location: [facility];  Service: General;  Laterality: N/A;   UPPER GI ENDOSCOPY N/A 07/15/2021   Procedure: UPPER GI ENDOSCOPY;  Surgeon: Felicie Morn, MD;  Location: [facility];  Service: General;  Laterality: N/A;   WISDOM TOOTH EXTRACTION  2011   Social History   Socioeconomic History   Marital status: Married    Spouse name: Manufacturing engineer   Number of children: Not on file   Years of education: Not on file   Highest education level: Not on file  Occupational History   Occupation: Ship broker   Occupation: Administrative Asst for [employer]  Tobacco Use   Smoking status: Never   Smokeless tobacco: Never  Vaping Use   Vaping Use: Never used  Substance and Sexual Activity   Alcohol use: Never   Drug use: Never   Sexual activity: Yes  Other Topics Concern   Not on file  Social History Narrative   Pt is single.  She has a boyfriend.  Pt graduated college with a degree in Biology.  Pt has always wanted to become a physician.  Pt was teaching Biology.  She is now working part-time at [employer].         Social Determinants of Health   Financial Resource Strain: Not on file  Food Insecurity: Not on file  Transportation Needs: Not on file  Physical Activity: Not on file  Stress: Not on file  Social Connections: Not on file  Intimate Partner Violence: Not on file   Current Outpatient Medications on File Prior to Visit  Medication Sig Dispense Refill   albuterol (VENTOLIN HFA) 108 (90 Base) MCG/ACT inhaler Inhale 2 puffs into the lungs every 6 (six) hours as needed for shortness of breath or wheezing. (Patient not taking: Reported on 09/15/2021)     ELDERBERRY PO Take 1  capsule by mouth 2 (two) times a week. (Patient not taking: Reported on 07/11/2021)     gabapentin (NEURONTIN) 100 MG capsule Take 2 capsules (200 mg total) by mouth every 12 (twelve) hours. (Patient not taking: Reported on 09/15/2021) 20 capsule 0   lansoprazole (PREVACID) 30 MG capsule Take 1 capsule (30 mg total) by mouth daily at 12 noon. (Patient not taking: Reported on 07/11/2021) 90 capsule 1   levocetirizine (XYZAL) 5 MG tablet Take 5 mg by mouth at bedtime. (Patient not taking: Reported on 07/11/2021)     levothyroxine (SYNTHROID) 125 MCG tablet Take 125 mcg by mouth daily before breakfast.     LUPRON DEPOT, 21-MONTH, 3.75 MG injection  Inject 3.75 mg into the muscle every 28 (twenty-eight) days.     Multiple Vitamins-Minerals (WOMENS MULTIVITAMIN PO) Take 1 tablet by mouth daily.     ondansetron (ZOFRAN-ODT) 4 MG disintegrating tablet Dissolve 1 tablet by mouth every 6 (six) hours as needed for nausea or vomiting. (Patient not taking: Reported on 09/15/2021) 20 tablet 0   oxyCODONE (OXY IR/ROXICODONE) 5 MG immediate release tablet Take 1 tablet (5 mg total) by mouth every 6 (six) hours as needed for severe pain. (Patient not taking: Reported on 09/15/2021) 10 tablet 0   pantoprazole (PROTONIX) 40 MG tablet Take 1 tablet (40 mg total) by mouth daily. (Patient not taking: Reported on 09/15/2021) 90 tablet 0   Prenatal Vit-Fe Fumarate-FA (PRENATAL MULTIVITAMIN) TABS tablet Take 1 tablet by mouth daily at 12 noon. (Patient not taking: Reported on 07/11/2021)     Vitamin D, Ergocalciferol, (DRISDOL) 1.25 MG (50000 UNIT) CAPS capsule Take 1 capsule (50,000 Units total) by mouth every 7 (seven) days. (Patient not taking: Reported on 07/11/2021) 12 capsule 0   No current facility-administered medications on file prior to visit.   No Known Allergies Family History  Problem Relation Age of Onset   Hypertension Mother    Thyroid disease Father    Hypertension Maternal Grandfather    Stroke Maternal  Grandfather   Pertinent family history: See HPI  PE: BP 128/80 (BP Location: Right Arm, Patient Position: Sitting, Cuff Size: Normal)    Pulse 66    Ht 5\' 3"  (1.6 m)    Wt 284 lb 9.6 oz (129.1 kg)    SpO2 97%    BMI 50.41 kg/m  Wt Readings from Last 3 Encounters:  10/01/21 284 lb 9.6 oz (129.1 kg)  09/15/21 287 lb 3.2 oz (130.3 kg)  09/12/21 289 lb (131.1 kg)   Constitutional: overweight, in NAD Eyes: PERRLA, EOMI, no exophthalmos ENT: moist mucous membranes, no thyromegaly, no cervical lymphadenopathy Cardiovascular: RRR, No MRG Respiratory: CTA B Musculoskeletal: no deformities, strength intact in all 4 Skin: moist, warm, no rashes Neurological: no tremor with outstretched hands, DTR normal in all 4  ASSESSMENT: 1. Hypothyroidism  2.  Type 2 diabetes, non-insulin-dependent  PLAN:  1. Patient with longstanding, uncontrolled, hypothyroidism, due to noncompliance with levothyroxine.  She was missing doses in the past and we discussed about taking it every morning and put it on the nightstand. -Her TPO and ATA antibodies were not elevated -At last visit, we reviewed her previous TSH which was elevated: Lab Results  Component Value Date   TSH 11.64 (H) 05/01/2021  - she was on the 125 mcg levothyroxine dose.  She mentions that she was missing doses before the above TSH returned.  I advised her that she can take 2 tablets in the next day if she misses levothyroxine.  Since last visit, she denies missing doses. -She feels good on this dose - we discussed about taking the thyroid hormone every day, with water, >30 minutes before breakfast, separated by >4 hours from acid reflux medications, calcium, iron, multivitamins. Pt. is taking it correctly. - will check thyroid tests today: TSH and fT4 - If labs are abnormal, she will need to return for repeat TFTs in 1.5 months -Otherwise, I will see her back in 6 mo  2.  Type 2 diabetes -Visit, HbA1c was stable, at 6.2%. -Since last visit,  she had sleeve gastrectomy on 07/15/2021 -we discussed that diabetes frequently remits after this -She lost almost 50 pounds in 2 months after the  surgery. -She continues to see nutrition -She is not on medication for her diabetes -At today's visit HbA1c is 5.3% (lower).  Component     Latest Ref Rng & Units 10/01/2021  TSH     0.35 - 5.50 uIU/mL 0.90  T4,Free(Direct)     0.60 - 1.60 ng/dL 1.23  Hemoglobin A1C     4.0 - 5.6 % 5.3  TFTs are normal.  Philemon Kingdom, MD PhD [facility]

## 2021-10-08 DIAGNOSIS — F329 Major depressive disorder, single episode, unspecified: Secondary | ICD-10-CM | POA: Diagnosis not present

## 2021-10-10 ENCOUNTER — Telehealth: Payer: Self-pay

## 2021-10-10 NOTE — Telephone Encounter (Signed)
Caller states she was advised to go to urgent care. She would like to make sure if it is okay to have a shot given Toroidal for severe stomach pain.  --Caller states she calles the office today about upper abd pain she is having that she thinks is from 2 masses she has on her liver. States she was advised to go to urgent care by the office. States the provider at [facility] wanted her to call her physician and ask if she can have a Toradol injection for this pain. Caller states she takes Levothyroxine and Lupron injections to reduce the size of the masses and the UC provider is aware. Denies any new or worse symptoms she has not just discussed with the UC provider. Wants the on call provider paged.  09/25/2021 5:44:46 PM Clinical Call Cabe, RN, Joelene Millin    Comments Caller aware Dr. Nicki Reaper is calling the UC provider.  Einar Pheasant - MD 09/25/2021 5:40:42 PM UC provider's name, Dr. Malachy Mood, and her phone number, [phone], given to Dr. Nicki Reaper.

## 2021-10-13 ENCOUNTER — Ambulatory Visit: Payer: Self-pay

## 2021-10-13 ENCOUNTER — Emergency Department (HOSPITAL_COMMUNITY)
Admission: EM | Admit: 2021-10-13 | Discharge: 2021-10-14 | Disposition: A | Discharge status: Home / Self Care | Payer: BC Managed Care – PPO | Attending: Emergency Medicine | Admitting: Emergency Medicine

## 2021-10-13 ENCOUNTER — Telehealth: Payer: Self-pay | Admitting: Family Medicine

## 2021-10-13 ENCOUNTER — Encounter (HOSPITAL_COMMUNITY): Payer: Self-pay

## 2021-10-13 ENCOUNTER — Other Ambulatory Visit: Payer: Self-pay

## 2021-10-13 DIAGNOSIS — M25511 Pain in right shoulder: Secondary | ICD-10-CM | POA: Insufficient documentation

## 2021-10-13 DIAGNOSIS — K769 Liver disease, unspecified: Secondary | ICD-10-CM

## 2021-10-13 DIAGNOSIS — E039 Hypothyroidism, unspecified: Secondary | ICD-10-CM | POA: Insufficient documentation

## 2021-10-13 DIAGNOSIS — R160 Hepatomegaly, not elsewhere classified: Secondary | ICD-10-CM | POA: Diagnosis not present

## 2021-10-13 DIAGNOSIS — Z79899 Other long term (current) drug therapy: Secondary | ICD-10-CM | POA: Diagnosis not present

## 2021-10-13 DIAGNOSIS — R1011 Right upper quadrant pain: Secondary | ICD-10-CM | POA: Insufficient documentation

## 2021-10-13 DIAGNOSIS — R001 Bradycardia, unspecified: Secondary | ICD-10-CM | POA: Diagnosis not present

## 2021-10-13 LAB — COMPREHENSIVE METABOLIC PANEL
ALT: 35 U/L (ref 0–44)
AST: 40 U/L (ref 15–41)
Albumin: 3.8 g/dL (ref 3.5–5.0)
Alkaline Phosphatase: 100 U/L (ref 38–126)
Anion gap: 8 (ref 5–15)
BUN: 18 mg/dL (ref 6–20)
CO2: 25 mmol/L (ref 22–32)
Calcium: 9.4 mg/dL (ref 8.9–10.3)
Chloride: 106 mmol/L (ref 98–111)
Creatinine, Ser: 0.82 mg/dL (ref 0.44–1.00)
GFR, Estimated: >60 mL/min (ref >60)
Glucose, Bld: 79 mg/dL (ref 70–99)
Potassium: 3.9 mmol/L (ref 3.5–5.1)
Sodium: 139 mmol/L (ref 135–145)
Total Bilirubin: 0.6 mg/dL (ref 0.3–1.2)
Total Protein: 7.2 g/dL (ref 6.5–8.1)

## 2021-10-13 LAB — I-STAT BETA HCG BLOOD, ED (MC, WL, AP ONLY): I-stat hCG, quantitative: 5.6 m[IU]/mL — ABNORMAL HIGH (ref <5)

## 2021-10-13 LAB — CBC
HCT: 38.8 % (ref 36.0–46.0)
Hemoglobin: 12.1 g/dL (ref 12.0–15.0)
MCH: 26.8 pg (ref 26.0–34.0)
MCHC: 31.2 g/dL (ref 30.0–36.0)
MCV: 85.8 fL (ref 80.0–100.0)
Platelets: 342 10*3/uL (ref 150–400)
RBC: 4.52 MIL/uL (ref 3.87–5.11)
RDW: 15.3 % (ref 11.5–15.5)
WBC: 8.9 10*3/uL (ref 4.0–10.5)
nRBC: 0 % (ref 0.0–0.2)

## 2021-10-13 LAB — LIPASE, BLOOD: Lipase: 54 U/L — ABNORMAL HIGH (ref 11–51)

## 2021-10-13 NOTE — ED Triage Notes (Signed)
Patient c/o epigastric pain and states she had a gastric bypas 10/22. Patient denies any N/V/D. Patient also c/o right shoulder and states it correlates with her abdominal pain

## 2021-10-13 NOTE — Telephone Encounter (Signed)
°  Chief Complaint: abdominal pain Symptoms: abdominal pain Frequency: 2 hours Pertinent Negatives: NA Disposition: [x] ED /[] Urgent Care (no appt availability in office) / [] Appointment(In office/virtual)/ []  [place] [facility]/ [] Home Care/ [] Refused Recommended Disposition /[] [place] [facility]/ []  Follow-up with PCP Additional Notes: Pt's biggest concern was wanting and emergent MRI done. She has contacted PCP and surgeon from Gastric bypass sx but no one has gotten back with her. Advised her she can go to ED to get MRI done but I was unable to place orders and also advised her to reach back out to PCP. Pt verbalized understanding.    Reason for Disposition  [1] SEVERE pain (e.g., excruciating) AND [2] present > 1 hour  Answer Assessment - Initial Assessment Questions 1. LOCATION: "Where does it hurt?"  epigastric 2. RADIATION: "Does the pain shoot anywhere else?" (e.g., chest, back)  no 3. ONSET: "When did the pain begin?" (e.g., minutes, hours or days ago)  1.5-2hrs 4. SEVERITY: "How bad is the pain?"  (e.g., Scale 1-10; mild, moderate, or severe)    - MILD (1-3): doesn't interfere with normal activities, abdomen soft and not tender to touch    - MODERATE (4-7): interferes with normal activities or awakens from sleep, abdomen tender to touch    - SEVERE (8-10): excruciating pain, doubled over, unable to do any normal activities  severe 5. OTHER SYMPTOMS: "Do you have any other symptoms?" (e.g., back pain, diarrhea, fever, urination pain, vomiting)  No  Protocols used: Abdominal Pain - Female-A-AH

## 2021-10-13 NOTE — Telephone Encounter (Signed)
Pt was sch for MRI of liver in dec at [facility] and  due to machine malfunction she was rsc to 10-16-2021. PT would like to know if md could get her sooner MRI. Pt is also have a little right shoulder pain. Pt is taking hydrocodone -acetaminophen 1 tablet by mouth every 6 hrs as needed as for the pain. Pt states hydrocodone is not really working . Please advise

## 2021-10-13 NOTE — Telephone Encounter (Signed)
Pt is calling back and after  goggling and her symptoms maybe related to gallbladder . Pt had weight loss surgery in oct 2022. Pt also would like to know if MRI will show gallbladder and pancreas. Pt thinks the shoulder pain is related to her gallbladder. Pt just took one of hydrocodone about 20 minutes ago  and it is help with abd pain

## 2021-10-13 NOTE — ED Provider Triage Note (Signed)
Emergency Medicine Provider Triage Evaluation Note  Christine Nelson , a 31 y.o. female  was evaluated in triage.  Pt complains of abdominal pain a few week duration.  Patient reports she has referred pain to her right shoulder..  Denies nausea, vomiting, fever, or chills.  Reports pain is severe during the episodes.  Pain is episodic.  She was evaluated in the emergency room on 12/9 underwent right upper quadrant ultrasound which showed liver mass and MRI was recommended which was ordered for an outpatient basis.  She reports initial appointment was canceled and now she has a rescheduled for 1/12.  She was also seen in the urgent care since that emergency room visit and was given Percocet which she took today with moderate improvement in her pain.  Review of Systems  Positive: As above Negative: As above  Physical Exam  BP 125/85 (BP Location: Left Arm)    Pulse 70    Temp 98 F (36.7 C) (Oral)    Resp 18    Ht 5\' 3"  (1.6 m)    Wt 126.6 kg    SpO2 100%    BMI 49.42 kg/m  Gen:   Awake, no distress   Resp:  Normal effort  MSK:   Moves extremities without difficulty  Other:  Abdomen without tenderness to palpation.  Medical Decision Making  Medically screening exam initiated at 5:30 PM.  Appropriate orders placed.  Christine Nelson was informed that the remainder of the evaluation will be completed by another provider, this initial triage assessment does not replace that evaluation, and the importance of remaining in the ED until their evaluation is complete.     Evlyn Courier, PA-C 10/13/21 1732

## 2021-10-14 ENCOUNTER — Emergency Department (HOSPITAL_COMMUNITY): Payer: BC Managed Care – PPO

## 2021-10-14 ENCOUNTER — Encounter (HOSPITAL_COMMUNITY): Payer: Self-pay | Admitting: Emergency Medicine

## 2021-10-14 DIAGNOSIS — R160 Hepatomegaly, not elsewhere classified: Secondary | ICD-10-CM | POA: Diagnosis not present

## 2021-10-14 LAB — PREGNANCY, URINE: Preg Test, Ur: NEGATIVE

## 2021-10-14 MED ORDER — ALUM & MAG HYDROXIDE-SIMETH 200-200-20 MG/5ML PO SUSP
30.0000 mL | Freq: Once | ORAL | Status: AC
  Administered 2021-10-14: 30 mL via ORAL
  Filled 2021-10-14: qty 30

## 2021-10-14 MED ORDER — FAMOTIDINE IN NACL 20-0.9 MG/50ML-% IV SOLN
20.0000 mg | Freq: Once | INTRAVENOUS | Status: AC
  Administered 2021-10-14: 20 mg via INTRAVENOUS
  Filled 2021-10-14: qty 50

## 2021-10-14 MED ORDER — SODIUM CHLORIDE (PF) 0.9 % IJ SOLN
INTRAMUSCULAR | Status: AC
  Filled 2021-10-14: qty 50

## 2021-10-14 MED ORDER — IOHEXOL 350 MG/ML SOLN
80.0000 mL | Freq: Once | INTRAVENOUS | Status: AC | PRN
  Administered 2021-10-14: 80 mL via INTRAVENOUS

## 2021-10-14 MED ORDER — SODIUM CHLORIDE 0.9 % IV BOLUS
1000.0000 mL | Freq: Once | INTRAVENOUS | Status: AC
  Administered 2021-10-14: 1000 mL via INTRAVENOUS

## 2021-10-14 MED ORDER — LIDOCAINE VISCOUS HCL 2 % MT SOLN
15.0000 mL | Freq: Once | OROMUCOSAL | Status: AC
  Administered 2021-10-14: 15 mL via ORAL
  Filled 2021-10-14: qty 15

## 2021-10-14 NOTE — ED Provider Notes (Signed)
[facility] DEPT Provider Note   CSN: [id number] Arrival date & time: 10/13/21  1637     History  Chief Complaint  Patient presents with   Abdominal Pain   Shoulder Pain    Christine Nelson is a 31 y.o. female.  This is a 31 y.o. female with significant medical history as below, including prior gastric sleeve surgery, obesity who presents to the ED with complaint of abdominal pain, right shoulder pain.  Pain has been ongoing for approximately 1 month.  She is evaluated emergency department received ultrasound which was abnormal, concern for possible liver lesions.  Patient scheduled for outpatient MRI in the next few days.  Has been having ongoing pain associated with her right upper quadrant region with some pain in her right shoulder.  Pain has been intermittent, no clear exacerbating factors.  Symptoms relieved with oral Percocet.  She is currently asymptomatic.  Not associate with nausea, vomiting, diaphoresis, chest pain dyspnea.  No change in bowel or bladder function.  She has been compliant with her diet regimen set forth by her bariatric surgeon.  No recent travel or sick contacts.  No suspicious oral intake.  No rashes.  No unexpected weight loss, no night sweats, no chronic alcohol abuse, no chronic acetaminophen use, no jaundice or increased swelling to abdomen.   The history is provided by the patient. No language interpreter was used.  Abdominal Pain Pain location:  RUQ Pain quality: aching   Pain radiates to:  R shoulder Pain severity:  Mild Onset quality:  Sudden Timing:  Intermittent Progression:  Resolved Chronicity:  Recurrent Context: previous surgery   Context: not alcohol use   Relieved by:  Acetaminophen Associated symptoms: no chest pain, no cough, no dysuria, no fever, no nausea and no shortness of breath   Shoulder Pain Associated symptoms: no back pain and no fever   Patient Active Problem List   Diagnosis Date Noted    Status post gastric surgery 09/15/2021   Morbid obesity with BMI of 50.0-59.9, adult ([place]) 07/15/2021   Fibroid 08/24/2020   Iron deficiency anemia due to chronic blood loss 08/24/2020   Seasonal allergies 08/24/2020   History of hypothyroidism 03/15/2019   Class 3 severe obesity due to excess calories without serious comorbidity with body mass index (BMI) of 50.0 to 59.9 in adult [facility]) 03/15/2019   Prediabetes 02/04/2018   Hypothyroidism 02/04/2018   Excessive bleeding in premenopausal period 02/04/2018       Home Medications Prior to Admission medications   Medication Sig Start Date End Date Taking? Authorizing Provider  calcium carbonate (OSCAL) 1500 (600 Ca) MG TABS tablet Take 750 mg by mouth 3 (three) times daily.   Yes [provider]  HYDROcodone-acetaminophen (NORCO/VICODIN) 5-325 MG tablet Take 1 tablet by mouth every 6 (six) hours as needed for moderate pain. 09/27/21  Yes [provider]  levothyroxine (SYNTHROID) 125 MCG tablet Take 125 mcg by mouth daily before breakfast. 01/08/20  Yes [provider]  LUPRON DEPOT, 78-MONTH, 3.75 MG injection Inject 3.75 mg into the muscle every 28 (twenty-eight) days. 11/08/20  Yes [provider]  Multiple Vitamins-Minerals (WOMENS MULTIVITAMIN PO) Take 1 tablet by mouth daily. Bariatric MV   Yes [provider]      Allergies    Patient has no known allergies.    Review of Systems   Review of Systems  Constitutional:  Negative for activity change and fever.  HENT:  Negative for facial swelling and trouble  swallowing.   Eyes:  Negative for discharge and redness.  Respiratory:  Negative for cough and shortness of breath.   Cardiovascular:  Negative for chest pain and palpitations.  Gastrointestinal:  Positive for abdominal pain. Negative for nausea.  Genitourinary:  Negative for dysuria and flank pain.  Musculoskeletal:  Positive for arthralgias. Negative for back pain and gait problem.   Skin:  Negative for pallor and rash.  Neurological:  Negative for syncope and headaches.   Physical Exam Updated Vital Signs BP 114/72    Pulse 74    Temp 98.7 F (37.1 C) (Oral)    Resp (!) 22    Ht 5\' 3"  (1.6 m)    Wt 126.6 kg    SpO2 97%    BMI 49.42 kg/m  Physical Exam Vitals and nursing note reviewed.  Constitutional:      General: She is not in acute distress.    Appearance: Normal appearance. She is well-developed. She is not ill-appearing, toxic-appearing or diaphoretic.  HENT:     Head: Normocephalic and atraumatic.     Right Ear: External ear normal.     Left Ear: External ear normal.     Nose: Nose normal.     Mouth/Throat:     Mouth: Mucous membranes are moist.  Eyes:     General: No scleral icterus.       Right eye: No discharge.        Left eye: No discharge.     Conjunctiva/sclera: Conjunctivae normal.  Cardiovascular:     Rate and Rhythm: Normal rate and regular rhythm.     Pulses: Normal pulses.     Heart sounds: Normal heart sounds.  Pulmonary:     Effort: Pulmonary effort is normal. No respiratory distress.     Breath sounds: Normal breath sounds.  Abdominal:     General: Abdomen is flat.     Palpations: Abdomen is soft.     Tenderness: There is no abdominal tenderness. There is no guarding or rebound. Negative signs include McBurney's sign.     Comments: Nonperitoneal  Musculoskeletal:        General: Normal range of motion.     Cervical back: Normal range of motion.     Right lower leg: No edema.     Left lower leg: No edema.  Skin:    General: Skin is warm and dry.     Capillary Refill: Capillary refill takes less than 2 seconds.  Neurological:     Mental Status: She is alert.  Psychiatric:        Mood and Affect: Mood normal.        Behavior: Behavior normal.    ED Results / Procedures / Treatments   Labs (all labs ordered are listed, but only abnormal results are displayed) Labs Reviewed  LIPASE, BLOOD - Abnormal; Notable for the  following components:      Result Value   Lipase 54 (*)    All other components within normal limits  I-STAT BETA HCG BLOOD, ED ([facility], [facility], [facility] ONLY) - Abnormal; Notable for the following components:   I-stat hCG, quantitative 5.6 (*)    All other components within normal limits  CBC  COMPREHENSIVE METABOLIC PANEL  PREGNANCY, URINE    EKG EKG Interpretation  Date/Time:  Tuesday October 14 2021 02:51:41 EST Ventricular Rate:  69 PR Interval:  121 QRS Duration: 103 QT Interval:  439 QTC Calculation: 471 R Axis:   28 Text Interpretation: Sinus rhythm Confirmed by  Wynona Dove ([id number]) on 10/14/2021 3:46:19 AM  Radiology CT ABDOMEN PELVIS W CONTRAST  Result Date: 10/14/2021 CLINICAL DATA:  Abnormal liver ultrasound. EXAM: CT ABDOMEN AND PELVIS WITH CONTRAST TECHNIQUE: Multidetector CT imaging of the abdomen and pelvis was performed using the standard protocol following bolus administration of intravenous contrast. CONTRAST:  61mL OMNIPAQUE IOHEXOL 350 MG/ML SOLN COMPARISON:  Right upper quadrant ultrasound dated 09/12/2021. FINDINGS: Lower chest: The visualized lungs are clear. No intra-abdominal free air or free fluid. Hepatobiliary: There is fatty infiltration of the liver. Two ill-defined high attenuating liver masses measure up to 6 x 7 cm in the inferior left lobe of the liver and 8 x 8 cm in the right lobe of liver. These lesions are not characterized on this CT. In the absence of known history of primary malignancy and given the patient's gender and age these are favored to represent hepatic adenoma, or FNH. Other etiologies including malignancy are not excluded. Further characterization with liver mass protocol MRI is recommended. No intrahepatic biliary dilatation. The gallbladder is unremarkable. Pancreas: Unremarkable. No pancreatic ductal dilatation or surrounding inflammatory changes. Spleen: Normal in size without focal abnormality. Adrenals/Urinary Tract: The adrenal glands are  unremarkable. The kidneys, visualized ureters, and urinary bladder appear unremarkable. Stomach/Bowel: Postsurgical changes of the gastric sleeve. There is no bowel obstruction or active inflammation. The appendix is normal. Vascular/Lymphatic: The abdominal aorta and IVC unremarkable. No portal venous gas. There is no adenopathy. Reproductive: The uterus is anteverted. The uterus is enlarged with an apparent 7.5 x 8.7 cm fibroid. No adnexal masses. Other: None Musculoskeletal: No acute or significant osseous findings. IMPRESSION: 1. Indeterminate large liver masses as above. Further characterization liver mass protocol MRI is recommended. 2. Enlarged fibroid uterus. 3. No bowel obstruction. Normal appendix. Electronically Signed   By: Anner Crete M.D.   On: 10/14/2021 02:49    Procedures Procedures    Cardiac monitoring reviewed by myself demonstrates sinus bradycardia  Medications Ordered in ED Medications  sodium chloride (PF) 0.9 % injection (has no administration in time range)  sodium chloride 0.9 % bolus 1,000 mL (0 mLs Intravenous Stopped 10/14/21 0242)  famotidine (PEPCID) IVPB 20 mg premix (0 mg Intravenous Stopped 10/14/21 0242)  alum & mag hydroxide-simeth (MAALOX/MYLANTA) 200-200-20 MG/5ML suspension 30 mL (30 mLs Oral Given 10/14/21 0144)    And  lidocaine (XYLOCAINE) 2 % viscous mouth solution 15 mL (15 mLs Oral Given 10/14/21 0144)  iohexol (OMNIPAQUE) 350 MG/ML injection 80 mL (80 mLs Intravenous Contrast Given 10/14/21 0230)    ED Course/ Medical Decision Making/ A&P                           Medical Decision Making   CC: Abdominal pain, shoulder pain  This patient complains of above; this involves an extensive number of treatment options and is a complaint that carries with it a high risk of complications and morbidity. Vital signs were reviewed. Serious etiologies considered.  Record review:  Previous records obtained and reviewed   Additional history obtained from  spouse   Work up as above, notable for:  Labs & imaging results that were available during my care of the patient were reviewed by me and considered in my medical decision making.   Patient with ongoing abdominal pain x1 month, history of abnormal ultrasound concern for possible liver malignancy.  She has not received MRI yet.   I ordered imaging studies which included CT abdomen pelvis IV contrast  and I independently visualized and interpreted imaging which showed indeterminate liver lesion, possible hepatic adenoma.  Labs reviewed and are stable, AST and ALT are within normal limits, bilirubin is not elevated.  She does not appear jaundice.    Management: GI cocktail, IV fluids  Reassessment:  Patient remains asymptomatic.    Rpt abdominal exam is soft, nontender, nonperitoneal.  She is tolerant oral intake.  No abdominal pain.  No nausea or vomiting.  CT imaging reviewed.  Labs reviewed and are stable.  Favor multiple hepatic lesions is likely etiology of patient's discomfort.  She has MRI scheduled for Thursday.  Recommend patient follow-up with MRI.  Follow-up with PCP following MRI.   The patient improved significantly and was discharged in stable condition. Detailed discussions were had with the patient regarding current findings, and need for close f/u with PCP or on call doctor. The patient has been instructed to return immediately if the symptoms worsen in any way for re-evaluation. Patient verbalized understanding and is in agreement with current care plan. All questions answered prior to discharge.         This chart was dictated using voice recognition software.  Despite best efforts to proofread,  errors can occur which can change the documentation meaning.  Final Clinical Impression(s) / ED Diagnoses Final diagnoses:  Right upper quadrant abdominal pain  Liver lesion    Rx / DC Orders ED Discharge Orders     None         Jeanell Sparrow, DO 10/14/21  9935

## 2021-10-14 NOTE — ED Notes (Signed)
Back from CT

## 2021-10-14 NOTE — Telephone Encounter (Signed)
Pt went to [facility] ER and will see dr banks on Friday 10-17-2021 for follow

## 2021-10-14 NOTE — Discharge Instructions (Addendum)
Please follow-up for your MRI on Thursday.  Return to emergency department for any worsening or worrisome symptoms.  There are many causes of abdominal pain. Most pain is not serious and goes away, but some pain gets worse, changes, or will not go away. Please return to the emergency department or see your doctor right away if you (or your family member) experience any of the following:  1. Pain that gets worse or moves to just one spot.  2. Pain that gets worse if you cough or sneeze.  3. Pain with going over a bump in the road.  4. Pain that does not get better in 24 hours.  5. Inability to keep down liquids (vomiting)-especially if you are making less urine.  6. Fainting.  7. Blood in the vomit or stool.  8. High fever or shaking chills.  9. Swelling of the abdomen.  10. Any new or worsening problem.      Follow-up Instructions  See your primary care provider if not completely better in the next 2-3 days. Come to the ED if you are unable to see them in this time frame.    Additional Instructions  No alcohol.  No caffeine, aspirin, or cigarettes.   Please return to the emergency department immediately for any new or concerning symptoms, or if you get worse.

## 2021-10-14 NOTE — ED Notes (Signed)
Dr at bedside.

## 2021-10-15 DIAGNOSIS — F329 Major depressive disorder, single episode, unspecified: Secondary | ICD-10-CM | POA: Diagnosis not present

## 2021-10-16 ENCOUNTER — Ambulatory Visit
Admission: RE | Admit: 2021-10-16 | Discharge: 2021-10-16 | Disposition: A | Discharge status: Home / Self Care | Payer: BC Managed Care – PPO | Source: Ambulatory Visit | Attending: Emergency Medicine | Admitting: Emergency Medicine

## 2021-10-16 ENCOUNTER — Other Ambulatory Visit: Payer: Self-pay

## 2021-10-16 DIAGNOSIS — K760 Fatty (change of) liver, not elsewhere classified: Secondary | ICD-10-CM | POA: Diagnosis not present

## 2021-10-16 MED ORDER — GADOBENATE DIMEGLUMINE 529 MG/ML IV SOLN
20.0000 mL | Freq: Once | INTRAVENOUS | Status: AC | PRN
  Administered 2021-10-16: 20 mL via INTRAVENOUS

## 2021-10-17 ENCOUNTER — Ambulatory Visit: Payer: BC Managed Care – PPO | Admitting: Family Medicine

## 2021-10-17 ENCOUNTER — Encounter: Payer: Self-pay | Admitting: Family Medicine

## 2021-10-17 VITALS — BP 102/78 | HR 83 | Temp 98.2°F | Wt 282.8 lb

## 2021-10-17 DIAGNOSIS — K760 Fatty (change of) liver, not elsewhere classified: Secondary | ICD-10-CM | POA: Diagnosis not present

## 2021-10-17 DIAGNOSIS — R1011 Right upper quadrant pain: Secondary | ICD-10-CM

## 2021-10-17 DIAGNOSIS — R160 Hepatomegaly, not elsewhere classified: Secondary | ICD-10-CM

## 2021-10-17 NOTE — Progress Notes (Signed)
Subjective:    Patient ID: Christine Nelson, female    DOB: Jun 08, 1991, 31 y.o.   MRN: [id number]  Chief Complaint  Patient presents with   Follow-up    From [facility] and and discuss MRI results that came back yesterday     HPI Patient is a 31 year old female with past medical history significant for obesity status post sleeve gastrectomy 07/15/2021, DM2, fibroids, GERD hypothyroidism, PCOS, vitamin D deficiency who was seen today for follow-up on liver lesions, initially noted on RUQ [place] on 09/12/2021.  Pt seen in ED earlier this week for RUQ pain with radiation to right shoulder.  CT abdomen/pelvis with 3 liver lesions.  MRI liver 10/16/2021 with hepatic steatosis, and liver lesions up to 8.2 cm felt to be benign hepatic adenomas or focal nodular hyperplasia.  Pt notes improvement in RUQ pain, but is concerned it will return.  When pain starts pt will take a hydrocodone which provides relief for the rest of the day.  Patient inquires about what can be done to get rid of the lesions and for the hepatic steatosis.  Past Medical History:  Diagnosis Date   Asthma    with allergy season   Diabetes mellitus, type II ([place])    Hgb A1c in 03/2019 was 6.8%   Fibroids    GERD (gastroesophageal reflux disease)    History of prediabetes    Hypothyroidism    Obesity    PCOS (polycystic ovarian syndrome)    Uterine leiomyoma    Vitamin D deficiency     No Known Allergies  ROS General: Denies fever, chills, night sweats, changes in weight, changes in appetite HEENT: Denies headaches, ear pain, changes in vision, rhinorrhea, sore throat CV: Denies CP, palpitations, SOB, orthopnea Pulm: Denies SOB, cough, wheezing GI: Denies abdominal pain, nausea, vomiting, diarrhea, constipation +intermittent RUQ pain GU: Denies dysuria, hematuria, frequency, vaginal discharge Msk: Denies muscle cramps, joint pains Neuro: Denies weakness, numbness, tingling Skin: Denies rashes, bruising Psych: Denies depression,  anxiety, hallucinations     Objective:    Blood pressure 102/78, pulse 83, temperature 98.2 F (36.8 C), temperature source Oral, weight 282 lb 12.8 oz (128.3 kg), SpO2 96 %.  Gen. Pleasant, well-nourished, in no distress, normal affect   HEENT: [place]/AT, face symmetric, conjunctiva clear, no scleral icterus, PERRLA, EOMI, nares patent without drainage Lungs: no accessory muscle use Cardiovascular: RRR, no peripheral edema Abdomen: BS present, soft, NT/ND, no hepatosplenomegaly. Musculoskeletal: No deformities, no cyanosis or clubbing, normal tone Neuro:  A&Ox3, CN II-XII intact, normal gait Skin:  Warm, no lesions/ rash   Wt Readings from Last 3 Encounters:  10/17/21 282 lb 12.8 oz (128.3 kg)  10/13/21 279 lb (126.6 kg)  10/01/21 284 lb 9.6 oz (129.1 kg)    Lab Results  Component Value Date   WBC 8.9 10/13/2021   HGB 12.1 10/13/2021   HCT 38.8 10/13/2021   PLT 342 10/13/2021   GLUCOSE 79 10/13/2021   CHOL 153 05/01/2021   TRIG 62.0 05/01/2021   HDL 41.10 05/01/2021   LDLCALC 99 05/01/2021   ALT 35 10/13/2021   AST 40 10/13/2021   NA 139 10/13/2021   K 3.9 10/13/2021   CL 106 10/13/2021   CREATININE 0.82 10/13/2021   BUN 18 10/13/2021   CO2 25 10/13/2021   TSH 0.90 10/01/2021   HGBA1C 5.3 10/01/2021    Assessment/Plan:  Liver masses -Stable -Incidental finding noted on RUQ ultrasound 09/12/21 -MR liver with without contrast 10/16/2021 with bilobar arterial enhancing  attic lesions measuring up to 8.2 cm, 2 of which are subscapular location.  Given patient's age most certainly reflect benign hepatic adenomas or focal nodular hyperplasia, which in the setting of hepatic adenoma given their size these would be worrisome for spontaneous hemorrhage, consider surgical consult.  Additionally suggest follow-up hepatic protocol MRI with and without Eovist (hepatobiliary agent) contrast in 3 months for more definitive characterization and assessment of stability.  Hepatic  steatosis. - Plan: Ambulatory referral to General Surgery  RUQ abdominal pain -intermittent -Discussed avoiding Tylenol.  -Limit ibuprofen 2/2 recent sleeve gastrectomy -Heat and other supportive care  Hepatic steatosis -Continue diet/lifestyle modifications including reading labels to decrease fat intake -Continue with above  F/u prn  Grier Mitts, MD

## 2021-10-23 ENCOUNTER — Telehealth: Payer: Self-pay | Admitting: Family Medicine

## 2021-10-23 NOTE — Telephone Encounter (Signed)
Pt is calling and his seen dr banks on 10-17-2021 and would like to know if dr banks would refill her HYDROcodone-acetaminophen (NORCO/VICODIN) 5-325 MG tablet this med was not previously prescribed by dr banks. Pt states md is aware she is taking this medication  [id number] - [place], [place] - [place]. Phone:  [phone]  Fax:  [phone]

## 2021-10-27 NOTE — Telephone Encounter (Signed)
Patient called to check the status of her refill request for HYDROcodone-acetaminophen (NORCO/VICODIN) 5-325 MG tablet to be sent to [id number] - [place], [place] - [place].  Patient says that she has 3-4 pills left that will last her for a few days but she is needing the refill sent in because she is is pain from her liver.   Please advise.

## 2021-10-27 NOTE — Telephone Encounter (Signed)
Patient called because she has called three different times for a refill on her prescription. Patient states that she only has two pills left and she is afraid of the pain coming back before her surgery   Please send to   [id number] - [place], [place] - [place]. Phone:  [phone]  Fax:  [phone]        Please advise

## 2021-10-28 ENCOUNTER — Other Ambulatory Visit: Payer: Self-pay

## 2021-10-28 ENCOUNTER — Emergency Department (HOSPITAL_BASED_OUTPATIENT_CLINIC_OR_DEPARTMENT_OTHER)
Admission: EM | Admit: 2021-10-28 | Discharge: 2021-10-28 | Disposition: A | Discharge status: Home / Self Care | Payer: BC Managed Care – PPO | Attending: Emergency Medicine | Admitting: Emergency Medicine

## 2021-10-28 ENCOUNTER — Encounter (HOSPITAL_BASED_OUTPATIENT_CLINIC_OR_DEPARTMENT_OTHER): Payer: Self-pay

## 2021-10-28 DIAGNOSIS — R1013 Epigastric pain: Secondary | ICD-10-CM | POA: Insufficient documentation

## 2021-10-28 DIAGNOSIS — J45909 Unspecified asthma, uncomplicated: Secondary | ICD-10-CM | POA: Diagnosis not present

## 2021-10-28 DIAGNOSIS — E119 Type 2 diabetes mellitus without complications: Secondary | ICD-10-CM | POA: Diagnosis not present

## 2021-10-28 DIAGNOSIS — Z79899 Other long term (current) drug therapy: Secondary | ICD-10-CM | POA: Insufficient documentation

## 2021-10-28 DIAGNOSIS — E039 Hypothyroidism, unspecified: Secondary | ICD-10-CM | POA: Insufficient documentation

## 2021-10-28 DIAGNOSIS — G8929 Other chronic pain: Secondary | ICD-10-CM | POA: Diagnosis not present

## 2021-10-28 MED ORDER — HYDROCODONE-ACETAMINOPHEN 5-325 MG PO TABS: 1.0000 | ORAL_TABLET | Freq: Four times a day (QID) | ORAL | 0 refills | Status: DC | PRN

## 2021-10-28 NOTE — ED Provider Notes (Signed)
[facility] EMERGENCY DEPT Provider Note   CSN: [id number] Arrival date & time: 10/28/21  0431     History  Chief Complaint  Patient presents with   Abdominal Pain    Christine Nelson is a 31 y.o. female.  HPI     This is a 31 year old female with a history of gastric sleeve, obesity, recent work-up for liver lesions who presents with abdominal pain.  Patient reports onset of epigastric pain around midnight.  She has had similar pain in the past which resolved with hydrocodone.  She associates this pain with her ongoing liver lesions.  She states tonight the pain took approximately 4 hours to go away and this concerned her.  It normally takes 30 to 45 minutes.  She is currently pain-free.  Has not had any nausea or vomiting.  Pain is similar in character to the pain that she has been having over the last month.  Does not note any association of pain with food.  Reports that it is epigastric and aching.  At times it radiates to the right shoulder.  Chart reviewed.  Recent imaging including CT on 1/10 with liver lesions.  She has had her MRI which is most suggestive of hepatic adenomas.  Awaiting surgical follow-up.  Home Medications Prior to Admission medications   Medication Sig Start Date End Date Taking? Authorizing Provider  calcium carbonate (OSCAL) 1500 (600 Ca) MG TABS tablet Take 750 mg by mouth 3 (three) times daily.    [provider]  HYDROcodone-acetaminophen (NORCO/VICODIN) 5-325 MG tablet Take 1 tablet by mouth every 6 (six) hours as needed for moderate pain. 10/28/21   Clessie Karras, Barbette Hair, MD  levothyroxine (SYNTHROID) 125 MCG tablet Take 125 mcg by mouth daily before breakfast. 01/08/20   [provider]  LUPRON DEPOT, 55-MONTH, 3.75 MG injection Inject 3.75 mg into the muscle every 28 (twenty-eight) days. 11/08/20   [provider]  Multiple Vitamins-Minerals (WOMENS MULTIVITAMIN PO) Take 1 tablet by mouth daily. Bariatric MV     [provider]      Allergies    Patient has no known allergies.    Review of Systems   Review of Systems  Constitutional:  Negative for fever.  Gastrointestinal:  Positive for abdominal pain. Negative for nausea and vomiting.  All other systems reviewed and are negative.  Physical Exam Updated Vital Signs BP (!) 103/58 (BP Location: Right Arm)    Pulse 64    Temp 97.8 F (36.6 C) (Oral)    Resp 18    Ht 1.6 m (5\' 3" )    Wt 127 kg    SpO2 98%    BMI 49.60 kg/m  Physical Exam Vitals and nursing note reviewed.  Constitutional:      Appearance: She is well-developed. She is not ill-appearing.  HENT:     Head: Normocephalic and atraumatic.     Mouth/Throat:     Mouth: Mucous membranes are moist.  Eyes:     Pupils: Pupils are equal, round, and reactive to light.  Cardiovascular:     Rate and Rhythm: Normal rate and regular rhythm.     Heart sounds: Normal heart sounds.  Pulmonary:     Effort: Pulmonary effort is normal. No respiratory distress.     Breath sounds: No wheezing.  Abdominal:     General: Bowel sounds are normal.     Palpations: Abdomen is soft.     Tenderness: There is no abdominal tenderness. There is no guarding or  rebound.  Musculoskeletal:     Cervical back: Neck supple.  Skin:    General: Skin is warm and dry.  Neurological:     Mental Status: She is alert and oriented to person, place, and time.  Psychiatric:        Mood and Affect: Mood normal.    ED Results / Procedures / Treatments   Labs (all labs ordered are listed, but only abnormal results are displayed) Labs Reviewed - No data to display  EKG None  Radiology No results found.  Procedures Procedures    Medications Ordered in ED Medications - No data to display  ED Course/ Medical Decision Making/ A&P                           Medical Decision Making Risk Prescription drug management.   This patient presents to the ED for concern of abdominal pain, this involves an  extensive number of treatment options, and is a complaint that carries with it a high risk of complications and morbidity.  The differential diagnosis includes gastritis, gastroenteritis, cholecystitis, ongoing pain related to hepatic lesions  MDM:    This is a 31 year old female who presents with epigastric pain.  Pain is subacute to chronic in nature.  She has had similar pain over the last month that she associates with her liver abscess.  She is nontoxic and vital signs are reassuring.  She is currently pain-free after taking hydrocodone at home.  It just took longer for her pain to resolve.  She has a nontender abdomen.  She is clinically hemodynamically stable.  Patient is concerned that her pain did not immediately resolve with hydrocodone.  She also states she is running out of hydrocodone and is concerned that she will not be able to manage her ongoing pain until surgical follow-up.  We reviewed her chart and discussed options.  Patient does not wish to pursue at this time any additional abdominal pain work-up including labs or imaging given that the pain was consistent with her prior pain episodes.  She is more requesting guidance on how to manage her pain if she runs out of hydrocodone.  Discussed with her that ultimately she will need to have a primary provider prescribe ongoing narcotic pain medications.  I have reviewed her narcotic database and she only has 1 prescription from 12/24 for 20 pills.  It does not appear that she is abusing this medication.  I will give her a short course of 5 pills until she can follow-up with her primary physician.  Feel it is reasonable not to pursue further work-up given recent extensive work-up including CT imaging, MRI, and lab work.  She is pain-free and has a benign abdominal exam. (Labs, imaging)  Labs: I Ordered, and personally interpreted labs.  The pertinent results include: None  Imaging Studies ordered: I ordered imaging studies including none I  independently visualized and interpreted imaging. I agree with the radiologist interpretation  Additional history obtained from patient.  External records from outside source obtained and reviewed including prior imaging and notes  Critical Interventions: None  Consultations: I requested consultation with the none,  and discussed lab and imaging findings as well as pertinent plan - they recommend: None  Cardiac Monitoring: The patient was maintained on a cardiac monitor.  I personally viewed and interpreted the cardiac monitored which showed an underlying rhythm of: Normal sinus rhythm  Reevaluation: After the interventions noted above, I reevaluated the  patient and found that they have :stayed the same   Considered admission for: N/A  Social Determinants of Health: Lives independently  Disposition: Discharge  Co morbidities that complicate the patient evaluation  Past Medical History:  Diagnosis Date   Asthma    with allergy season   Diabetes mellitus, type II (HCC)    Hgb A1c in 03/2019 was 6.8%   Fibroids    GERD (gastroesophageal reflux disease)    History of prediabetes    Hypothyroidism    Obesity    PCOS (polycystic ovarian syndrome)    Uterine leiomyoma    Vitamin D deficiency      Medicines Meds ordered this encounter  Medications   HYDROcodone-acetaminophen (NORCO/VICODIN) 5-325 MG tablet    Sig: Take 1 tablet by mouth every 6 (six) hours as needed for moderate pain.    Dispense:  5 tablet    Refill:  0    I have reviewed the patients home medicines and have made adjustments as needed  Problem List / ED Course: Problem List Items Addressed This Visit   None Visit Diagnoses     Chronic abdominal pain    -  Primary   Relevant Medications   HYDROcodone-acetaminophen (NORCO/VICODIN) 5-325 MG tablet                   Final Clinical Impression(s) / ED Diagnoses Final diagnoses:  Chronic abdominal pain    Rx / DC Orders ED Discharge  Orders          Ordered    HYDROcodone-acetaminophen (NORCO/VICODIN) 5-325 MG tablet  Every 6 hours PRN        10/28/21 0513              Merryl Hacker, MD 10/28/21 [phone]

## 2021-10-28 NOTE — Telephone Encounter (Signed)
Patient called to see what she could do incase her abdominal pain comes back and she does not have any medication, as she is about to run out. She asked to speak with CMA. I let her know that Dr.Banks and CMA were out today and sent her to triage.     FYI

## 2021-10-28 NOTE — Discharge Instructions (Signed)
Continue outpatient work-up with general surgery and your primary physician regarding her liver lesions.  If you need refills of your medication, you should contact your primary physician.  They may require an office visit.

## 2021-10-28 NOTE — ED Triage Notes (Signed)
Epigastric pain since around midnight, but has the had same pain since 09/12/2021.  Was seen here and was found to have non-cancerous masses to her liver.  Was referred to a surgeon, but has not been seen as of yet.  Patient states she took Hydrocodone 5/325 around midnight.

## 2021-10-28 NOTE — Telephone Encounter (Signed)
Patient was sent back from triage. Patient wants to know if there is anything else at all that can be sent in for the pain. Patient is unable to take tylenol due to liver issues and she stated that she understands doctors are wary of putting her on narcotics, but if there is anything else that can be sent in to help please send to pharmacy.   Please send to    [id number] - [place], [place]. Phone:  [phone]  Fax:  [phone]        Please advise

## 2021-10-29 ENCOUNTER — Other Ambulatory Visit: Payer: Self-pay | Admitting: Family Medicine

## 2021-10-29 DIAGNOSIS — F329 Major depressive disorder, single episode, unspecified: Secondary | ICD-10-CM | POA: Diagnosis not present

## 2021-10-29 DIAGNOSIS — R1011 Right upper quadrant pain: Secondary | ICD-10-CM

## 2021-10-29 MED ORDER — TRAMADOL HCL 50 MG PO TABS: 50.0000 mg | ORAL_TABLET | Freq: Three times a day (TID) | ORAL | 0 refills | Status: AC | PRN

## 2021-10-29 NOTE — Telephone Encounter (Signed)
This provider was out of the office yesterday.  Previous request for Rx for norco was declined as pt should avoid tylenol given ongoing liver issues and recent elevated LFTs (now resolved).  Will send in rx for Tramadol.

## 2021-10-30 DIAGNOSIS — D259 Leiomyoma of uterus, unspecified: Secondary | ICD-10-CM | POA: Diagnosis not present

## 2021-10-30 NOTE — Telephone Encounter (Signed)
Patient is aware that Rx was sent in & has picked it up.

## 2021-11-03 ENCOUNTER — Telehealth: Payer: Self-pay | Admitting: Skilled Nursing Facility1

## 2021-11-03 DIAGNOSIS — K7689 Other specified diseases of liver: Secondary | ICD-10-CM | POA: Diagnosis not present

## 2021-11-03 DIAGNOSIS — D134 Benign neoplasm of liver: Secondary | ICD-10-CM | POA: Diagnosis not present

## 2021-11-03 NOTE — Telephone Encounter (Signed)
Returned pts call.   Pt states she had to go to the ED in December due to abdominal pain where they discovered liver masses which are benign. Pt states her docotr believes to be the issue of acid reflux and states her gallbladder is fine. Pt states she has started daily acid reflux medication. Pt states her discomfort makes her feel really full. Pt states she has been doing protein shakes and is unsure if she is having fear not wanting food or the liver issues specifically. Pt states she had a weight stall for about 3 weeks which just broke.   Plan is currently to try acid reflux medicine first. Pt states she has not started it yet stating she has not needed  Dietitian advised pt to take that antireflux daily and will see her tomorrow at 5pm

## 2021-11-04 ENCOUNTER — Other Ambulatory Visit: Payer: Self-pay

## 2021-11-04 ENCOUNTER — Encounter: Payer: BC Managed Care – PPO | Attending: Surgery | Admitting: Skilled Nursing Facility1

## 2021-11-04 DIAGNOSIS — Z6841 Body Mass Index (BMI) 40.0 and over, adult: Secondary | ICD-10-CM | POA: Insufficient documentation

## 2021-11-04 NOTE — Progress Notes (Signed)
Bariatric Nutrition Follow-Up Visit Medical Nutrition Therapy    NUTRITION ASSESSMENT    Surgery date: 07/15/2021 Surgery type: sleeve Start weight at [facility]: 331.7 pounds Weight today: 276.2 pounds  Body Composition Scale 07/29/2021 08/27/2021 11/04/2021  Current Body Weight 307.2 292.3 276.2  Total Body Fat % 49.1 48.1 47.4  Visceral Fat 16 15 15   Fat-Free Mass % 50.8 51.8 52.5   Total Body Water % 39.9 40.4 40.7  Muscle-Mass lbs 33.2 33.1 32.3  BMI 54 51.4 49.8  Body Fat Displacement            Torso  lbs 93.8 87.4 81.2         Left Leg  lbs 18.7 17.4 16.2         Right Leg  lbs 18.7 17.4 16.2         Left Arm  lbs 9.3 8.7 8.1         Right Arm   lbs 9.3 8.7 8.1   Clinical  Medical hx: PCOS, diabetes Medications: see list Labs: A1C 6.2 Notable signs/symptoms:  Any previous deficiencies? vitamin D   Lifestyle & Dietary Hx  Pt states she is not experiencing any pain current having had lose stools yesterday.  Pt states the pain could be coming from the liver lesions  Pt states the pain she was feeling felt like a spasm stating is not in her chest more in the diaphragm region.  Pt states she is interested in seeing a specialist for her liver.  Pt states the pain was so bad she lost her breath with no nausea or vomiting.  Pt state she does talk therpy weekly.  Plan: call back for a follow up appt after you have seen the gastroenterologist   Estimated daily fluid intake: 55+ oz Estimated daily protein intake: 60+ g Supplements: multi and calcium Current average weekly physical activity: BELT (no issues)  24-Hr Dietary Recall First Meal: protein shake or smoked [place] sausage Snack:  almonds or chicken salad cups or greek yogurt Second Meal: shrimp or chipotle chicken or chicken in a wrap (high fiber) or grilled shrimp high fiber taco + light sour cream and peppers Snack:  chicken salad or yogurt or almonds  Third Meal: shrimp or fish or chicken fajita  Snack:   Beverages: water, gatorade zero, water + flavoring, ocean spary 5 calorie juice   Post-Op Goals/ Signs/ Symptoms Using straws: no Drinking while eating: no Chewing/swallowing difficulties: no Changes in vision: no Changes to mood/headaches: no Hair loss/changes to skin/nails: no Difficulty focusing/concentrating: no Sweating: no Limb weakness: no Dizziness/lightheadedness: no Palpitations: no  Carbonated/caffeinated beverages: no N/V/D/C/Gas: no Abdominal pain: no Dumping syndrome: no    NUTRITION DIAGNOSIS  Overweight/obesity ([place]-3.3) related to past poor dietary habits and physical inactivity as evidenced by completed bariatric surgery and following dietary guidelines for continued weight loss and healthy nutrition status.     NUTRITION INTERVENTION Nutrition counseling (C-1) and education (E-2) to facilitate bariatric surgery goals, including: The importance of consuming adequate calories as well as certain nutrients daily due to the body's need for essential vitamins, minerals, and fats The importance of daily physical activity and to reach a goal of at least 150 minutes of moderate to vigorous physical activity weekly (or as directed by their physician) due to benefits such as increased musculature and improved lab values The importance of intuitive eating specifically learning hunger-satiety cues and understanding the importance of learning a new body: The importance of mindful eating to avoid grazing  behaviors  Nutrition facts label education Plant based protien options   -Continue to aim for a minimum of 64 fluid ounces 7 days a week with at least 30 ounces being plain water  -Eat non-starchy vegetables 2 times a day 7 days a week  -Start out with soft cooked vegetables today and tomorrow; if tolerated begin to eat raw vegetables or cooked including salads  -Eat your 3 ounces of protein first then start in on your non-starchy vegetables; once you understand how much of  your meal leads to satisfaction and not full while still eating 3 ounces of protein and non-starchy vegetables you can eat them in any order   -Continue to aim for 30 minutes of activity at least 5 times a week  -Do NOT cook with/add to your food: alfredo sauce, cheese sauce, barbeque sauce, ketchup, fat back, butter, bacon grease, grease, Crisco, OR SUGAR   Handouts Provided Include  Phase 4  Learning Style & Readiness for Change Teaching method utilized: Visual & Auditory  Demonstrated degree of understanding via: Teach Back  Readiness Level: Action Barriers to learning/adherence to lifestyle change: none identified   RD's Notes for Next Visit Assess adherence to pt chosen goals    MONITORING & EVALUATION Dietary intake, weekly physical activity, body weight  Next Steps Patient is to follow-up in 3-4 months

## 2021-11-06 ENCOUNTER — Ambulatory Visit: Payer: BC Managed Care – PPO | Admitting: Family Medicine

## 2021-11-06 ENCOUNTER — Encounter: Payer: Self-pay | Admitting: Family Medicine

## 2021-11-06 VITALS — BP 130/68 | HR 73 | Temp 97.8°F | Wt 279.2 lb

## 2021-11-06 DIAGNOSIS — R1011 Right upper quadrant pain: Secondary | ICD-10-CM

## 2021-11-06 DIAGNOSIS — D134 Benign neoplasm of liver: Secondary | ICD-10-CM | POA: Diagnosis not present

## 2021-11-06 DIAGNOSIS — Z903 Acquired absence of stomach [part of]: Secondary | ICD-10-CM

## 2021-11-06 NOTE — Progress Notes (Signed)
Subjective:    Patient ID: Christine Nelson, female    DOB: Sep 17, 1991, 31 y.o.   MRN: [id number]  Chief Complaint  Patient presents with   Follow-up    Follow up on liver masses, saw surgeon and told surgery would be possible but doesn't want to have surgery, would like a referral to a specialist, discuss next steps     HPI Patient is a  31 yo female with pmh sig for obestiy s/p gastric sleeve 06/2021, fibroids, hypothryoidism,  was seen today for follow-up on hepatic adenomas and abd pain.  Multiple solid masses noted on RUQ u/s12/9/22.  CT abd pelvis 10/14/21 with indeterminate large liver masses and Uterine fibroid.  MRI liver 10/16/21 with bilobar arterially enhancing hepatic lesions up to 8.2 cm, almost certainly benign hepatic adenomas or focal nodular hyperplasia.  Pt seen in ED 10/13/21 and 10/28/21 for abd pain.  Given hydrocodone and later tramadol for pain.  Patient seen by nutrition and general surgery on 11/03/2021.  Recommendations included weight loss and stopping Lupron.  Patient interested in second opinion.  Patient is losing weight as she is status post gastric sleeve on 06/2021.  Patient endorses hitting a plateau but recently restarted losing weight.  Weighed 276 lbs on home scale.  Past Medical History:  Diagnosis Date   Asthma    with allergy season   Diabetes mellitus, type II ([place])    Hgb A1c in 03/2019 was 6.8%   Fibroids    GERD (gastroesophageal reflux disease)    History of prediabetes    Hypothyroidism    Obesity    PCOS (polycystic ovarian syndrome)    Uterine leiomyoma    Vitamin D deficiency     No Known Allergies  ROS General: Denies fever, chills, night sweats, changes in weight, changes in appetite HEENT: Denies headaches, ear pain, changes in vision, rhinorrhea, sore throat CV: Denies CP, palpitations, SOB, orthopnea Pulm: Denies SOB, cough, wheezing GI: Denies  nausea, vomiting, diarrhea, constipation  +abdominal pain GU: Denies dysuria, hematuria,  frequency, vaginal discharge Msk: Denies muscle cramps, joint pains Neuro: Denies weakness, numbness, tingling Skin: Denies rashes, bruising Psych: Denies depression, anxiety, hallucinations    Objective:    Blood pressure 130/68, pulse 73, temperature 97.8 F (36.6 C), temperature source Oral, weight 279 lb 3.2 oz (126.6 kg), SpO2 98 %.  Gen. Pleasant, well-nourished, in no distress, normal affect   HEENT: [place]/AT, face symmetric, conjunctiva clear, no scleral icterus, PERRLA, EOMI, nares patent without drainage Lungs: no accessory muscle use, CTAB, no wheezes or rales Cardiovascular: RRR, no m/r/g, no peripheral edema Abdomen: BS present, soft, NT/ND, no hepatosplenomegaly. Musculoskeletal: No deformities, no cyanosis or clubbing, normal tone Neuro:  A&Ox3, CN II-XII intact, normal gait Skin:  Warm, no lesions/ rash   Wt Readings from Last 3 Encounters:  11/06/21 279 lb 3.2 oz (126.6 kg)  11/04/21 276 lb 3.2 oz (125.3 kg)  10/28/21 280 lb (127 kg)    Lab Results  Component Value Date   WBC 8.9 10/13/2021   HGB 12.1 10/13/2021   HCT 38.8 10/13/2021   PLT 342 10/13/2021   GLUCOSE 79 10/13/2021   CHOL 153 05/01/2021   TRIG 62.0 05/01/2021   HDL 41.10 05/01/2021   LDLCALC 99 05/01/2021   ALT 35 10/13/2021   AST 40 10/13/2021   NA 139 10/13/2021   K 3.9 10/13/2021   CL 106 10/13/2021   CREATININE 0.82 10/13/2021   BUN 18 10/13/2021   CO2 25 10/13/2021  TSH 0.90 10/01/2021   HGBA1C 5.3 10/01/2021    Assessment/Plan:  Hepatic adenoma  -Initially noted on right upper quadrant ultrasound 09/12/2021.  Further characterized on liver MRI 10/16/2021 with bilobar arterial enhancing hepatic lesions measuring up to 8.2 cm, 2 of which are subscapular location.  Given patient's age they are almost certainly reflect benign hepatic adenomas or focal nodular hyperplasia, which in the setting of hepatic adenoma given their size and location these would be worrisome for spontaneous  hemorrhage, consider surgical consult.  Additionally suggest follow-up hepatic protocol MRI with and without Eovist (hepatobiliary agent) contrast in 3 months for more definitive characterization and assessment of stability.2.  Hepatic steatosis. -Likely caused by OCPs. -Continue weight loss as a pt status post gastric sleeve. -Avoid hepatotoxic medications -Given size greater than 5 cm increased bleeding risk and is symptomatic resection and transarterial embolization which is associated with lesion regression are possible options. -Repeat MRI planned in 3 months with gen surgery. - Plan: Amb Referral to Hepatology  RUQ abdominal pain -2/2 hepatic adenoma -Tramadol as needed.  Patient advised to use sparingly as not a long-term plan.  Status post gastric sleeve -Procedure done 06/2021 -Hit a brief plateau but continue to lose weight again.  Now 276 pounds per home scale. -Continue vitamins daily -Continue follow-up with nutrition and surgeon  F/u prn  Grier Mitts, MD

## 2021-11-12 ENCOUNTER — Telehealth: Payer: Self-pay | Admitting: Skilled Nursing Facility1

## 2021-11-12 DIAGNOSIS — F329 Major depressive disorder, single episode, unspecified: Secondary | ICD-10-CM | POA: Diagnosis not present

## 2021-11-12 NOTE — Telephone Encounter (Signed)
Pt called asking some questions.    Dietitian answered her questions to her satisfaction.   Advsied pt to call back when she was ready for her next appt.

## 2021-11-14 ENCOUNTER — Other Ambulatory Visit: Payer: Self-pay

## 2021-11-14 ENCOUNTER — Emergency Department (HOSPITAL_BASED_OUTPATIENT_CLINIC_OR_DEPARTMENT_OTHER): Payer: BC Managed Care – PPO

## 2021-11-14 ENCOUNTER — Emergency Department (HOSPITAL_BASED_OUTPATIENT_CLINIC_OR_DEPARTMENT_OTHER)
Admission: EM | Admit: 2021-11-14 | Discharge: 2021-11-14 | Disposition: A | Discharge status: Home / Self Care | Payer: BC Managed Care – PPO | Attending: Emergency Medicine | Admitting: Emergency Medicine

## 2021-11-14 ENCOUNTER — Encounter (HOSPITAL_BASED_OUTPATIENT_CLINIC_OR_DEPARTMENT_OTHER): Payer: Self-pay | Admitting: Emergency Medicine

## 2021-11-14 DIAGNOSIS — R1030 Lower abdominal pain, unspecified: Secondary | ICD-10-CM | POA: Diagnosis not present

## 2021-11-14 DIAGNOSIS — R109 Unspecified abdominal pain: Secondary | ICD-10-CM | POA: Diagnosis not present

## 2021-11-14 DIAGNOSIS — R102 Pelvic and perineal pain: Secondary | ICD-10-CM | POA: Insufficient documentation

## 2021-11-14 LAB — URINALYSIS, ROUTINE W REFLEX MICROSCOPIC
Bilirubin Urine: NEGATIVE
Glucose, UA: NEGATIVE mg/dL
Ketones, ur: NEGATIVE mg/dL
Leukocytes,Ua: NEGATIVE
Nitrite: NEGATIVE
Specific Gravity, Urine: 1.029 (ref 1.005–1.030)
pH: 6 (ref 5.0–8.0)

## 2021-11-14 LAB — PREGNANCY, URINE: Preg Test, Ur: NEGATIVE

## 2021-11-14 NOTE — ED Triage Notes (Signed)
Reports she had urinate badly this evening.  After urinating and having a small bm she had a sharp pain that shot through her vagina.  Reports she took 1 tramadol and the pain is much better.

## 2021-11-14 NOTE — Discharge Instructions (Addendum)
Call your primary care doctor to arrange follow up   Return immediately back to the ER if:  Your symptoms worsen within the next 12-24 hours. You develop new symptoms such as new fevers, persistent vomiting, new pain, shortness of breath, or new weakness or numbness, or if you have any other concerns.

## 2021-11-14 NOTE — ED Provider Notes (Signed)
[facility] DEPT Provider Note   CSN: [id number] Arrival date & time: 11/14/21  2128     History  Chief Complaint  Patient presents with   Vaginal Pain    Christine Nelson is a 31 y.o. female.  Is a chief complaint of lower abdominal pain.  Describes as sharp pain cramping in nature.  She states that she was coming home and had to use the bathroom.  She had been trying to hold it but lost control and urinated on herself a little bit and then was ultimately able to sit down on the toilet and finished urinating.  Afterward she stated that she had crampy pelvic pain described as sharp and aching that lasted about 30 minutes.  She took a tramadol and since the pain is nearly gone describes it as 1 out of 10 in severity.  Otherwise no reports of fevers or cough no vomiting or diarrhea.  No flank pain or back pain reported.      Home Medications Prior to Admission medications   Medication Sig Start Date End Date Taking? Authorizing Provider  calcium carbonate (OSCAL) 1500 (600 Ca) MG TABS tablet Take 750 mg by mouth 3 (three) times daily.    [provider]  HYDROcodone-acetaminophen (NORCO/VICODIN) 5-325 MG tablet Take 1 tablet by mouth every 6 (six) hours as needed for moderate pain. 10/28/21   Horton, Barbette Hair, MD  levothyroxine (SYNTHROID) 125 MCG tablet Take 125 mcg by mouth daily before breakfast. 01/08/20   [provider]  LUPRON DEPOT, 82-MONTH, 3.75 MG injection Inject 3.75 mg into the muscle every 28 (twenty-eight) days. 11/08/20   [provider]  Multiple Vitamins-Minerals (WOMENS MULTIVITAMIN PO) Take 1 tablet by mouth daily. Bariatric MV    [provider]      Allergies    Patient has no known allergies.    Review of Systems   Review of Systems  Constitutional:  Negative for fever.  HENT:  Negative for ear pain.   Eyes:  Negative for pain.  Respiratory:  Negative for cough.   Cardiovascular:  Negative for  chest pain.  Gastrointestinal:  Positive for abdominal pain.  Genitourinary:  Negative for flank pain.  Musculoskeletal:  Negative for back pain.  Skin:  Negative for rash.  Neurological:  Negative for headaches.   Physical Exam Updated Vital Signs BP 113/72 (BP Location: Right Arm)    Pulse 79    Temp 98.2 F (36.8 C) (Oral)    Resp 18    Ht 5' 2.5" (1.588 m)    Wt 125.2 kg    SpO2 100%    BMI 49.68 kg/m  Physical Exam Constitutional:      General: She is not in acute distress.    Appearance: Normal appearance.  HENT:     Head: Normocephalic.     Nose: Nose normal.  Eyes:     Extraocular Movements: Extraocular movements intact.  Cardiovascular:     Rate and Rhythm: Normal rate.  Pulmonary:     Effort: Pulmonary effort is normal.  Abdominal:     Tenderness: There is abdominal tenderness.     Comments: Mild suprapubic tenderness on exam.  No guarding or rebound.  Musculoskeletal:        General: Normal range of motion.     Cervical back: Normal range of motion.  Neurological:     General: No focal deficit present.     Mental Status: She is alert. Mental status is at baseline.  ED Results / Procedures / Treatments   Labs (all labs ordered are listed, but only abnormal results are displayed) Labs Reviewed  URINALYSIS, ROUTINE W REFLEX MICROSCOPIC - Abnormal; Notable for the following components:      Result Value   Hgb urine dipstick TRACE (*)    Protein, ur TRACE (*)    Bacteria, UA RARE (*)    All other components within normal limits  PREGNANCY, URINE    EKG None  Radiology CT Abdomen Pelvis Wo Contrast  Result Date: 11/14/2021 CLINICAL DATA:  Dysuria, pelvic pain EXAM: CT ABDOMEN AND PELVIS WITHOUT CONTRAST TECHNIQUE: Multidetector CT imaging of the abdomen and pelvis was performed following the standard protocol without IV contrast. RADIATION DOSE REDUCTION: This exam was performed according to the departmental dose-optimization program which includes  automated exposure control, adjustment of the mA and/or kV according to patient size and/or use of iterative reconstruction technique. COMPARISON:  10/14/2021 FINDINGS: Lower chest: Lung bases are clear. Hepatobiliary: Liver is notable for a dominant 8.0 x 7.0 cm mass in segment 7 (series 2/image 17) and a dominant 5.3 x 7.7 cm mass in segment 3 (series 2/image 37). These are both stable versus minimally decreased from the recent prior CT. Differential considerations (as noted on prior MRI) include FNH (statistically favored) versus hepatic adenoma. Gallbladder is notable for mild gallbladder sludge (series 2/image 31), without associated inflammatory changes. No intrahepatic or extrahepatic ductal dilatation. Pancreas: Within normal limits. Spleen: Within normal limits. Adrenals/Urinary Tract: Adrenal glands are within normal limits. Kidneys are within normal limits.  No hydronephrosis. Bladder is underdistended but unremarkable. Stomach/Bowel: Postsurgical changes related to gastric sleeve. No evidence of bowel obstruction. Normal appendix (series 2/image 49). No colonic wall thickening or inflammatory changes. Vascular/Lymphatic: No evidence of abdominal aortic aneurysm. No suspicious abdominopelvic lymphadenopathy. Reproductive: Large fundal uterine fibroid, better evaluated on the prior. Bilateral ovaries are within normal limits. Other: No abdominopelvic ascites. Musculoskeletal: Visualized osseous structures are within normal limits. IMPRESSION: Uterine fibroids. Two dominant hepatic masses measuring up to 8.0 cm, similar to the prior. Differential considerations include FNH (statistically favored) versus hepatic adenomas. As recommended on prior MRI, follow-up MRI abdomen with/without Eovist contrast is suggested in 3 months. No interval change from priors. Electronically Signed   By: Julian Hy M.D.   On: 11/14/2021 22:47    Procedures Procedures    Medications Ordered in ED Medications - No  data to display  ED Course/ Medical Decision Making/ A&P                           Medical Decision Making Amount and/or Complexity of Data Reviewed Labs: ordered. Radiology: ordered.   Review of records shows November 06, 2021 patient visited primary care doctor's office for hepatic adenoma.  Vital signs on arrival are within normal limits pain is improved to about 1 out of 10 per patient.  Urinalysis unremarkable.  Pregnancy test is negative.  CT abdomen pelvis pursued.  No acute findings.  Radiologist notes hepatic masses are unchanged from prior imaging.  Patient remains pain-free.  Serial exams were done last exam done at 1055 with no guarding or tenderness.  Patient discharged home stable condition advised outpatient follow-up with her doctor within the.  Advised return for worsening pain fevers or any additional concerns.         Final Clinical Impression(s) / ED Diagnoses Final diagnoses:  Lower abdominal pain    Rx / DC Orders ED Discharge  Orders     None         Luna Fuse, MD 11/14/21 2259

## 2021-11-17 ENCOUNTER — Encounter: Payer: Self-pay | Admitting: Physician Assistant

## 2021-11-17 ENCOUNTER — Telehealth: Payer: Self-pay

## 2021-11-17 NOTE — Telephone Encounter (Signed)
Pt was seen in ED on 11/14/21. See below:  Is a chief complaint of lower abdominal pain.  Describes as sharp pain cramping in nature.  She states that she was coming home and had to use the bathroom.  She had been trying to hold it but lost control and urinated on herself a little bit and then was ultimately able to sit down on the toilet and finished urinating.  Afterward she stated that she had crampy pelvic pain described as sharp and aching that lasted about 30 minutes.  She took a tramadol and since the pain is nearly gone describes it as 1 out of 10 in severity.  Otherwise no reports of fevers or cough no vomiting or diarrhea.  No flank pain or back pain reported.  Patient discharged home stable condition advised outpatient follow-up with her doctor within the. Advised return for worsening pain fevers or any additional concerns.  11/17/21 1309: Pt called to determine if f/u appt needed. Pt states she feels better now & declines to schedule appt at this time. Pt advised that if symptoms return, she can schedule appt as needed. Pt verb understanding.

## 2021-11-18 ENCOUNTER — Ambulatory Visit: Payer: BC Managed Care – PPO

## 2021-11-19 DIAGNOSIS — F329 Major depressive disorder, single episode, unspecified: Secondary | ICD-10-CM | POA: Diagnosis not present

## 2021-11-20 ENCOUNTER — Telehealth: Payer: Self-pay | Admitting: Skilled Nursing Facility1

## 2021-11-20 NOTE — Telephone Encounter (Signed)
Pt called with some questions.   Dietitian answered pts questions to her satisfaction.   Pt states she has been suing the protein products for sweet cravings.

## 2021-11-25 DIAGNOSIS — F329 Major depressive disorder, single episode, unspecified: Secondary | ICD-10-CM | POA: Diagnosis not present

## 2021-11-26 ENCOUNTER — Ambulatory Visit: Admit: 2021-11-26 | Payer: BC Managed Care – PPO | Admitting: Obstetrics and Gynecology

## 2021-11-26 SURGERY — LAPAROSCOPIC GELPORT ASSISTED MYOMECTOMY
Anesthesia: General

## 2021-11-28 NOTE — Progress Notes (Signed)
12/01/2021 Lonn Georgia Fredric Dine [id number] Feb 12, 1991   ASSESSMENT AND PLAN:   Hepatic adenoma Repeat MRCP due 01/2022 Unknown if contributing to the AB pain Will evaluate for other causes of AB pain   Gastroesophageal reflux disease with esophagitis without hemorrhage Emphasized need to take PPI daily, had gastritis negative H pylori with prior EGD 05/2021,  will not schedule for EGD at this time but if pain continues will schedule to evaluate further.  Lifestyle changes discussed, avoid NSAIDS, ETOH  Dyspepsia Will schedule for HIDA to rule out GB as cause, if not likely gastritis, get on PPI daily.  Follow up, if not improved and HIDA negative consider repeat EGD  Status post gastric surgery Continue vitamins and weight loss  Iron deficiency anemia due to chronic blood loss Resolved with BCP  Fatty liver Continue weight loss   Patient Care Team: Billie Ruddy, MD as PCP - General (Family Medicine)  HISTORY OF PRESENT ILLNESS: 31 y.o. female referred by Billie Ruddy, MD, with a past medical history of hypothyroidism, morbid obesity status post gastric sleeve 06/2021, IDA secondary to menorrhagia and others listed below presents for evaluation of hepatic adenoma.   05/09/21 Endoscopy prior to the gastric sleeve Moderately severe esophagitis, no bleeding normal stomach, diffuse moderate inflammation in the first portion duodenum.  Pathology showed negative H. pylori, negative celiac.  gastroesophageal flap valve classified as Hill grade 2 09/12/2021 multiple solid masses noted on right upper quadrant ultrasound 10/14/2021 CT abdomen pelvis with indeterminate large liver masses and uterine fibroid, fatty liver 10/16/2021 MRI liver with bilobular arterial enhancing hepatic lesions up to 8.2 cm almost certainly benign hepatic adenomas or focal nodular hyperplasia. 10/13/2021 and 10/28/2021 patient seen in the ER for abdominal pain  11/03/2021 patient seen by general  surgery, discussed stopping Lupron and weight loss to help decrease adenoma size and reevaluate MRI 3 months, they do not start decreasing would remove surgically. Patient had another visit 11/14/2021 for suprapubic abdominal pain, repeat CT abdomen pelvis showed no changes in hepatic adenomas, showed gallbladder sludge.  Her transaminases and alk phos were mildly elevated on 12/9 but were normal on 10/13/21, I do not see repeat liver function since that time.  Last anemia panel 05/01/2021 showed slightly low B12, normal iron, patient's anemia has resolved since being on birth control pills..  Hemoglobin 11 with microcytosis a year ago, started on Lupron and birth control pills and this is since resolved  Had gastric sleeve 06/2021, then Dec 2022 while lying down she had severe epigastric AB pain after her gastric sleeve. No Nausea, vomiting, had AB bloating. Would come in waves, coming and going with radiation to her right shoulder. Then happened again 2 times monthly after, tramadol helped instantly.  Then Feb after urinating, had suprapubic/vaginal pain with shaking afterwards.   Had blood and protein in urine, negative pregnancy test.  She states she has not been taking the PPI, suggested to this daily.  No pain since that time in the ER.   She is in nursing school, interested in being NP/PA  External labs and notes reviewed this visit: CBC  10/13/2021  HGB 12.1 MCV 85.8 without evidence of anemia WBC 8.9 Platelets 342 Anemia panel 05/01/2021  Iron 49 Ferritin 19  05/01/2021  B12 242 Kidney function 10/13/2021  BUN 18 Cr 0.82  GFR >60  Potassium 3.9   LFTs 10/13/2021  AST 40 ALT 35 Alkphos 100 TBili 0.6 10/13/2021 LIPASE 54 10/01/2021 TSH 0.90  Current Medications:   Current Outpatient Medications (Endocrine & Metabolic):    levothyroxine (SYNTHROID) 125 MCG tablet, Take 125 mcg by mouth daily before breakfast.    Current Outpatient Medications (Analgesics):     HYDROcodone-acetaminophen (NORCO/VICODIN) 5-325 MG tablet, Take 1 tablet by mouth every 6 (six) hours as needed for moderate pain.   Current Outpatient Medications (Other):    calcium carbonate (OSCAL) 1500 (600 Ca) MG TABS tablet, Take 750 mg by mouth 3 (three) times daily.   LUPRON DEPOT, 65-MONTH, 3.75 MG injection, Inject 3.75 mg into the muscle every 28 (twenty-eight) days.   Multiple Vitamins-Minerals (WOMENS MULTIVITAMIN PO), Take 1 tablet by mouth daily. Bariatric MV  Medical History:  Past Medical History:  Diagnosis Date   Asthma    with allergy season   Diabetes mellitus, type II ([place])    Hgb A1c in 03/2019 was 6.8%   Fibroids    GERD (gastroesophageal reflux disease)    History of prediabetes    Hypothyroidism    Obesity    PCOS (polycystic ovarian syndrome)    Uterine leiomyoma    Vitamin D deficiency    Allergies: No Known Allergies   Surgical History:  She  has a past surgical history that includes Esophagogastroduodenoscopy (N/A, 05/09/2021); biopsy (05/09/2021); Wisdom tooth extraction (2011); Upper gi endoscopy (N/A, 07/15/2021); and Laparoscopic gastric sleeve resection (07/15/2021). Family History:  Her family history includes Hypertension in her maternal grandfather and mother; Stroke in her maternal grandfather; Thyroid disease in her father. Social History:   reports that she has never smoked. She has never used smokeless tobacco. She reports that she does not drink alcohol and does not use drugs.  REVIEW OF SYSTEMS  : All other systems reviewed and negative except where noted in the History of Present Illness.   PHYSICAL EXAM: BP 110/70    Pulse (!) 52    Ht 5' 2.5" (1.588 m)    Wt 276 lb (125.2 kg)    BMI 49.68 kg/m  General:   Pleasant, well developed female in no acute distress Head:  Normocephalic and atraumatic. Eyes: sclerae anicteric,conjunctive pink  Heart:  regular rate and rhythm, no murmurs or gallops Pulm: Clear anteriorly; no  wheezing Abdomen:  Soft, Obese AB, patient in jumper unable to evaluate skinNormal bowel sounds. mild tenderness in the epigastrium. Without guarding and Without rebound, without hepatomegaly. Extremities:  Without edema. Msk:  Symmetrical without gross deformities. Peripheral pulses intact.  Neurologic:  Alert and  oriented x4;  grossly normal neurologically. Skin:   Dry and intact without significant lesions or rashes. Psychiatric: Demonstrates good judgement and reason without abnormal affect or behaviors.   Vladimir Crofts, PA-C 9:11 AM

## 2021-12-01 ENCOUNTER — Ambulatory Visit: Payer: BC Managed Care – PPO | Admitting: Physician Assistant

## 2021-12-01 ENCOUNTER — Encounter: Payer: Self-pay | Admitting: Physician Assistant

## 2021-12-01 ENCOUNTER — Telehealth: Payer: Self-pay | Admitting: Skilled Nursing Facility1

## 2021-12-01 VITALS — BP 110/70 | HR 52 | Ht 62.5 in | Wt 276.0 lb

## 2021-12-01 DIAGNOSIS — D134 Benign neoplasm of liver: Secondary | ICD-10-CM

## 2021-12-01 DIAGNOSIS — K2100 Gastro-esophageal reflux disease with esophagitis, without bleeding: Secondary | ICD-10-CM

## 2021-12-01 DIAGNOSIS — Z9889 Other specified postprocedural states: Secondary | ICD-10-CM | POA: Diagnosis not present

## 2021-12-01 DIAGNOSIS — R1013 Epigastric pain: Secondary | ICD-10-CM

## 2021-12-01 DIAGNOSIS — K760 Fatty (change of) liver, not elsewhere classified: Secondary | ICD-10-CM | POA: Diagnosis not present

## 2021-12-01 DIAGNOSIS — D500 Iron deficiency anemia secondary to blood loss (chronic): Secondary | ICD-10-CM

## 2021-12-01 NOTE — Telephone Encounter (Signed)
Returned pt's call. LVM.

## 2021-12-01 NOTE — Patient Instructions (Addendum)
If you are age 29 or older, your body mass index should be between 23-30. Your Body mass index is 49.68 kg/m. If this is out of the aforementioned range listed, please consider follow up with your Primary Care Provider.  If you are age 40 or younger, your body mass index should be between 19-25. Your Body mass index is 49.68 kg/m. If this is out of the aformentioned range listed, please consider follow up with your Primary Care Provider.   ________________________________________________________  The [facility] providers would like to encourage you to use [facility] to communicate with providers for non-urgent requests or questions.  Due to long hold times on the telephone, sending your provider a message by [facility] may be a faster and more efficient way to get a response.  Please allow 48 business hours for a response.  Please remember that this is for non-urgent requests.  _______________________________________________________  Christine Nelson have been scheduled for a HIDA scan at [facility] on 12-08-2021. Please arrive 30 minutes prior to your scheduled appointment at  4:91PH. Make certain not to have anything to eat or drink at least 6 hours prior to your test. Should this appointment date or time not work well for you, please call radiology scheduling at [phone].  _____________________________________________________________________ hepatobiliary (HIDA) scan is an imaging procedure used to diagnose problems in the liver, gallbladder and bile ducts. In the HIDA scan, a radioactive chemical or tracer is injected into a vein in your arm. The tracer is handled by the liver like bile. Bile is a fluid produced and excreted by your liver that helps your digestive system break down fats in the foods you eat. Bile is stored in your gallbladder and the gallbladder releases the bile when you eat a meal. A special nuclear medicine scanner (gamma camera) tracks the flow of the tracer from your liver  into your gallbladder and small intestine.  During your HIDA scan  You'll be asked to change into a hospital gown before your HIDA scan begins. Your health care team will position you on a table, usually on your back. The radioactive tracer is then injected into a vein in your arm.The tracer travels through your bloodstream to your liver, where it's taken up by the bile-producing cells. The radioactive tracer travels with the bile from your liver into your gallbladder and through your bile ducts to your small intestine.You may feel some pressure while the radioactive tracer is injected into your vein. As you lie on the table, a special gamma camera is positioned over your abdomen taking pictures of the tracer as it moves through your body. The gamma camera takes pictures continually for about an hour. You'll need to keep still during the HIDA scan. This can become uncomfortable, but you may find that you can lessen the discomfort by taking deep breaths and thinking about other things. Tell your health care team if you're uncomfortable. The radiologist will watch on a computer the progress of the radioactive tracer through your body. The HIDA scan may be stopped when the radioactive tracer is seen in the gallbladder and enters your small intestine. This typically takes about an hour. In some cases extra imaging will be performed if original images aren't satisfactory, if morphine is given to help visualize the gallbladder or if the medication CCK is given to look at the contraction of the gallbladder. This test typically takes 2 hours to complete.  Follow up in 2-3 months after HIDA scan. ________________________________________________________________________   Silent reflux:  Not all heartburn burns...Marland KitchenMarland KitchenMarland Kitchen  What is LPR? Laryngopharyngeal reflux (LPR) or silent reflux is a condition in which acid that is made in the stomach travels up the esophagus (swallowing tube) and gets to the throat. Not everyone with  reflux has a lot of heartburn or indigestion. In fact, many people with LPR never have heartburn. This is why LPR is called SILENT REFLUX, and the terms "Silent reflux" and "LPR" are often used interchangeably. Because LPR is silent, it is sometimes difficult to diagnose.  How can you tell if you have LPR?  Chronic hoarseness- Some people have hoarseness that comes and goes throat clearing  Cough It can cause shortness of breath and cause asthma like symptoms. a feeling of a lump in the throat  difficulty swallowing a problem with too much nose and throat drainage.  Some people will feel their esophagus spasm which feels like their heart beating hard and fast, this will usually be after a meal, at rest, or lying down at night.    How do I treat this? Treatment for LPR should be individualized, and your doctor will suggest the best treatment for you. Generally there are several treatments for LPR: changing habits and diet to reduce reflux,  medications to reduce stomach acid, and  surgery to prevent reflux. Most people with LPR need to modify how and when they eat, as well as take some medication, to get well. Sometimes, nonprescription liquid antacids, such as Maalox, Gelucil and Mylanta are recommended. When used, these antacids should be taken four times each day - one tablespoon one hour after each meal and before bedtime. Dietary and lifestyle changes alone are not often enough to control LPR - medications that reduce stomach acid are also usually needed. These must be prescribed by our doctor.   TIPS FOR REDUCING REFLUX AND LPR Control your LIFE-STYLE and your DIET! If you use tobacco, QUIT.  Smoking makes you reflux. After every cigarette you have some LPR.  Don't wear clothing that is too tight, especially around the waist (trousers, corsets, belts).  Do not lie down just after eating...in fact, do not eat within three hours of bedtime.  You should be on a low-fat diet.  Limit  your intake of red meat.  Limit your intake of butter.  Avoid fried foods.  Avoid chocolate  Avoid cheese.  Avoid eggs. Specifically avoid caffeine (especially coffee and tea), soda pop (especially cola) and mints.  Avoid alcoholic beverages, particularly in the evening.

## 2021-12-02 ENCOUNTER — Other Ambulatory Visit (HOSPITAL_COMMUNITY): Payer: Self-pay

## 2021-12-02 DIAGNOSIS — F329 Major depressive disorder, single episode, unspecified: Secondary | ICD-10-CM | POA: Diagnosis not present

## 2021-12-02 NOTE — Progress Notes (Signed)
I agree with the above note, plan 

## 2021-12-03 DIAGNOSIS — N912 Amenorrhea, unspecified: Secondary | ICD-10-CM | POA: Diagnosis not present

## 2021-12-08 ENCOUNTER — Ambulatory Visit (HOSPITAL_COMMUNITY)
Admission: RE | Admit: 2021-12-08 | Discharge: 2021-12-08 | Disposition: A | Discharge status: Home / Self Care | Payer: BC Managed Care – PPO | Source: Ambulatory Visit | Attending: Physician Assistant | Admitting: Physician Assistant

## 2021-12-08 ENCOUNTER — Other Ambulatory Visit: Payer: Self-pay

## 2021-12-08 DIAGNOSIS — R1013 Epigastric pain: Secondary | ICD-10-CM | POA: Insufficient documentation

## 2021-12-09 ENCOUNTER — Ambulatory Visit (HOSPITAL_COMMUNITY)
Admission: RE | Admit: 2021-12-09 | Discharge: 2021-12-09 | Disposition: A | Discharge status: Home / Self Care | Payer: BC Managed Care – PPO | Source: Ambulatory Visit | Attending: Physician Assistant | Admitting: Physician Assistant

## 2021-12-09 DIAGNOSIS — R1013 Epigastric pain: Secondary | ICD-10-CM | POA: Insufficient documentation

## 2021-12-09 DIAGNOSIS — K8050 Calculus of bile duct without cholangitis or cholecystitis without obstruction: Secondary | ICD-10-CM | POA: Insufficient documentation

## 2021-12-09 DIAGNOSIS — K829 Disease of gallbladder, unspecified: Secondary | ICD-10-CM | POA: Diagnosis not present

## 2021-12-09 MED ORDER — TECHNETIUM TC 99M MEBROFENIN IV KIT
5.2000 | PACK | Freq: Once | INTRAVENOUS | Status: AC | PRN
  Administered 2021-12-09: 5.2 via INTRAVENOUS

## 2021-12-10 ENCOUNTER — Encounter: Payer: BC Managed Care – PPO | Attending: Surgery | Admitting: Skilled Nursing Facility1

## 2021-12-10 ENCOUNTER — Other Ambulatory Visit: Payer: Self-pay

## 2021-12-10 DIAGNOSIS — N939 Abnormal uterine and vaginal bleeding, unspecified: Secondary | ICD-10-CM | POA: Diagnosis not present

## 2021-12-10 DIAGNOSIS — F329 Major depressive disorder, single episode, unspecified: Secondary | ICD-10-CM | POA: Diagnosis not present

## 2021-12-10 DIAGNOSIS — Z6841 Body Mass Index (BMI) 40.0 and over, adult: Secondary | ICD-10-CM | POA: Diagnosis not present

## 2021-12-10 DIAGNOSIS — E66813 Obesity, class 3: Secondary | ICD-10-CM

## 2021-12-10 NOTE — Progress Notes (Signed)
Bariatric Nutrition Follow-Up Visit ?Medical Nutrition Therapy  ? ? ?NUTRITION ASSESSMENT ?  ? ?Surgery date: 07/15/2021 ?Surgery type: sleeve ?Start weight at [facility]: 331.7 pounds ?Weight today: 276.2 pounds ? ?Body Composition Scale 07/29/2021 08/27/2021 11/04/2021 12/10/2021  ?Current Body Weight 307.2 292.3 276.2 273.7  ?Total Body Fat % 49.1 48.1 47.4 47.2  ?Visceral Fat '16 15 15 14  '$ ?Fat-Free Mass % 50.8 51.8 52.5 52.7  ? Total Body Water % 39.9 40.4 40.7 40.8  ?Muscle-Mass lbs 33.2 33.1 32.3 32.3  ?BMI 54 51.4 49.8 49.3  ?Body Fat Displacement      ?       Torso  lbs 93.8 87.4 81.2 80.2  ?       Left Leg  lbs 18.7 17.4 16.2 16  ?       Right Leg  lbs 18.7 17.4 16.2 16  ?       Left Arm  lbs 9.3 8.7 8.1 8  ?       Right Arm   lbs 9.3 8.7 8.1 8  ? ?Clinical  ?Medical hx: PCOS, diabetes ?Medications: see list ?Labs: A1C 5.3 ?Notable signs/symptoms:  ?Any previous deficiencies? vitamin D ?  ?Lifestyle & Dietary Hx ? ?Pt states she does talk therpy weekly. ? ?Pt states she does NOT have cancer which she is happy about the lesions will shrink with weight loss. Pt states the liver specialist did not think the p[ain was from her liver and tested her gallbladder.  Pt states she has been taking the acid reflux medicine every day and has not has as much pain.  ?Pt states she has not had any loss of breath or pain.  ?Pt states she has been working on stopping at satisfaction not fullness.  ? ? ?Estimated daily fluid intake: 55+ oz ?Estimated daily protein intake: 60+ g ?Supplements: multi and calcium ?Current average weekly physical activity: BELT and an aerobic class once a week ? ?24-Hr Dietary Recall ?First Meal: protein shake or smoked [place] sausage ?Snack:  almonds or chicken salad cups or greek yogurt ?Second Meal: shrimp or chipotle chicken or chicken in a wrap (high fiber) or grilled shrimp high fiber taco + light sour cream and peppers ?Snack:  chicken salad or yogurt or almonds  ?Third Meal: shrimp or fish or  chicken fajita or chicken + mashed potatoes + vegetables  ?Snack:  ?Beverages: water, gatorade zero, water + flavoring, ocean spary 5 calorie juice  ? ?Post-Op Goals/ Signs/ Symptoms ?Using straws: no ?Drinking while eating: no ?Chewing/swallowing difficulties: no ?Changes in vision: no ?Changes to mood/headaches: no ?Hair loss/changes to skin/nails: no ?Difficulty focusing/concentrating: no ?Sweating: no ?Limb weakness: no ?Dizziness/lightheadedness: no ?Palpitations: no  ?Carbonated/caffeinated beverages: no ?N/V/D/C/Gas: no ?Abdominal pain: no ?Dumping syndrome: no ? ?  ?NUTRITION DIAGNOSIS  ?Overweight/obesity ([place]-3.3) related to past poor dietary habits and physical inactivity as evidenced by completed bariatric surgery and following dietary guidelines for continued weight loss and healthy nutrition status. ?  ?  ?NUTRITION INTERVENTION ?Nutrition counseling (C-1) and education (E-2) to facilitate bariatric surgery goals, including: ?The importance of consuming adequate calories as well as certain nutrients daily due to the body's need for essential vitamins, minerals, and fats ?The importance of daily physical activity and to reach a goal of at least 150 minutes of moderate to vigorous physical activity weekly (or as directed by their physician) due to benefits such as increased musculature and improved lab values ?The importance of intuitive eating specifically learning hunger-satiety cues and  understanding the importance of learning a new body: The importance of mindful eating to avoid grazing behaviors  ?Nutrition facts label education: repeated ?Plant based protien options  ?Encouraged patient to honor their body's internal hunger and fullness cues.  Throughout the day, check in mentally and rate hunger. Stop eating when satisfied not full regardless of how much food is left on the plate.  Get more if still hungry 20-30 minutes later.  The key is to honor satisfaction so throughout the meal, rate fullness  factor and stop when comfortably satisfied not physically full. The key is to honor hunger and fullness without any feelings of guilt or shame.  Pay attention to what the internal cues are, rather than any external factors. This will enhance the confidence you have in listening to your own body and following those internal cues enabling you to increase how often you eat when you are hungry not out of appetite and stop when you are satisfied not full.  ?Encouraged pt to continue to eat balanced meals inclusive of non starchy vegetables 2 times a day 7 days a week ?Encouraged pt to choose lean protein sources: limiting beef, pork, sausage, hotdogs, and lunch meat ?Encourage pt to choose healthy fats such as plant based limiting animal fats ?Encouraged pt to continue to drink a minium 64 fluid ounces with half being plain water to satisfy proper hydration ? ? ? ?Handouts Provided Include  ?Phase 5 ? ?Learning Style & Readiness for Change ?Teaching method utilized: Visual & Auditory  ?Demonstrated degree of understanding via: Teach Back  ?Readiness Level: Action ?Barriers to learning/adherence to lifestyle change: none identified  ? ?RD's Notes for Next Visit ?Assess adherence to pt chosen goals ? ? ? ?MONITORING & EVALUATION ?Dietary intake, weekly physical activity, body weight ? ?Next Steps ?Patient is to follow-up in 3-4 months ? ?

## 2021-12-13 ENCOUNTER — Ambulatory Visit (HOSPITAL_COMMUNITY): Payer: BC Managed Care – PPO

## 2021-12-16 DIAGNOSIS — F329 Major depressive disorder, single episode, unspecified: Secondary | ICD-10-CM | POA: Diagnosis not present

## 2021-12-23 DIAGNOSIS — F329 Major depressive disorder, single episode, unspecified: Secondary | ICD-10-CM | POA: Diagnosis not present

## 2021-12-31 ENCOUNTER — Other Ambulatory Visit: Payer: Self-pay | Admitting: Family Medicine

## 2021-12-31 ENCOUNTER — Ambulatory Visit: Payer: BC Managed Care – PPO | Admitting: Family Medicine

## 2021-12-31 VITALS — BP 101/65 | HR 76 | Temp 98.3°F | Wt 276.8 lb

## 2021-12-31 DIAGNOSIS — L309 Dermatitis, unspecified: Secondary | ICD-10-CM

## 2021-12-31 DIAGNOSIS — F329 Major depressive disorder, single episode, unspecified: Secondary | ICD-10-CM | POA: Diagnosis not present

## 2021-12-31 MED ORDER — HYDROCORTISONE VALERATE 0.2 % EX CREA: 1.0000 "application " | TOPICAL_CREAM | Freq: Two times a day (BID) | CUTANEOUS | 0 refills | Status: DC

## 2021-12-31 MED ORDER — CETIRIZINE HCL 10 MG PO TABS: 10.0000 mg | ORAL_TABLET | Freq: Every day | ORAL | 0 refills | Status: DC

## 2021-12-31 NOTE — Progress Notes (Signed)
Subjective:  ? ? Patient ID: Christine Nelson, female    DOB: 02-Nov-1990, 31 y.o.   MRN: [id number] ? ?Chief Complaint  ?Patient presents with  ? Rash  ?  Started on Monday, has bumps on arms, back, neck, butt -it itches. Has not changed any detergents. Not sure what it is or could be.  ? ? ?HPI ?Patient is a 31 yo female with pmh sig for h/o gastric bypass was seen today for acute concern.  Pt with pruritic rash on full body.  Started On Monday morning.  Pt denies changes in soaps, lotions, detergents, or foods.  Pt does not have pets.  Pt's husband is without rash.  Patient tried Benadryl without relief.  Patient denies recent travel, changes in meds.  Taking Synthroid first thing in the morning.  Then taking 1/2 a bariatric multivitamin and Protonix around lunchtime, then Calcium at 2, 3, and 4 pm followed by other 1/2 of MVI at dinner. ? ?Patient notes improvement in abdominal pain since taking Protonix. ? ?Past Medical History:  ?Diagnosis Date  ? Asthma   ? with allergy season  ? Diabetes mellitus, type II ([place])   ? Hgb A1c in 03/2019 was 6.8%  ? Fibroids   ? GERD (gastroesophageal reflux disease)   ? History of prediabetes   ? Hypothyroidism   ? Obesity   ? PCOS (polycystic ovarian syndrome)   ? Uterine leiomyoma   ? Vitamin D deficiency   ? ? ?No Known Allergies ? ?ROS ?General: Denies fever, chills, night sweats, changes in weight, changes in appetite ?HEENT: Denies headaches, ear pain, changes in vision, rhinorrhea, sore throat ?CV: Denies CP, palpitations, SOB, orthopnea ?Pulm: Denies SOB, cough, wheezing ?GI: Denies abdominal pain, nausea, vomiting, diarrhea, constipation ?GU: Denies dysuria, hematuria, frequency, vaginal discharge ?Msk: Denies muscle cramps, joint pains ?Neuro: Denies weakness, numbness, tingling ?Skin: Denies bruising +pruritic rash ?Psych: Denies depression, anxiety, hallucinations ?   ?Objective:  ?  ?Blood pressure 101/65, pulse 76, temperature 98.3 ?F (36.8 ?C), temperature source  Oral, weight 276 lb 12.8 oz (125.6 kg), SpO2 98 %. ? ? ?Gen. Pleasant, well-nourished, in no distress, normal affect   ?HEENT: [place]/AT, face symmetric, conjunctiva clear, no scleral icterus, PERRLA, EOMI, nares patent without drainage ?Lungs: no accessory muscle use, CTAB, no wheezes or rales ?Cardiovascular: RRR, no m/r/g, no peripheral edema ?Musculoskeletal: No deformities, no cyanosis or clubbing, normal tone ?Neuro:  A&Ox3, CN II-XII intact, normal gait ?Skin:  Warm, dry, intact.  Erythematous 1-2 mm raised macules and plaques on UEs, LES, back, neck, upper chest, forehead, upper back, and abdomen.  No lesions/burrows in webspaces. ? ? ?Wt Readings from Last 3 Encounters:  ?12/31/21 276 lb 12.8 oz (125.6 kg)  ?12/10/21 273 lb 11.2 oz (124.1 kg)  ?12/01/21 276 lb (125.2 kg)  ? ? ?Lab Results  ?Component Value Date  ? WBC 8.9 10/13/2021  ? HGB 12.1 10/13/2021  ? HCT 38.8 10/13/2021  ? PLT 342 10/13/2021  ? GLUCOSE 79 10/13/2021  ? CHOL 153 05/01/2021  ? TRIG 62.0 05/01/2021  ? HDL 41.10 05/01/2021  ? [place] 99 05/01/2021  ? ALT 35 10/13/2021  ? AST 40 10/13/2021  ? NA 139 10/13/2021  ? K 3.9 10/13/2021  ? CL 106 10/13/2021  ? CREATININE 0.82 10/13/2021  ? BUN 18 10/13/2021  ? CO2 25 10/13/2021  ? TSH 0.90 10/01/2021  ? HGBA1C 5.3 10/01/2021  ? ? ?Assessment/Plan: ? ?Dermatitis  ?-Nonspecific dermatitis ?-Discussed possible causes including chemicals, foods  contact dermatitis. ?-Patient encouraged to keep food diary ?-We will start OTC Zyrtec ?-Can also use Westcort cream as needed. ?-Can place referral to allergist for allergy testing for continued or worsening symptoms. ?-Given handout ?-Given precautions ?- Plan: hydrocortisone valerate cream (WESTCORT) 0.2 %, cetirizine (ZYRTEC) 10 MG tablet ? ?F/u as needed ? ?Grier Mitts, MD ?

## 2021-12-31 NOTE — Patient Instructions (Addendum)
A prescription for a steroid cream and zyrtec were sent to your pharmacy.  You can use the cream for itchy areas on your body.  Avoid use on face.  You can use regular over-the-counter 1% cortisone cream for your face if needed. ? ?If you are interested in allergy testing we can place a referral to the allergist for you. ? ?Notify clinic for worsening or continued symptoms in the next few days. ?

## 2022-01-01 ENCOUNTER — Other Ambulatory Visit: Payer: Self-pay | Admitting: Family Medicine

## 2022-01-01 DIAGNOSIS — L309 Dermatitis, unspecified: Secondary | ICD-10-CM

## 2022-01-01 MED ORDER — HYDROCORTISONE 2.5 % EX OINT: TOPICAL_OINTMENT | Freq: Two times a day (BID) | CUTANEOUS | 0 refills | Status: DC

## 2022-01-01 NOTE — Addendum Note (Signed)
Addended by: Anderson Malta on: 01/01/2022 11:57 AM ? ? Modules accepted: Orders ? ?

## 2022-01-12 DIAGNOSIS — D251 Intramural leiomyoma of uterus: Secondary | ICD-10-CM | POA: Diagnosis not present

## 2022-01-13 DIAGNOSIS — F329 Major depressive disorder, single episode, unspecified: Secondary | ICD-10-CM | POA: Diagnosis not present

## 2022-01-16 DIAGNOSIS — D259 Leiomyoma of uterus, unspecified: Secondary | ICD-10-CM | POA: Diagnosis not present

## 2022-01-20 DIAGNOSIS — F329 Major depressive disorder, single episode, unspecified: Secondary | ICD-10-CM | POA: Diagnosis not present

## 2022-01-24 ENCOUNTER — Other Ambulatory Visit: Payer: Self-pay | Admitting: Family Medicine

## 2022-01-24 DIAGNOSIS — L309 Dermatitis, unspecified: Secondary | ICD-10-CM

## 2022-01-28 ENCOUNTER — Other Ambulatory Visit: Payer: Self-pay | Admitting: Obstetrics and Gynecology

## 2022-01-28 DIAGNOSIS — F329 Major depressive disorder, single episode, unspecified: Secondary | ICD-10-CM | POA: Diagnosis not present

## 2022-02-11 ENCOUNTER — Encounter (HOSPITAL_BASED_OUTPATIENT_CLINIC_OR_DEPARTMENT_OTHER): Payer: Self-pay

## 2022-02-11 ENCOUNTER — Other Ambulatory Visit: Payer: Self-pay

## 2022-02-11 ENCOUNTER — Emergency Department (HOSPITAL_BASED_OUTPATIENT_CLINIC_OR_DEPARTMENT_OTHER)
Admission: EM | Admit: 2022-02-11 | Discharge: 2022-02-12 | Disposition: A | Discharge status: Home / Self Care | Payer: BC Managed Care – PPO | Attending: Emergency Medicine | Admitting: Emergency Medicine

## 2022-02-11 DIAGNOSIS — E119 Type 2 diabetes mellitus without complications: Secondary | ICD-10-CM | POA: Diagnosis not present

## 2022-02-11 DIAGNOSIS — J45909 Unspecified asthma, uncomplicated: Secondary | ICD-10-CM | POA: Diagnosis not present

## 2022-02-11 DIAGNOSIS — R1013 Epigastric pain: Secondary | ICD-10-CM | POA: Diagnosis not present

## 2022-02-11 DIAGNOSIS — Z79899 Other long term (current) drug therapy: Secondary | ICD-10-CM | POA: Insufficient documentation

## 2022-02-11 DIAGNOSIS — E039 Hypothyroidism, unspecified: Secondary | ICD-10-CM | POA: Diagnosis not present

## 2022-02-11 LAB — CBC WITH DIFFERENTIAL/PLATELET
Abs Immature Granulocytes: 0.02 10*3/uL (ref 0.00–0.07)
Basophils Absolute: 0 10*3/uL (ref 0.0–0.1)
Basophils Relative: 0 %
Eosinophils Absolute: 0.1 10*3/uL (ref 0.0–0.5)
Eosinophils Relative: 1 %
HCT: 39.3 % (ref 36.0–46.0)
Hemoglobin: 12.2 g/dL (ref 12.0–15.0)
Immature Granulocytes: 0 %
Lymphocytes Relative: 44 %
Lymphs Abs: 4.2 10*3/uL — ABNORMAL HIGH (ref 0.7–4.0)
MCH: 26.8 pg (ref 26.0–34.0)
MCHC: 31 g/dL (ref 30.0–36.0)
MCV: 86.2 fL (ref 80.0–100.0)
Monocytes Absolute: 0.4 10*3/uL (ref 0.1–1.0)
Monocytes Relative: 4 %
Neutro Abs: 4.9 10*3/uL (ref 1.7–7.7)
Neutrophils Relative %: 51 %
Platelets: 323 10*3/uL (ref 150–400)
RBC: 4.56 MIL/uL (ref 3.87–5.11)
RDW: 14.6 % (ref 11.5–15.5)
WBC: 9.5 10*3/uL (ref 4.0–10.5)
nRBC: 0 % (ref 0.0–0.2)

## 2022-02-11 LAB — COMPREHENSIVE METABOLIC PANEL
ALT: 19 U/L (ref 0–44)
AST: 17 U/L (ref 15–41)
Albumin: 4.2 g/dL (ref 3.5–5.0)
Alkaline Phosphatase: 107 U/L (ref 38–126)
Anion gap: 7 (ref 5–15)
BUN: 18 mg/dL (ref 6–20)
CO2: 31 mmol/L (ref 22–32)
Calcium: 9.6 mg/dL (ref 8.9–10.3)
Chloride: 104 mmol/L (ref 98–111)
Creatinine, Ser: 1.11 mg/dL — ABNORMAL HIGH (ref 0.44–1.00)
GFR, Estimated: >60 mL/min (ref >60)
Glucose, Bld: 94 mg/dL (ref 70–99)
Potassium: 4.2 mmol/L (ref 3.5–5.1)
Sodium: 142 mmol/L (ref 135–145)
Total Bilirubin: 0.4 mg/dL (ref 0.3–1.2)
Total Protein: 7.3 g/dL (ref 6.5–8.1)

## 2022-02-11 LAB — PREGNANCY, URINE: Preg Test, Ur: NEGATIVE

## 2022-02-11 LAB — URINALYSIS, ROUTINE W REFLEX MICROSCOPIC
Bilirubin Urine: NEGATIVE
Glucose, UA: NEGATIVE mg/dL
Hgb urine dipstick: NEGATIVE
Ketones, ur: NEGATIVE mg/dL
Leukocytes,Ua: NEGATIVE
Nitrite: NEGATIVE
Protein, ur: NEGATIVE mg/dL
Specific Gravity, Urine: 1.013 (ref 1.005–1.030)
pH: 6.5 (ref 5.0–8.0)

## 2022-02-11 LAB — LIPASE, BLOOD: Lipase: 111 U/L — ABNORMAL HIGH (ref 11–51)

## 2022-02-11 NOTE — ED Triage Notes (Signed)
Pt arrives POV with c/o epigastric pain starting earlier this evening. ? ?She has had similar pain in the past and was diagnosed with acid reflux. ? ?She takes acid reflux medication daily which normally controls the symptoms. ? ?She took an extra acid reflux medication PTA without any significant relief. ? ?She says the pain was radiating to the right shoulder, but this has resolved. ? ?Ambulatory to triage, in NAD. ?

## 2022-02-11 NOTE — Discharge Instructions (Signed)

## 2022-02-12 NOTE — ED Notes (Signed)
Pt verbalizes understanding of discharge instructions. Opportunity for questioning and answers were provided. Pt discharged from ED to home.   ? ?

## 2022-02-12 NOTE — ED Provider Notes (Signed)
?[facility] ?Provider Note ? ? ?CSN: [id number] ?Arrival date & time: 02/11/22  2126 ? ?  ? ?History ? ?Chief Complaint  ?Patient presents with  ? Abdominal Pain  ? ? ?Christine Nelson is a 31 y.o. female. ? ?The history is provided by the patient.  ?Abdominal Pain ?Pain location:  Epigastric ?Pain quality: sharp   ?Pain radiates to:  R shoulder ?Pain severity:  Moderate ?Onset quality:  Gradual ?Timing:  Constant ?Progression:  Improving ?Chronicity:  Recurrent ?Context: not alcohol use   ?Worsened by:  Movement and palpation ?Associated symptoms: no chest pain, no diarrhea, no dysuria, no fever, no hematochezia, no vaginal bleeding and no vomiting   ?Risk factors: obesity   ?Risk factors: no alcohol abuse, no NSAID use and not pregnant   ?Patient with history of obesity, PCOS, GERD presents with abdominal pain.  She reports earlier in the evening she began having epigastric abdominal pain that radiated to her right shoulder.  She has had this previously.  She is now feeling improved after taking her PPI and a tramadol.  No fevers or vomiting.  Patient has had extensive evaluation in the past. ?  ?Past Medical History:  ?Diagnosis Date  ? Asthma   ? with allergy season  ? Diabetes mellitus, type II ([place])   ? Hgb A1c in 03/2019 was 6.8%  ? Fibroids   ? GERD (gastroesophageal reflux disease)   ? History of prediabetes   ? Hypothyroidism   ? Obesity   ? PCOS (polycystic ovarian syndrome)   ? Uterine leiomyoma   ? Vitamin D deficiency   ? ? ?Home Medications ?Prior to Admission medications   ?Medication Sig Start Date End Date Taking? Authorizing Provider  ?calcium carbonate (OSCAL) 1500 (600 Ca) MG TABS tablet Take 750 mg by mouth 3 (three) times daily.    [provider]  ?cetirizine (ZYRTEC) 10 MG tablet TAKE 1 TABLET BY MOUTH EVERY DAY 01/26/22   Billie Ruddy, MD  ?hydrocortisone 2.5 % ointment Apply topically 2 (two) times daily. 01/01/22   Billie Ruddy, MD   ?levothyroxine (SYNTHROID) 125 MCG tablet Take 125 mcg by mouth daily before breakfast. 01/08/20   [provider]  ?LUPRON DEPOT, 20-MONTH, 3.75 MG injection Inject 3.75 mg into the muscle every 28 (twenty-eight) days. 11/08/20   [provider]  ?Multiple Vitamins-Minerals (WOMENS MULTIVITAMIN PO) Take 1 tablet by mouth daily. Bariatric MV    [provider]  ?   ? ?Allergies    ?Patient has no known allergies.   ? ?Review of Systems   ?Review of Systems  ?Constitutional:  Negative for fever.  ?Cardiovascular:  Negative for chest pain.  ?Gastrointestinal:  Positive for abdominal pain. Negative for diarrhea, hematochezia and vomiting.  ?Genitourinary:  Negative for dysuria and vaginal bleeding.  ? ?Physical Exam ?Updated Vital Signs ?BP 125/77 (BP Location: Right Arm)   Pulse (!) 52   Temp 97.6 ?F (36.4 ?C) (Oral)   Resp 18   Ht 1.575 m ('5\' 2"'$ )   Wt 121.6 kg   SpO2 98%   BMI 49.02 kg/m?  ?Physical Exam ?CONSTITUTIONAL: Well developed/well nourished, no distress ?HEAD: Normocephalic/atraumatic ?EYES: EOMI/PERRL, no icterus ?ENMT: Mucous membranes moist ?NECK: supple no meningeal signs ?SPINE/BACK:entire spine nontender ?CV: S1/S2 noted, no murmurs/rubs/gallops noted ?LUNGS: Lungs are clear to auscultation bilaterally, no apparent distress ?ABDOMEN: soft, nontender, no rebound or guarding, bowel sounds noted throughout abdomen ?GU:no cva tenderness ?NEURO: Pt is awake/alert/appropriate, moves all extremitiesx4.  No facial droop.   ?EXTREMITIES: pulses normal/equal, full ROM ?SKIN: warm, color normal ?PSYCH: no abnormalities of mood noted, alert and oriented to situation ? ?ED Results / Procedures / Treatments   ?Labs ?(all labs ordered are listed, but only abnormal results are displayed) ?Labs Reviewed  ?COMPREHENSIVE METABOLIC PANEL - Abnormal; Notable for the following components:  ?    Result Value  ? Creatinine, Ser 1.11 (*)   ? All other components within normal limits  ?LIPASE,  BLOOD - Abnormal; Notable for the following components:  ? Lipase 111 (*)   ? All other components within normal limits  ?CBC WITH DIFFERENTIAL/PLATELET - Abnormal; Notable for the following components:  ? Lymphs Abs 4.2 (*)   ? All other components within normal limits  ?URINALYSIS, ROUTINE W REFLEX MICROSCOPIC  ?PREGNANCY, URINE  ? ? ?EKG ?None ? ?Radiology ?No results found. ? ?Procedures ?Procedures  ? ? ?Medications Ordered in ED ?Medications - No data to display ? ?ED Course/ Medical Decision Making/ A&P ?  ?                        ?Medical Decision Making ?Amount and/or Complexity of Data Reviewed ?Labs: ordered. ? ? ?This patient presents to the ED for concern of abdominal pain, this involves an extensive number of treatment options, and is a complaint that carries with it a high risk of complications and morbidity.  The differential diagnosis includes but is not limited to cholecystitis, cholelithiasis, pancreatitis, bowel obstruction, pancreatitis, bowel perforation, gastritis ? ?Comorbidities that complicate the patient evaluation: ?Patient?s presentation is complicated by their history of obesity and gastric bypass ? ?Additional history obtained: ?Records reviewed  previous GI notes reviewed ? ?Lab Tests: ?I Ordered, and personally interpreted labs.  The pertinent results include: Mild renal insufficiency likely due to dehydration ? ?Test Considered: ?Considered CT imaging of her abdomen/pelvis, patient is feeling improved ? ?Complexity of problems addressed: ?Patient?s presentation is most consistent with  acute presentation with potential threat to life or bodily function ? ?Disposition: ?After consideration of the diagnostic results and the patient?s response to treatment,  ?I feel that the patent would benefit from discharge   .  ? ?Patient presented with recurrent upper abdominal pain that raise to her shoulder.  By the time of my evaluation her pain is nearly resolved.  She has undergone extensive  evaluation including HIDA scan, CT abdomen pelvis, MRI liver.  She is also been seen by gastroenterology.  Has been deemed that these episodes are likely due to gastritis, and she has been maintaining daily use of PPI and dietary restrictions.  She does not drink alcohol.  She reports this feels exactly like the previous episodes that have resolved.  On my exam she has no focal abdominal tenderness is well-appearing.  She declines further work-up or medications and is requesting discharge home.  She will continue her PPI and follow-up as an outpatient. ?She has had mild elevation of lipase in the past, this is likely nonacute and low suspicion for acute pancreatitis ? ? ? ? ? ? ?Final Clinical Impression(s) / ED Diagnoses ?Final diagnoses:  ?Epigastric abdominal pain  ? ? ?Rx / DC Orders ?ED Discharge Orders   ? ? None  ? ?  ? ? ?  ?Ripley Fraise, MD ?02/12/22 [phone] ? ?

## 2022-02-13 ENCOUNTER — Other Ambulatory Visit: Payer: Self-pay | Admitting: Surgery

## 2022-02-13 DIAGNOSIS — K7689 Other specified diseases of liver: Secondary | ICD-10-CM

## 2022-02-17 ENCOUNTER — Ambulatory Visit
Admission: RE | Admit: 2022-02-17 | Discharge: 2022-02-17 | Disposition: A | Discharge status: Home / Self Care | Payer: BC Managed Care – PPO | Source: Ambulatory Visit | Attending: Surgery | Admitting: Surgery

## 2022-02-17 DIAGNOSIS — K7689 Other specified diseases of liver: Secondary | ICD-10-CM

## 2022-02-17 DIAGNOSIS — K760 Fatty (change of) liver, not elsewhere classified: Secondary | ICD-10-CM | POA: Diagnosis not present

## 2022-02-17 MED ORDER — GADOBENATE DIMEGLUMINE 529 MG/ML IV SOLN
20.0000 mL | Freq: Once | INTRAVENOUS | Status: AC | PRN
  Administered 2022-02-17: 20 mL via INTRAVENOUS

## 2022-02-18 ENCOUNTER — Other Ambulatory Visit: Payer: Self-pay | Admitting: Family Medicine

## 2022-02-18 DIAGNOSIS — F329 Major depressive disorder, single episode, unspecified: Secondary | ICD-10-CM | POA: Diagnosis not present

## 2022-02-18 DIAGNOSIS — L309 Dermatitis, unspecified: Secondary | ICD-10-CM

## 2022-02-19 DIAGNOSIS — D259 Leiomyoma of uterus, unspecified: Secondary | ICD-10-CM | POA: Diagnosis not present

## 2022-02-25 ENCOUNTER — Other Ambulatory Visit: Payer: Self-pay

## 2022-02-25 DIAGNOSIS — F329 Major depressive disorder, single episode, unspecified: Secondary | ICD-10-CM | POA: Diagnosis not present

## 2022-02-25 NOTE — Progress Notes (Signed)
The proposed treatment discussed in conference is for discussion purpose only and is not a binding recommendation.  The patients have not been physically examined, or presented with their treatment options.  Therefore, final treatment plans cannot be decided.  

## 2022-02-27 ENCOUNTER — Ambulatory Visit (INDEPENDENT_AMBULATORY_CARE_PROVIDER_SITE_OTHER): Payer: BC Managed Care – PPO | Admitting: Family Medicine

## 2022-02-27 ENCOUNTER — Encounter: Payer: Self-pay | Admitting: Family Medicine

## 2022-02-27 VITALS — BP 120/60 | HR 76 | Temp 98.1°F | Ht 62.0 in | Wt 267.0 lb

## 2022-02-27 DIAGNOSIS — M545 Low back pain, unspecified: Secondary | ICD-10-CM | POA: Diagnosis not present

## 2022-02-27 DIAGNOSIS — R195 Other fecal abnormalities: Secondary | ICD-10-CM | POA: Diagnosis not present

## 2022-02-27 DIAGNOSIS — Z903 Acquired absence of stomach [part of]: Secondary | ICD-10-CM | POA: Diagnosis not present

## 2022-02-27 DIAGNOSIS — L309 Dermatitis, unspecified: Secondary | ICD-10-CM

## 2022-02-27 NOTE — Progress Notes (Unsigned)
Subjective:    Patient ID: Christine Nelson, female    DOB: 1991-03-12, 31 y.o.   MRN: [id number]  Chief Complaint  Patient presents with   Back Pain    HPI Patient was seen today for acute concern.  Patient endorses midline low back pain x1 week.  Symptoms started after patient developed loose stools while trying to attend a wedding.  Patient states she later developed constipation.  She woke up in the middle of the night with an acid taste in her mouth and the sensation of needing to vomit.  Patient called bariatric surgeon, MiraLAX suggested.  Mira[facility]X because loose stools to return.  Noticed change in stool color but was unsure if caused by drinking bread Crystal light.  Loose stools have since resolved.  Patient also notes feeling extremely hot, seeing stars/presyncopal with exercise.  Pt with a peeling area of skin on right shoulder.  Past Medical History:  Diagnosis Date   Asthma    with allergy season   Diabetes mellitus, type II ([place])    Hgb A1c in 03/2019 was 6.8%   Fibroids    GERD (gastroesophageal reflux disease)    History of prediabetes    Hypothyroidism    Obesity    PCOS (polycystic ovarian syndrome)    Uterine leiomyoma    Vitamin D deficiency     No Known Allergies  ROS General: Denies fever, chills, night sweats, changes in weight, changes in appetite  + presyncope, feeling hot HEENT: Denies headaches, ear pain, changes in vision, rhinorrhea, sore throat CV: Denies CP, palpitations, SOB, orthopnea Pulm: Denies SOB, cough, wheezing GI: Denies abdominal pain, nausea, vomiting, +diarrhea, constipation GU: Denies dysuria, hematuria, frequency, vaginal discharge Msk: Denies muscle cramps, joint pains Neuro: Denies weakness, numbness, tingling Skin: Denies rashes, bruising Psych: Denies depression, anxiety, hallucinations      Objective:    Blood pressure 120/60, pulse 76, temperature 98.1 F (36.7 C), temperature source Oral, height '5\' 2"'$  (1.575 m),  weight 267 lb (121.1 kg), SpO2 99 %.   Gen. Pleasant, well-nourished, in no distress, normal affect  *** HEENT: [place]/AT, face symmetric, conjunctiva clear, no scleral icterus, PERRLA, EOMI, nares patent without drainage, pharynx without erythema or exudate. Neck: No JVD, no thyromegaly, no carotid bruits Lungs: no accessory muscle use, CTAB, no wheezes or rales Cardiovascular: RRR, no m/r/g, no ***peripheral edema Abdomen: BS present, soft, NT/ND, no hepatosplenomegaly. Musculoskeletal: No deformities, no cyanosis or clubbing, normal tone Neuro:  A&Ox3, CN II-XII intact, normal gait Skin:  Warm, no lesions/ rash   Wt Readings from Last 3 Encounters:  02/27/22 267 lb (121.1 kg)  02/11/22 268 lb (121.6 kg)  12/31/21 276 lb 12.8 oz (125.6 kg)    Lab Results  Component Value Date   WBC 9.5 02/11/2022   HGB 12.2 02/11/2022   HCT 39.3 02/11/2022   PLT 323 02/11/2022   GLUCOSE 94 02/11/2022   CHOL 153 05/01/2021   TRIG 62.0 05/01/2021   HDL 41.10 05/01/2021   LDLCALC 99 05/01/2021   ALT 19 02/11/2022   AST 17 02/11/2022   NA 142 02/11/2022   K 4.2 02/11/2022   CL 104 02/11/2022   CREATININE 1.11 (H) 02/11/2022   BUN 18 02/11/2022   CO2 31 02/11/2022   TSH 0.90 10/01/2021   HGBA1C 5.3 10/01/2021    Assessment/Plan:  Acute midline low back pain without sciatica  Loose stools  Dermatitis  F/u prn  Grier Mitts, MD

## 2022-03-03 ENCOUNTER — Ambulatory Visit: Payer: BC Managed Care – PPO | Admitting: Allergy & Immunology

## 2022-03-03 ENCOUNTER — Other Ambulatory Visit: Payer: Self-pay | Admitting: Family Medicine

## 2022-03-03 DIAGNOSIS — M62830 Muscle spasm of back: Secondary | ICD-10-CM | POA: Diagnosis not present

## 2022-03-03 DIAGNOSIS — F329 Major depressive disorder, single episode, unspecified: Secondary | ICD-10-CM | POA: Diagnosis not present

## 2022-03-03 DIAGNOSIS — M9901 Segmental and somatic dysfunction of cervical region: Secondary | ICD-10-CM | POA: Diagnosis not present

## 2022-03-03 DIAGNOSIS — M545 Low back pain, unspecified: Secondary | ICD-10-CM | POA: Diagnosis not present

## 2022-03-03 DIAGNOSIS — M9903 Segmental and somatic dysfunction of lumbar region: Secondary | ICD-10-CM | POA: Diagnosis not present

## 2022-03-03 DIAGNOSIS — L309 Dermatitis, unspecified: Secondary | ICD-10-CM

## 2022-03-04 DIAGNOSIS — K7689 Other specified diseases of liver: Secondary | ICD-10-CM | POA: Diagnosis not present

## 2022-03-05 DIAGNOSIS — Z903 Acquired absence of stomach [part of]: Secondary | ICD-10-CM | POA: Diagnosis not present

## 2022-03-05 DIAGNOSIS — E569 Vitamin deficiency, unspecified: Secondary | ICD-10-CM | POA: Diagnosis not present

## 2022-03-10 ENCOUNTER — Ambulatory Visit: Payer: BC Managed Care – PPO | Admitting: Skilled Nursing Facility1

## 2022-03-10 DIAGNOSIS — F329 Major depressive disorder, single episode, unspecified: Secondary | ICD-10-CM | POA: Diagnosis not present

## 2022-03-19 DIAGNOSIS — F329 Major depressive disorder, single episode, unspecified: Secondary | ICD-10-CM | POA: Diagnosis not present

## 2022-03-20 DIAGNOSIS — D259 Leiomyoma of uterus, unspecified: Secondary | ICD-10-CM | POA: Diagnosis not present

## 2022-03-24 DIAGNOSIS — F329 Major depressive disorder, single episode, unspecified: Secondary | ICD-10-CM | POA: Diagnosis not present

## 2022-03-31 ENCOUNTER — Other Ambulatory Visit: Payer: Self-pay | Admitting: Family Medicine

## 2022-03-31 ENCOUNTER — Ambulatory Visit: Payer: BC Managed Care – PPO | Admitting: Skilled Nursing Facility1

## 2022-03-31 DIAGNOSIS — F329 Major depressive disorder, single episode, unspecified: Secondary | ICD-10-CM | POA: Diagnosis not present

## 2022-03-31 DIAGNOSIS — K219 Gastro-esophageal reflux disease without esophagitis: Secondary | ICD-10-CM

## 2022-04-01 ENCOUNTER — Other Ambulatory Visit: Payer: Self-pay | Admitting: Family Medicine

## 2022-04-01 DIAGNOSIS — K219 Gastro-esophageal reflux disease without esophagitis: Secondary | ICD-10-CM

## 2022-04-03 ENCOUNTER — Other Ambulatory Visit: Payer: Self-pay | Admitting: Family Medicine

## 2022-04-03 DIAGNOSIS — K219 Gastro-esophageal reflux disease without esophagitis: Secondary | ICD-10-CM | POA: Insufficient documentation

## 2022-04-03 MED ORDER — PANTOPRAZOLE SODIUM 20 MG PO TBEC: 20.0000 mg | DELAYED_RELEASE_TABLET | Freq: Every day | ORAL | 1 refills | Status: DC

## 2022-04-08 NOTE — Pre-Procedure Instructions (Signed)
Surgical Instructions    Your procedure is scheduled on Wednesday, July 12th.  Report to [facility] Main Entrance "A" at 11:30 A.M., then check in with the Admitting office.  Call this number if you have problems the morning of surgery:  [phone]   If you have any questions prior to your surgery date call [phone]: Open Monday-Friday 8am-4pm    Remember:  Do not eat after midnight the night before your surgery  You may drink clear liquids until 10:30 AM the morning of your surgery.   Clear liquids allowed are: Water, Non-Citrus Juices (without pulp), Carbonated Beverages, Clear Tea, Black Coffee Only (NO MILK, CREAM OR POWDERED CREAMER of any kind), and Gatorade.    Take these medicines the morning of surgery with A SIP OF WATER  levothyroxine (SYNTHROID)  pantoprazole (PROTONIX)   If needed: albuterol (VENTOLIN HFA)- if needed, bring with you on day of surgery cetirizine (ZYRTEC)  traMADol (ULTRAM)  As of today, STOP taking any Aspirin (unless otherwise instructed by your surgeon) Aleve, Naproxen, Ibuprofen, Motrin, Advil, Goody's, BC's, all herbal medications, fish oil, and all vitamins.                     Do NOT Smoke (Tobacco/Vaping) for 24 hours prior to your procedure.  If you use a CPAP at night, you may bring your mask/headgear for your overnight stay.   Contacts, glasses, piercing's, hearing aid's, dentures or partials may not be worn into surgery, please bring cases for these belongings.    For patients admitted to the hospital, discharge time will be determined by your treatment team.   Patients discharged the day of surgery will not be allowed to drive home, and someone needs to stay with them for 24 hours.  SURGICAL WAITING ROOM VISITATION Patients having surgery or a procedure may have two support people in the waiting room. These visitors may be switched out with other visitors if needed. Children under the age of 34 must have an adult accompany them  who is not the patient. If the patient needs to stay at the hospital during part of their recovery, the visitor guidelines for inpatient rooms apply.  Please refer to the [facility] website for the visitor guidelines for Inpatients (after your surgery is over and you are in a regular room).    Special instructions:   [place]- Preparing For Surgery  Before surgery, you can play an important role. Because skin is not sterile, your skin needs to be as free of germs as possible. You can reduce the number of germs on your skin by washing with CHG (chlorahexidine gluconate) Soap before surgery.  CHG is an antiseptic cleaner which kills germs and bonds with the skin to continue killing germs even after washing.    Oral Hygiene is also important to reduce your risk of infection.  Remember - BRUSH YOUR TEETH THE MORNING OF SURGERY WITH YOUR REGULAR TOOTHPASTE  Please do not use if you have an allergy to CHG or antibacterial soaps. If your skin becomes reddened/irritated stop using the CHG.  Do not shave (including legs and underarms) for at least 48 hours prior to first CHG shower. It is OK to shave your face.  Please follow these instructions carefully.   Shower the NIGHT BEFORE SURGERY and the MORNING OF SURGERY  If you chose to wash your hair, wash your hair first as usual with your normal shampoo.  After you shampoo, rinse your hair and body thoroughly to  remove the shampoo.  Use CHG Soap as you would any other liquid soap. You can apply CHG directly to the skin and wash gently with a scrungie or a clean washcloth.   Apply the CHG Soap to your body ONLY FROM THE NECK DOWN.  Do not use on open wounds or open sores. Avoid contact with your eyes, ears, mouth and genitals (private parts). Wash Face and genitals (private parts)  with your normal soap.   Wash thoroughly, paying special attention to the area where your surgery will be performed.  Thoroughly rinse your body with warm water from  the neck down.  DO NOT shower/wash with your normal soap after using and rinsing off the CHG Soap.  Pat yourself dry with a CLEAN TOWEL.  Wear CLEAN PAJAMAS to bed the night before surgery  Place CLEAN SHEETS on your bed the night before your surgery  DO NOT SLEEP WITH PETS.   Day of Surgery: Take a shower with CHG soap. Do not wear jewelry or makeup Do not wear lotions, powders, perfumes/colognes, or deodorant. Do not shave 48 hours prior to surgery.  Men may shave face and neck. Do not bring valuables to the hospital.  [facility] is not responsible for any belongings or valuables. Do not wear nail polish, gel polish, artificial nails, or any other type of covering on natural nails (fingers and toes) If you have artificial nails or gel coating that need to be removed by a nail salon, please have this removed prior to surgery. Artificial nails or gel coating may interfere with anesthesia's ability to adequately monitor your vital signs. Wear Clean/Comfortable clothing the morning of surgery Remember to brush your teeth WITH YOUR REGULAR TOOTHPASTE.   Please read over the following fact sheets that you were given.    If you received a COVID test during your pre-op visit  it is requested that you wear a mask when out in public, stay away from anyone that may not be feeling well and notify your surgeon if you develop symptoms. If you have been in contact with anyone that has tested positive in the last 10 days please notify you surgeon.

## 2022-04-09 ENCOUNTER — Encounter (HOSPITAL_COMMUNITY)
Admission: RE | Admit: 2022-04-09 | Discharge: 2022-04-09 | Disposition: A | Discharge status: Home / Self Care | Payer: BC Managed Care – PPO | Source: Ambulatory Visit | Attending: Obstetrics and Gynecology | Admitting: Obstetrics and Gynecology

## 2022-04-09 ENCOUNTER — Other Ambulatory Visit: Payer: Self-pay

## 2022-04-09 ENCOUNTER — Encounter (HOSPITAL_COMMUNITY): Payer: Self-pay

## 2022-04-09 VITALS — BP 120/74 | HR 69 | Temp 98.1°F | Resp 18 | Ht 63.0 in | Wt 277.2 lb

## 2022-04-09 DIAGNOSIS — Z01818 Encounter for other preprocedural examination: Secondary | ICD-10-CM

## 2022-04-09 DIAGNOSIS — Z01812 Encounter for preprocedural laboratory examination: Secondary | ICD-10-CM | POA: Diagnosis not present

## 2022-04-09 DIAGNOSIS — D251 Intramural leiomyoma of uterus: Secondary | ICD-10-CM | POA: Diagnosis not present

## 2022-04-09 HISTORY — DX: Hepatomegaly, not elsewhere classified: R16.0

## 2022-04-09 LAB — CBC
HCT: 41.4 % (ref 36.0–46.0)
Hemoglobin: 12.9 g/dL (ref 12.0–15.0)
MCH: 27.5 pg (ref 26.0–34.0)
MCHC: 31.2 g/dL (ref 30.0–36.0)
MCV: 88.3 fL (ref 80.0–100.0)
Platelets: 322 10*3/uL (ref 150–400)
RBC: 4.69 MIL/uL (ref 3.87–5.11)
RDW: 14.3 % (ref 11.5–15.5)
WBC: 7.5 10*3/uL (ref 4.0–10.5)
nRBC: 0 % (ref 0.0–0.2)

## 2022-04-09 LAB — COMPREHENSIVE METABOLIC PANEL
ALT: 27 U/L (ref 0–44)
AST: 21 U/L (ref 15–41)
Albumin: 3.4 g/dL — ABNORMAL LOW (ref 3.5–5.0)
Alkaline Phosphatase: 103 U/L (ref 38–126)
Anion gap: 7 (ref 5–15)
BUN: 13 mg/dL (ref 6–20)
CO2: 28 mmol/L (ref 22–32)
Calcium: 9 mg/dL (ref 8.9–10.3)
Chloride: 106 mmol/L (ref 98–111)
Creatinine, Ser: 0.93 mg/dL (ref 0.44–1.00)
GFR, Estimated: >60 mL/min (ref >60)
Glucose, Bld: 75 mg/dL (ref 70–99)
Potassium: 3.8 mmol/L (ref 3.5–5.1)
Sodium: 141 mmol/L (ref 135–145)
Total Bilirubin: 0.3 mg/dL (ref 0.3–1.2)
Total Protein: 7 g/dL (ref 6.5–8.1)

## 2022-04-09 LAB — TYPE AND SCREEN
ABO/RH(D): B NEG
Antibody Screen: NEGATIVE

## 2022-04-09 NOTE — Progress Notes (Signed)
PCP - Grier Mitts MD Cardiologist -   PPM/ICD - denies Device Orders -  Rep Notified -   Chest x-ray - na EKG - none Stress Test - none ECHO - none Cardiac Cath - none  Sleep Study - no CPAP -   Fasting Blood Sugar - na Checks Blood Sugar _____ times a day  Blood Thinner Instructions:na Aspirin Instructions:na  ERAS Protcol -clear liquids until 1030 PRE-SURGERY Ensure or G2- no  COVID TEST- no   Anesthesia review: no  Patient denies shortness of breath, fever, cough and chest pain at PAT appointment   All instructions explained to the patient, with a verbal understanding of the material. Patient agrees to go over the instructions while at home for a better understanding. Patient also instructed to wear a mask when out in public prior to surgery. The opportunity to ask questions was provided.

## 2022-04-10 LAB — RPR: RPR Ser Ql: NONREACTIVE

## 2022-04-13 ENCOUNTER — Encounter: Payer: Self-pay | Admitting: Skilled Nursing Facility1

## 2022-04-13 ENCOUNTER — Encounter: Payer: BC Managed Care – PPO | Attending: Family Medicine | Admitting: Skilled Nursing Facility1

## 2022-04-13 DIAGNOSIS — Z713 Dietary counseling and surveillance: Secondary | ICD-10-CM | POA: Diagnosis not present

## 2022-04-13 DIAGNOSIS — E669 Obesity, unspecified: Secondary | ICD-10-CM

## 2022-04-13 NOTE — Progress Notes (Signed)
Bariatric Nutrition Follow-Up Visit Medical Nutrition Therapy    NUTRITION ASSESSMENT    Surgery date: 07/15/2021 Surgery type: sleeve Start weight at [facility]: 331.7 pounds Weight today: 276.5 pounds  Body Composition Scale 08/27/2021 11/04/2021 12/10/2021 04/13/2022  Current Body Weight 292.3 276.2 273.7 276.5  Total Body Fat % 48.1 47.4 47.2 46.9  Visceral Fat '15 15 14 14  '$ Fat-Free Mass % 51.8 52.5 52.7 53   Total Body Water % 40.4 40.7 40.8 41  Muscle-Mass lbs 33.1 32.3 32.3 32.9  BMI 51.4 49.8 49.3 48.6  Body Fat Displacement             Torso  lbs 87.4 81.2 80.2 80.6         Left Leg  lbs 17.4 16.2 16 16.1         Right Leg  lbs 17.4 16.2 16 16.1         Left Arm  lbs 8.7 8.1 8 8.0         Right Arm   lbs 8.7 8.1 8 8.0   Clinical  Medical hx: PCOS, diabetes Medications: see list Labs: albumin 3.4 Notable signs/symptoms: N/A Any previous deficiencies? vitamin D   Lifestyle & Dietary Hx  Pt states she does talk therpy weekly.  Pt state she completed [employer] in May and now belongs to [employer] stating she is trying to find the right time to go in the week. Pt states she has a fibroid removal surgery this Wednesday. Pt states she still has liver masses with an upcoming MRI. Pt states she discovered the pain shew as experiencing was from reflux but since taking the antacid she is no longer having any pain.   Pt states she does recognize she is kind of going backwards with her dietary habits. Pt states she is going through and  separation from her husband so that took a lot of her focus. Pt states she was aware to catch she was slipping back and since catching it has been back on track of watching her diet.  Pt states se does live vegetables but no longer likes salads.  Pt states she recognizes when she eats nutrient dense foods she is hungrier more often.  Pt states she did go through a phase of eating french fries as meals.    Estimated daily fluid intake: 40  oz Estimated daily protein intake: 60+ g Supplements: multi and calcium Current average weekly physical activity: walking 30 minutes 30 minutes strength training 3 days a week  24-Hr Dietary Recall First Meal: protein shake or smoked [place] sausage + egg + cheese sandwich on 100 cal english muffin Snack:  almonds or goldfish or pretzels or fruit or vegetables Second Meal: carrots or peas or broccoli + chicken or fish or [place]  Snack: almonds or goldfish or pretzels or fruit or vegetables or mini rice cakes or 70 calorie popcorn Third Meal: carrots or peas or broccoli + chicken or fish or [place] Snack: popcorn or goldfish Beverages: water, gatorade zero, water + flavoring, ocean spary 5 calorie juice   Post-Op Goals/ Signs/ Symptoms Using straws: no Drinking while eating: no Chewing/swallowing difficulties: no Changes in vision: no Changes to mood/headaches: no Hair loss/changes to skin/nails: no Difficulty focusing/concentrating: no Sweating: no Limb weakness: no Dizziness/lightheadedness: no Palpitations: no  Carbonated/caffeinated beverages: no N/V/D/C/Gas: no Abdominal pain: no Dumping syndrome: no    NUTRITION DIAGNOSIS  Overweight/obesity (Edison-3.3) related to past poor dietary habits and physical inactivity as evidenced by completed bariatric  surgery and following dietary guidelines for continued weight loss and healthy nutrition status.     NUTRITION INTERVENTION: continued Nutrition counseling (C-1) and education (E-2) to facilitate bariatric surgery goals, including: The importance of consuming adequate calories as well as certain nutrients daily due to the body's need for essential vitamins, minerals, and fats The importance of daily physical activity and to reach a goal of at least 150 minutes of moderate to vigorous physical activity weekly (or as directed by their physician) due to benefits such as increased musculature and improved lab values The importance of  intuitive eating specifically learning hunger-satiety cues and understanding the importance of learning a new body: The importance of mindful eating to avoid grazing behaviors  Nutrition facts label education: repeated Plant based protien options  Encouraged patient to honor their body's internal hunger and fullness cues.  Throughout the day, check in mentally and rate hunger. Stop eating when satisfied not full regardless of how much food is left on the plate.  Get more if still hungry 20-30 minutes later.  The key is to honor satisfaction so throughout the meal, rate fullness factor and stop when comfortably satisfied not physically full. The key is to honor hunger and fullness without any feelings of guilt or shame.  Pay attention to what the internal cues are, rather than any external factors. This will enhance the confidence you have in listening to your own body and following those internal cues enabling you to increase how often you eat when you are hungry not out of appetite and stop when you are satisfied not full.  Encouraged pt to continue to eat balanced meals inclusive of non starchy vegetables 2 times a day 7 days a week Encouraged pt to choose lean protein sources: limiting beef, pork, sausage, hotdogs, and lunch meat Encourage pt to choose healthy fats such as plant based limiting animal fats Encouraged pt to continue to drink a minium 64 fluid ounces with half being plain water to satisfy proper hydration   Goals: -increase intensity of your walks  -add in another day of cardio in the week   Handouts Provided Include  Phase 7  Learning Style & Readiness for Change Teaching method utilized: Visual & Auditory  Demonstrated degree of understanding via: Teach Back  Readiness Level: Action Barriers to learning/adherence to lifestyle change: none identified   RD's Notes for Next Visit Assess adherence to pt chosen goals    MONITORING & EVALUATION Dietary intake, weekly  physical activity, body weight  Next Steps Patient is to follow-up in October

## 2022-04-15 ENCOUNTER — Ambulatory Visit (HOSPITAL_COMMUNITY): Payer: BC Managed Care – PPO | Admitting: Anesthesiology

## 2022-04-15 ENCOUNTER — Encounter (HOSPITAL_COMMUNITY): Payer: Self-pay | Admitting: Obstetrics and Gynecology

## 2022-04-15 ENCOUNTER — Other Ambulatory Visit: Payer: Self-pay

## 2022-04-15 ENCOUNTER — Ambulatory Visit (HOSPITAL_COMMUNITY)
Admission: RE | Admit: 2022-04-15 | Discharge: 2022-04-15 | Disposition: A | Discharge status: Home / Self Care | Payer: BC Managed Care – PPO | Attending: Obstetrics and Gynecology | Admitting: Obstetrics and Gynecology

## 2022-04-15 ENCOUNTER — Encounter (HOSPITAL_COMMUNITY)
Admission: RE | Disposition: A | Discharge status: Home / Self Care | Payer: Self-pay | Source: Home / Self Care | Attending: Obstetrics and Gynecology

## 2022-04-15 DIAGNOSIS — K219 Gastro-esophageal reflux disease without esophagitis: Secondary | ICD-10-CM | POA: Diagnosis not present

## 2022-04-15 DIAGNOSIS — D251 Intramural leiomyoma of uterus: Secondary | ICD-10-CM | POA: Insufficient documentation

## 2022-04-15 DIAGNOSIS — E119 Type 2 diabetes mellitus without complications: Secondary | ICD-10-CM | POA: Insufficient documentation

## 2022-04-15 DIAGNOSIS — D259 Leiomyoma of uterus, unspecified: Secondary | ICD-10-CM | POA: Diagnosis not present

## 2022-04-15 DIAGNOSIS — D649 Anemia, unspecified: Secondary | ICD-10-CM | POA: Diagnosis not present

## 2022-04-15 DIAGNOSIS — D759 Disease of blood and blood-forming organs, unspecified: Secondary | ICD-10-CM | POA: Diagnosis not present

## 2022-04-15 DIAGNOSIS — N920 Excessive and frequent menstruation with regular cycle: Secondary | ICD-10-CM | POA: Diagnosis not present

## 2022-04-15 DIAGNOSIS — Z6841 Body Mass Index (BMI) 40.0 and over, adult: Secondary | ICD-10-CM | POA: Diagnosis not present

## 2022-04-15 DIAGNOSIS — J45909 Unspecified asthma, uncomplicated: Secondary | ICD-10-CM | POA: Diagnosis not present

## 2022-04-15 DIAGNOSIS — E039 Hypothyroidism, unspecified: Secondary | ICD-10-CM | POA: Insufficient documentation

## 2022-04-15 HISTORY — PX: LAPAROSCOPIC GELPORT ASSISTED MYOMECTOMY: SHX6549

## 2022-04-15 LAB — POCT PREGNANCY, URINE: Preg Test, Ur: NEGATIVE

## 2022-04-15 SURGERY — LAPAROSCOPIC GELPORT ASSISTED MYOMECTOMY
Anesthesia: General | Site: Abdomen

## 2022-04-15 MED ORDER — ONDANSETRON HCL 4 MG PO TABS: 4.0000 mg | ORAL_TABLET | Freq: Every day | ORAL | 1 refills | Status: DC | PRN

## 2022-04-15 MED ORDER — DEXAMETHASONE SODIUM PHOSPHATE 10 MG/ML IJ SOLN
INTRAMUSCULAR | Status: DC | PRN
  Administered 2022-04-15: 10 mg via INTRAVENOUS

## 2022-04-15 MED ORDER — OXYCODONE-ACETAMINOPHEN 7.5-325 MG PO TABS: 1.0000 | ORAL_TABLET | ORAL | 0 refills | Status: DC | PRN

## 2022-04-15 MED ORDER — SODIUM CHLORIDE 0.9 % IR SOLN
Status: DC | PRN
  Administered 2022-04-15: 1000 mL

## 2022-04-15 MED ORDER — MIDAZOLAM HCL 2 MG/2ML IJ SOLN
INTRAMUSCULAR | Status: DC | PRN
  Administered 2022-04-15: 2 mg via INTRAVENOUS

## 2022-04-15 MED ORDER — VASOPRESSIN 20 UNIT/ML IV SOLN
INTRAVENOUS | Status: DC | PRN
  Administered 2022-04-15: 20 mL via INTRAMUSCULAR

## 2022-04-15 MED ORDER — EPHEDRINE SULFATE-NACL 50-0.9 MG/10ML-% IV SOSY
PREFILLED_SYRINGE | INTRAVENOUS | Status: DC | PRN
  Administered 2022-04-15 (×2): 10 mg via INTRAVENOUS
  Administered 2022-04-15: 5 mg via INTRAVENOUS

## 2022-04-15 MED ORDER — LIDOCAINE 2% (20 MG/ML) 5 ML SYRINGE
INTRAMUSCULAR | Status: DC | PRN
  Administered 2022-04-15: 100 mg via INTRAVENOUS

## 2022-04-15 MED ORDER — LACTATED RINGERS IV SOLN: INTRAVENOUS | Status: DC

## 2022-04-15 MED ORDER — FENTANYL CITRATE (PF) 100 MCG/2ML IJ SOLN
INTRAMUSCULAR | Status: DC | PRN
  Administered 2022-04-15 (×5): 50 ug via INTRAVENOUS

## 2022-04-15 MED ORDER — METHYLENE BLUE 1 % INJ SOLN
INTRAVENOUS | Status: DC | PRN
  Administered 2022-04-15: 35 mL

## 2022-04-15 MED ORDER — ROCURONIUM BROMIDE 10 MG/ML (PF) SYRINGE
PREFILLED_SYRINGE | INTRAVENOUS | Status: DC | PRN
  Administered 2022-04-15: 100 mg via INTRAVENOUS
  Administered 2022-04-15 (×2): 10 mg via INTRAVENOUS

## 2022-04-15 MED ORDER — DEXTROSE 5 % IV SOLN
INTRAVENOUS | Status: DC | PRN
  Administered 2022-04-15: 3 g via INTRAVENOUS

## 2022-04-15 MED ORDER — BUPIVACAINE-EPINEPHRINE 0.25% -1:200000 IJ SOLN
INTRAMUSCULAR | Status: DC | PRN
  Administered 2022-04-15: 22 mL

## 2022-04-15 MED ORDER — OXYCODONE HCL 5 MG/5ML PO SOLN: 5.0000 mg | Freq: Once | ORAL | Status: AC | PRN

## 2022-04-15 MED ORDER — PHENYLEPHRINE 80 MCG/ML (10ML) SYRINGE FOR IV PUSH (FOR BLOOD PRESSURE SUPPORT)
PREFILLED_SYRINGE | INTRAVENOUS | Status: DC | PRN
  Administered 2022-04-15: 240 ug via INTRAVENOUS
  Administered 2022-04-15 (×2): 160 ug via INTRAVENOUS
  Administered 2022-04-15: 240 ug via INTRAVENOUS

## 2022-04-15 MED ORDER — ORAL CARE MOUTH RINSE: 15.0000 mL | Freq: Once | OROMUCOSAL | Status: AC

## 2022-04-15 MED ORDER — PROMETHAZINE HCL 25 MG/ML IJ SOLN: 6.2500 mg | INTRAMUSCULAR | Status: DC | PRN

## 2022-04-15 MED ORDER — MEPERIDINE HCL 25 MG/ML IJ SOLN: 6.2500 mg | INTRAMUSCULAR | Status: DC | PRN

## 2022-04-15 MED ORDER — OXYCODONE HCL 5 MG PO TABS
5.0000 mg | ORAL_TABLET | Freq: Once | ORAL | Status: AC | PRN
  Administered 2022-04-15: 5 mg via ORAL

## 2022-04-15 MED ORDER — POVIDONE-IODINE 10 % EX SWAB: Freq: Once | CUTANEOUS | Status: DC

## 2022-04-15 MED ORDER — HYDROMORPHONE HCL 1 MG/ML IJ SOLN
0.2500 mg | INTRAMUSCULAR | Status: DC | PRN
  Administered 2022-04-15: 0.5 mg via INTRAVENOUS

## 2022-04-15 MED ORDER — ACETAMINOPHEN 10 MG/ML IV SOLN
INTRAVENOUS | Status: DC | PRN
  Administered 2022-04-15: 1000 mg via INTRAVENOUS

## 2022-04-15 MED ORDER — METHYLENE BLUE 1 % INJ SOLN: INTRAVENOUS | Status: DC | PRN

## 2022-04-15 MED ORDER — KETOROLAC TROMETHAMINE 30 MG/ML IJ SOLN
INTRAMUSCULAR | Status: DC | PRN
  Administered 2022-04-15: 30 mg via INTRAVENOUS

## 2022-04-15 MED ORDER — PROPOFOL 10 MG/ML IV BOLUS
INTRAVENOUS | Status: DC | PRN
  Administered 2022-04-15: 150 mg via INTRAVENOUS

## 2022-04-15 MED ORDER — AMISULPRIDE (ANTIEMETIC) 5 MG/2ML IV SOLN: 10.0000 mg | Freq: Once | INTRAVENOUS | Status: DC | PRN

## 2022-04-15 MED ORDER — SUGAMMADEX SODIUM 200 MG/2ML IV SOLN
INTRAVENOUS | Status: DC | PRN
  Administered 2022-04-15: 100 mg via INTRAVENOUS
  Administered 2022-04-15: 200 mg via INTRAVENOUS

## 2022-04-15 MED ORDER — ONDANSETRON HCL 4 MG/2ML IJ SOLN
INTRAMUSCULAR | Status: DC | PRN
  Administered 2022-04-15: 4 mg via INTRAVENOUS

## 2022-04-15 MED ORDER — CHLORHEXIDINE GLUCONATE 0.12 % MT SOLN
15.0000 mL | Freq: Once | OROMUCOSAL | Status: AC
  Administered 2022-04-15: 15 mL via OROMUCOSAL

## 2022-04-15 SURGICAL SUPPLY — 60 items
BENZOIN TINCTURE PRP APPL 2/3 (GAUZE/BANDAGES/DRESSINGS) ×1 IMPLANT
BLADE SURG 10 STRL SS (BLADE) ×2 IMPLANT
CATH ROBINSON RED A/P 16FR (CATHETERS) ×2 IMPLANT
CNTNR URN SCR LID CUP LEK RST (MISCELLANEOUS) IMPLANT
CONT SPEC 4OZ STRL OR WHT (MISCELLANEOUS) ×1
DEPRESSOR TONGUE BLADE STERILE (MISCELLANEOUS) ×1 IMPLANT
DEVICE TROCAR PUNCTURE CLOSURE (ENDOMECHANICALS) ×1 IMPLANT
DRSG OPSITE POSTOP 3X4 (GAUZE/BANDAGES/DRESSINGS) ×2 IMPLANT
DRSG TEGADERM 4X4.75 (GAUZE/BANDAGES/DRESSINGS) ×1 IMPLANT
DURAPREP 26ML APPLICATOR (WOUND CARE) ×2 IMPLANT
ELECT NDL TIP 2.8 STRL (NEEDLE) ×1 IMPLANT
ELECT NEEDLE TIP 2.8 STRL (NEEDLE) ×2 IMPLANT
ELECT REM PT RETURN 9FT ADLT (ELECTROSURGICAL) ×2
ELECTRODE REM PT RTRN 9FT ADLT (ELECTROSURGICAL) ×1 IMPLANT
FILTER SMOKE EVAC LAPAROSHD (FILTER) IMPLANT
GAUZE 4X4 16PLY ~~LOC~~+RFID DBL (SPONGE) ×2 IMPLANT
GAUZE SPONGE 2X2 8PLY STRL LF (GAUZE/BANDAGES/DRESSINGS) IMPLANT
GLOVE BIO SURGEON STRL SZ8 (GLOVE) ×2 IMPLANT
GLOVE BIOGEL PI IND STRL 7.0 (GLOVE) ×2 IMPLANT
GLOVE BIOGEL PI IND STRL 8.5 (GLOVE) ×1 IMPLANT
GLOVE BIOGEL PI INDICATOR 7.0 (GLOVE) ×2
GLOVE BIOGEL PI INDICATOR 8.5 (GLOVE) ×1
GOWN STRL REUS W/ TWL LRG LVL3 (GOWN DISPOSABLE) ×4 IMPLANT
GOWN STRL REUS W/TWL LRG LVL3 (GOWN DISPOSABLE) ×4
IRRIG SUCT STRYKERFLOW 2 WTIP (MISCELLANEOUS) ×2
IRRIGATION SUCT STRKRFLW 2 WTP (MISCELLANEOUS) ×1 IMPLANT
KIT TURNOVER KIT B (KITS) ×2 IMPLANT
MANIPULATOR UTERINE 4.5 ZUMI (MISCELLANEOUS) ×2 IMPLANT
NDL INSUFFLATION 14GA 120MM (NEEDLE) ×1 IMPLANT
NEEDLE HYPO 22GX1.5 SAFETY (NEEDLE) ×2 IMPLANT
NEEDLE INSUFFLATION 14GA 120MM (NEEDLE) ×2 IMPLANT
PACK LAPAROSCOPY BASIN (CUSTOM PROCEDURE TRAY) ×2 IMPLANT
PACK TRENDGUARD 450 HYBRID PRO (MISCELLANEOUS) IMPLANT
PAD OB MATERNITY 4.3X12.25 (PERSONAL CARE ITEMS) ×2 IMPLANT
PENCIL BUTTON HOLSTER BLD 10FT (ELECTRODE) ×2 IMPLANT
POUCH LAPAROSCOPIC INSTRUMENT (MISCELLANEOUS) ×1 IMPLANT
PROTECTOR NERVE ULNAR (MISCELLANEOUS) ×4 IMPLANT
SEPRAFILM MEMBRANE 5X6 (MISCELLANEOUS) ×4 IMPLANT
SET TUBE SMOKE EVAC HIGH FLOW (TUBING) ×2 IMPLANT
SHEARS HARMONIC ACE PLUS 36CM (ENDOMECHANICALS) ×1 IMPLANT
SLEEVE ENDOPATH XCEL 5M (ENDOMECHANICALS) ×4 IMPLANT
SPONGE GAUZE 2X2 STER 10/PKG (GAUZE/BANDAGES/DRESSINGS) ×1
STRIP CLOSURE SKIN 1/2X4 (GAUZE/BANDAGES/DRESSINGS) IMPLANT
SUT MNCRL AB 4-0 PS2 18 (SUTURE) ×3 IMPLANT
SUT VIC AB 0 CTX 27 (SUTURE) ×1 IMPLANT
SUT VIC AB 2-0 CT1 27 (SUTURE) ×4
SUT VIC AB 2-0 CT1 TAPERPNT 27 (SUTURE) ×2 IMPLANT
SUT VIC AB 2-0 CT2 27 (SUTURE) IMPLANT
SUT VIC AB 4-0 SH 27 (SUTURE) ×1
SUT VIC AB 4-0 SH 27XANBCTRL (SUTURE) ×2 IMPLANT
SYR 50ML LL SCALE MARK (SYRINGE) ×2 IMPLANT
SYR 5ML LL (SYRINGE) ×2 IMPLANT
SYR TOOMEY 50ML (SYRINGE) ×2 IMPLANT
SYS LAPSCP GELPORT 120MM (MISCELLANEOUS) ×2
SYSTEM LAPSCP GELPORT 120MM (MISCELLANEOUS) IMPLANT
TOWEL GREEN STERILE FF (TOWEL DISPOSABLE) ×4 IMPLANT
TRAY FOLEY W/BAG SLVR 14FR (SET/KITS/TRAYS/PACK) ×2 IMPLANT
TRENDGUARD 450 HYBRID PRO PACK (MISCELLANEOUS) ×2
TROCAR OPTI TIP 5M 100M (ENDOMECHANICALS) ×2 IMPLANT
WARMER LAPAROSCOPE (MISCELLANEOUS) ×2 IMPLANT

## 2022-04-15 NOTE — Op Note (Signed)
Operative Note   Preoperative diagnosis: Uterine fibroid, menorrhagia   Postoperative diagnosis: Uterine fibroid, menorrhagia   Procedure: Laparoscopy, GelPort assisted myomectomy, chromotubation  Surgeon:  Assistant: Sullivan Lone, RNFA  Anesthesia: Gen. endotracheal   Complications: Left epigastric vessel bleeding, controlled with a suture   Estimated blood loss: 200 mL   Specimens: Uterine myoma to pathology   Findings: On examination under anesthesia, external genitalia, Bartholin's, Skene's, and urethra were normal. The vagina was normal. The cervix was nulliparous and appeared grossly normal. The uterus was 13-14 gestational week size, firm and mobile with irregularities caused by myoma. It sounded to 11 cm.  On laparoscopy, upper abdomen, and diaphragm surfaces were normal.  Right liver contained a 4 x 4 centimeter mass (adenoma for which general surgery has been following the patient).  The gallbladder was normal. The appendix appeared normal.  The uterus contained 8 x 7 cm anterior fundal degenerating intramural myoma with the lower aspect of it attached to the endometrium, which was not entered during the myomectomy.  Both ovaries were smooth and slightly enlarged, suggestive of polycystic ovaries.  Both fallopian tubes appeared normal, with 5 out of 5 fimbria.  On chromotubation the tubes did not fill but we think this may have been an artifact since the proximal and distal portion of the tubes appeared normal. The pelvic peritoneum looked mostly normal.  There were no pigmented lesions of endometriosis.    Description of the procedure: The patient was placed in dorsal supine position and general endotracheal anesthesia was given. 2 g of cefazolin were given intravenously for prophylaxis. Patient was placed in lithotomy position. She was prepped and draped in sterile manner.  The bladder was straight catheterized . A ZUMI catheter was placed into the uterine cavity. This was  connected to a syringe containing diluted methylene blue solution which was used to define the endometrium during the myomectomy. The uterus sounded to 11 cm The surgeon was regloved and a surgical field was created on the abdomen.   After preemptive anesthesia of all surgical sites with 0.5% bupivacaine, a 5 mm intraumbilical skin incision was made and a Verress needle was inserted. Its correct location was confirmed. A pneumoperitoneum was created with carbon dioxide.  5 mm laparoscope with a 30 lens was inserted and video laparoscopy was started . A left lower quadrant 5 mm and a right lower quadrant  5 mm incisions were made and ancillary trochars were placed under direct visualization. Above findings were noted. Brisk bleeding from the left lower quadrant entry suggested inferior epigastric vessel bleeding and we stopped it by placing a suture of 0 Vicryl with the help of a ligature carrier Carter-Thomason.   A dilute solution of vasopressin (0.4 units per mL) was injected into the myometrium overlying the fundal myoma, until the myometrium blanched. A needle electrode with 49 W of cutting current  was used to make a transverse incision on the myometrium overlying the fundal 8 x 7 cm myoma. The myoma was grasped with tenaculum and dissection was started.  We then made a 4 cm transverse suprapubic incision to insert a GelPort. After dissection of the anatomic layers, the peritoneal cavity was entered. A GelPort was placed and the rest of the case was performed either using this port as a laparoscopic port or as a minilaparotomy port.  The rest of the myoma had to be in situ morcellated to eventually take it out of this 4-5 cm incision. This was carried out by blunt and sharp  dissection and in situ morcellation was performed with #10 blade.  The excess myometrium that had formed to wrap around the large myoma was trimmed off and the myoma defect was closed in 3 layers: The first layer was a deep  myometrial suture of 2-0 Vicryl continuous interlocking suture, the second layer was a superficial myometrial layer of 2-0 Vicryl continuous suture. A 4-0 Vicryl continuous suture was placed on the serosa and the most superficial myometrium.  The suprapubic fascial incision was closed with 2-0 Vicryl continuous suture. Subcutaneous tissue was irrigated and aspirated good hemostasis was achieved. The abdomen and the pelvis was carefully inspected under laparoscopic visualization and the pelvis was copiously irrigated and aspirated. A slurry of 2 sheets of Seprafilm in 60 mL of normal saline was injected as an adhesion barrier into the pelvis. The gas was allowed to escape. The instrument and the lap pad count were correct. The trochars were removed. The skin incisions were approximated with 4-0 Monocryl in subcuticular sutures, including the 3 cm skin incision that belonged to the [place] site.   The patient tolerated the procedure well and was transferred to recovery room in satisfactory condition.   SPECIAL NOTE: Because of the extent of the myometrial incision during the uterine myomectomy, it is recommended that this patient deliver by a cesarean section with her future pregnancies.   Governor Specking, MD

## 2022-04-15 NOTE — Anesthesia Postprocedure Evaluation (Signed)
Anesthesia Post Note  Patient: Christine Nelson  Procedure(s) Performed: LAPAROSCOPIC GELPORT ASSISTED MYOMECTOMY (Abdomen)     Patient location during evaluation: PACU Anesthesia Type: General Level of consciousness: awake and alert Pain management: pain level controlled Vital Signs Assessment: post-procedure vital signs reviewed and stable Respiratory status: spontaneous breathing, nonlabored ventilation and respiratory function stable Cardiovascular status: blood pressure returned to baseline and stable Postop Assessment: no apparent nausea or vomiting Anesthetic complications: no   No notable events documented.  Last Vitals:  Vitals:   04/15/22 1730 04/15/22 1745  BP: 129/75 129/70  Pulse: 89 83  Resp: 10 13  Temp:  (!) 36.1 C  SpO2: 97% 99%    Last Pain:  Vitals:   04/15/22 1745  TempSrc:   PainSc: 4                  Tabita Corbo,W. EDMOND

## 2022-04-15 NOTE — Anesthesia Preprocedure Evaluation (Signed)
Anesthesia Evaluation  Patient identified by MRN, date of birth, ID band Patient awake    Reviewed: Allergy & Precautions, NPO status , Patient's Chart, lab work & pertinent test results  Airway Mallampati: III  TM Distance: >3 FB Neck ROM: Full   Comment: Pierced right nostril Dental no notable dental hx. (+) Teeth Intact, Dental Advisory Given   Pulmonary asthma ,    Pulmonary exam normal breath sounds clear to auscultation       Cardiovascular negative cardio ROS Normal cardiovascular exam Rhythm:Regular Rate:Normal     Neuro/Psych negative neurological ROS  negative psych ROS   GI/Hepatic Neg liver ROS, GERD  Medicated and Controlled,  Endo/Other  diabetesHypothyroidism Morbid obesity  Renal/GU negative Renal ROS  negative genitourinary   Musculoskeletal negative musculoskeletal ROS (+)   Abdominal (+) + obese,   Peds  Hematology  (+) Blood dyscrasia, anemia ,   Anesthesia Other Findings   Reproductive/Obstetrics                             Anesthesia Physical  Anesthesia Plan  ASA: 3  Anesthesia Plan: General   Post-op Pain Management: Dilaudid IV, Ofirmev IV (intra-op)* and Toradol IV (intra-op)*   Induction: Intravenous  PONV Risk Score and Plan: 3 and Treatment may vary due to age or medical condition, Midazolam, Ondansetron and Dexamethasone  Airway Management Planned: Oral ETT  Additional Equipment:   Intra-op Plan:   Post-operative Plan: Extubation in OR  Informed Consent: I have reviewed the patients History and Physical, chart, labs and discussed the procedure including the risks, benefits and alternatives for the proposed anesthesia with the patient or authorized representative who has indicated his/her understanding and acceptance.     Dental advisory given  Plan Discussed with: CRNA and Anesthesiologist  Anesthesia Plan Comments:          Anesthesia Quick Evaluation

## 2022-04-15 NOTE — Transfer of Care (Signed)
Immediate Anesthesia Transfer of Care Note  Patient: Christine Nelson  Procedure(s) Performed: LAPAROSCOPIC GELPORT ASSISTED MYOMECTOMY (Abdomen)  Patient Location: PACU  Anesthesia Type:General  Level of Consciousness: drowsy and patient cooperative  Airway & Oxygen Therapy: Patient Spontanous Breathing  Post-op Assessment: Report given to RN  Post vital signs: Reviewed and stable  Last Vitals:  Vitals Value Taken Time  BP 128/80 04/15/22 1651  Temp    Pulse 95 04/15/22 1653  Resp 17 04/15/22 1653  SpO2 97 % 04/15/22 1653  Vitals shown include unvalidated device data.  Last Pain:  Vitals:   04/15/22 1200  TempSrc: Oral         Complications: No notable events documented.

## 2022-04-15 NOTE — H&P (Addendum)
Christine Nelson is a 31 y.o. female , originally referred to me by Dr. Lucillie Garfinkel, for laparoscopic GelPort assisted myomectomy.  She was diagnosed with fibroid because of abnormal uterine bleeding and was placed on oral contraceptives.  She developed breakthrough bleeding and had to stop the pills.  She was then placed on Lupron Depo injections for the past 12 months.  She has been amenorrheic.  Last ultrasound in my office on 01/12/2022 showed a posterior right-sided transmural myoma measuring 7.8 x 7.7 x 7.7 cm.   Patient would like to preserve her childbearing potential.  Patient also reports dysmenorrhea before she was placed on birth control pills.  She reports irregular periods before OCPs.  Pertinent Gynecological History: Menses: Amenorrhea on Lupron Depo injections  Bleeding: dysfunctional uterine bleeding hx Contraception: none DES exposure: denies Blood transfusions: none Sexually transmitted diseases: no past history Previous GYN Procedures: None Last pap: normal  OB History: Nulligravida    Menstrual History: Menarche age: 84 No LMP recorded.    Past Medical History:  Diagnosis Date   Asthma    with allergy season   Fibroids    GERD (gastroesophageal reflux disease)    History of prediabetes    Hypothyroidism    Liver masses    followed by Michaelle Birks MD .   Obesity    PCOS (polycystic ovarian syndrome)    Uterine leiomyoma    Vitamin D deficiency                     Past Surgical History:  Procedure Laterality Date   BIOPSY  05/09/2021   Procedure: BIOPSY;  Surgeon: Felicie Morn, MD;  Location: [facility];  Service: General;;   ESOPHAGOGASTRODUODENOSCOPY N/A 05/09/2021   Procedure: ESOPHAGOGASTRODUODENOSCOPY (EGD) with biopsies;  Surgeon: Felicie Morn, MD;  Location: [facility];  Service: General;  Laterality: N/A;   LAPAROSCOPIC GASTRIC SLEEVE RESECTION  07/15/2021   UPPER GI ENDOSCOPY N/A 07/15/2021   Procedure: UPPER GI  ENDOSCOPY;  Surgeon: Felicie Morn, MD;  Location: [facility];  Service: General;  Laterality: N/A;   WISDOM TOOTH EXTRACTION  2011             Family History  Problem Relation Age of Onset   Hypertension Mother    Thyroid disease Father    Hypertension Maternal Grandfather    Stroke Maternal Grandfather    Colon cancer Neg Hx    Esophageal cancer Neg Hx    Rectal cancer Neg Hx    No hereditary disease.  No cancer of breast, ovary, uterus. No cutaneous leiomyomatosis or renal cell carcinoma.  Social History   Socioeconomic History   Marital status: Married    Spouse name: Amie [place]   Number of children: 0   Years of education: Not on file   Highest education level: Master's degree (e.g., MA, MS, MEng, MEd, MSW, MBA)  Occupational History   Occupation: student   Occupation: Data processing manager Asst for [employer]  Tobacco Use   Smoking status: Never   Smokeless tobacco: Never  Vaping Use   Vaping Use: Never used  Substance and Sexual Activity   Alcohol use: Never   Drug use: Never   Sexual activity: Yes    Partners: Male    Birth control/protection: Injection  Other Topics Concern   Not on file  Social History Narrative   Pt is single.  She has a boyfriend.  Pt graduated college with a degree in Biology.  Pt has always  wanted to become a physician.  Pt was teaching Biology.  She is now working part-time at [employer].         Social Determinants of Health   Financial Resource Strain: Medium Risk (12/31/2021)   Overall Financial Resource Strain (CARDIA)    Difficulty of Paying Living Expenses: Somewhat hard  Food Insecurity: No Food Insecurity (12/31/2021)   Hunger Vital Sign    Worried About Running Out of Food in the Last Year: Never true    Ran Out of Food in the Last Year: Never true  Transportation Needs: No Transportation Needs (12/31/2021)   PRAPARE - Hydrologist (Medical): No    Lack of Transportation (Non-Medical): No   Physical Activity: Sufficiently Active (12/31/2021)   Exercise Vital Sign    Days of Exercise per Week: 3 days    Minutes of Exercise per Session: 60 min  Stress: No Stress Concern Present (12/31/2021)   [place]    Feeling of Stress : Only a little  Social Connections: Moderately Integrated (12/31/2021)   Social Connection and Isolation Panel [NHANES]    Frequency of Communication with Friends and Family: More than three times a week    Frequency of Social Gatherings with Friends and Family: Once a week    Attends Religious Services: More than 4 times per year    Active Member of [employer] or Organizations: No    Attends Music therapist: Not on file    Marital Status: Married  Human resources officer Violence: Not on file    No Known Allergies  No current facility-administered medications on file prior to encounter.   Current Outpatient Medications on File Prior to Encounter  Medication Sig Dispense Refill   albuterol (VENTOLIN HFA) 108 (90 Base) MCG/ACT inhaler Inhale 2 puffs into the lungs every 6 (six) hours as needed for wheezing or shortness of breath.     calcium carbonate (TUMS EX) 750 MG chewable tablet Chew 750 mg by mouth See admin instructions. Take 750 mg at 2 pm and 750 mg at 4 pm     levothyroxine (SYNTHROID) 125 MCG tablet Take 125 mcg by mouth daily before breakfast.     Multiple Vitamins-Minerals (BARIATRIC MULTIVITAMINS/IRON PO) Take 1 tablet by mouth daily at 12 noon.     hydrocortisone 2.5 % ointment Apply topically 2 (two) times daily. (Patient not taking: Reported on 03/31/2022) 30 g 0     Review of Systems  Constitutional: Negative.   HENT: Negative.   Eyes: Negative.   Respiratory: Negative.   Cardiovascular: Negative.   Gastrointestinal: Negative.   Genitourinary: Negative.   Musculoskeletal: Negative.   Skin: Negative.   Neurological: Negative.   Endo/Heme/Allergies: Negative.    Psychiatric/Behavioral: Negative.      Physical Exam  BP 120/82   Pulse 78   Temp 97.7 F (36.5 C) (Oral)   Resp 18   Ht '5\' 3"'$  (1.6 m)   Wt 124.7 kg   SpO2 98%   BMI 48.71 kg/m  Constitutional: She is oriented to person, place, and time. She appears well-developed and well-nourished.  HENT:  Head: Normocephalic and atraumatic.  Nose: Nose normal.  Mouth/Throat: Oropharynx is clear and moist. No oropharyngeal exudate.  Eyes: Conjunctivae normal and EOM are normal. Pupils are equal, round, and reactive to light. No scleral icterus.  Neck: Normal range of motion. Neck supple. No tracheal deviation present. No thyromegaly present.  Cardiovascular: Normal rate.  Respiratory: Effort normal and breath sounds normal.  GI: Soft. Bowel sounds are normal. She exhibits no distension and no mass. There is no tenderness.  Lymphadenopathy:    She has no cervical adenopathy.  Neurological: She is alert and oriented to person, place, and time. She has normal reflexes.  Skin: Skin is warm.  Psychiatric: She has a normal mood and affect. Her behavior is normal. Judgment and thought content normal.    Assessment/Plan:  Large transmural uterine myoma, causing menorrhagia and pressure sensation. History of dysmenorrhea, currently on Lupron Depo injection induced amenorrhea Preoperative for laparoscopic GelPort assisted myomectomy, possible excision of any endometriosis, chromotubation Benefits and risks of the proposed procedure were discussed with the patient and her family member again.  Bowel prep instructions were given.  All of patient's questions were answered.  She verbalized understanding.  She knows that she will need a cesarean delivery for future pregnancies, and that it is recommended she does not conceive for 2-3 months for uterus to heal.  Governor Specking, MD

## 2022-04-15 NOTE — Anesthesia Procedure Notes (Signed)
Procedure Name: Intubation Date/Time: 04/15/2022 2:26 PM  Performed by: Barrington Ellison, CRNAPre-anesthesia Checklist: Patient identified, Emergency Drugs available, Suction available and Patient being monitored Patient Re-evaluated:Patient Re-evaluated prior to induction Oxygen Delivery Method: Circle System Utilized Preoxygenation: Pre-oxygenation with 100% oxygen Induction Type: IV induction Ventilation: Mask ventilation without difficulty Laryngoscope Size: Mac and 3 Grade View: Grade I Tube type: Oral Tube size: 7.0 mm Number of attempts: 1 Airway Equipment and Method: Stylet and Oral airway Placement Confirmation: ETT inserted through vocal cords under direct vision, positive ETCO2 and breath sounds checked- equal and bilateral Secured at: 21 cm Tube secured with: Tape Dental Injury: Teeth and Oropharynx as per pre-operative assessment

## 2022-04-16 ENCOUNTER — Encounter (HOSPITAL_COMMUNITY): Payer: Self-pay | Admitting: Obstetrics and Gynecology

## 2022-04-17 LAB — SURGICAL PATHOLOGY

## 2022-04-28 ENCOUNTER — Ambulatory Visit (INDEPENDENT_AMBULATORY_CARE_PROVIDER_SITE_OTHER): Payer: BC Managed Care – PPO | Admitting: Allergy & Immunology

## 2022-04-28 ENCOUNTER — Encounter: Payer: Self-pay | Admitting: Allergy & Immunology

## 2022-04-28 VITALS — BP 116/76 | HR 71 | Temp 97.9°F | Resp 16 | Ht 62.0 in | Wt 267.5 lb

## 2022-04-28 DIAGNOSIS — L508 Other urticaria: Secondary | ICD-10-CM

## 2022-04-28 DIAGNOSIS — J4520 Mild intermittent asthma, uncomplicated: Secondary | ICD-10-CM | POA: Diagnosis not present

## 2022-04-28 DIAGNOSIS — J310 Chronic rhinitis: Secondary | ICD-10-CM | POA: Diagnosis not present

## 2022-04-28 NOTE — Patient Instructions (Signed)
1. Chronic urticaria - Your history does not have any "red flags" such as fevers, joint pains, or permanent skin changes that would be concerning for a more serious cause of hives.  - There were several triggers on testing today, including a number of outdoor allergens. - Thankfully testing to the most common foods was negative. - There is a the low positive predictive value of food allergy testing and hence the high possibility of false positives. - In contrast, food allergy testing has a high negative predictive value, therefore if testing is negative we can be relatively assured that they are indeed negative.  - Copy of testing results provided today.  - We will get some labs to rule out serious causes of hives: alpha gal panel, complete blood count, tryptase level, chronic urticaria panel, CMP, ANA, ESR, and CRP.  - Chronic hives are often times a self limited process and will "burn themselves out" over 6-12 months, although this is not always the case.  - In the meantime, start suppressive dosing of antihistamines:   - Morning: Zyrtec (cetirizine) 23m I  - Evening: Zyrtec (cetirizine) 139mIF NEEDED  - If the above is not working, try adding: Pepcid (famotidine) 2020m You can change this dosing at home, decreasing the dose as needed or increasing the dosing as needed.  - If you are not tolerating the medications or are tired of taking them every day, we can start treatment with a monthly injectable medication called Xolair.  - However, with the long time span between outbreaks, I think Xolair would be an aggressive move at this point in time.   2. Mild intermittent asthma, uncomplicated - Lung testing looked awesome today. - I do not think that we need a controller medication at this time. - Daily controller medication(s): NONE - Prior to physical activity: albuterol 2 puffs 10-15 minutes before physical activity. - Rescue medications: albuterol 4 puffs every 4-6 hours as needed -  Asthma control goals:  * Full participation in all desired activities (may need albuterol before activity) * Albuterol use two time or less a week on average (not counting use with activity) * Cough interfering with sleep two time or less a month * Oral steroids no more than once a year * No hospitalizations  3. Chronic rhinitis - Testing today showed: grasses, weeds, trees, outdoor molds, and cat. - Copy of test results provided.  - Avoidance measures provided. - Start taking: Zyrtec (cetirizine) 2m8mblet once daily (especially in the spring/summer/fall) - You can use an extra dose of the antihistamine, if needed, for breakthrough symptoms.  - Consider nasal saline rinses 1-2 times daily to remove allergens from the nasal cavities as well as help with mucous clearance (this is especially helpful to do before the nasal sprays are given) - Consider allergy shots as a means of long-term control. - Allergy shots "re-train" and "reset" the immune system to ignore environmental allergens and decrease the resulting immune response to those allergens (sneezing, itchy watery eyes, runny nose, nasal congestion, etc).    - Allergy shots improve symptoms in 75-85% of patients.  - We can discuss more at the next appointment if the medications are not working for you, although your symptoms seem to be relatively minor.   4. No follow-ups on file.    Please inform us [place] Emergency Department visits, hospitalizations, or changes in symptoms. Call us [place] going to the ED for breathing or allergy symptoms since we might be able to  fit you in for a sick visit. Feel free to contact us anytime with any questions, problems, or concerns.  It was a pleasure to meet you today! Good luck with all of your future endeavors, especially medical school!   Websites that have reliable patient information: 1. American Academy of Asthma, Allergy, and Immunology: www.aaaai.org 2. Food Allergy Research and Education  (FARE): foodallergy.org 3. Mothers of Asthmatics: http://www.asthmacommunitynetwork.org 4. American College of Allergy, Asthma, and Immunology: www.acaai.org   COVID-19 Vaccine Information can be found at: [email or web address] For questions related to vaccine distribution or appointments, please email [email or web address] or call [phone].   We realize that you might be concerned about having an allergic reaction to the COVID19 vaccines. To help with that concern, WE ARE OFFERING THE COVID19 VACCINES IN OUR OFFICE! Ask the front desk for dates!     "Like" [place] on Facebook and Instagram for our latest updates!      A healthy democracy works best when [employer] participate! Make sure you are registered to vote! If you have moved or changed any of your contact information, you will need to get this updated before voting!  In some cases, you MAY be able to register to vote online: [email or web address]     Airborne Adult Perc - 04/28/22 1020     Time Antigen Placed 1000    Allergen Manufacturer Greer    Location Back    Number of Test 59    1. Control-Buffer 50% Glycerol Negative    2. Control-Histamine 1 mg/ml 2+    3. Albumin saline Negative    4. [place] Negative    5. [place] Negative    6. Johnson Negative    7. [place] Blue 2+    8. Meadow Fescue 2+    9. Perennial Rye 3+    10. Sweet Vernal Negative    11. Timothy Negative    12. Cocklebur Negative    13. Burweed Marshelder Negative    14. Ragweed, short Negative    15. Ragweed, Giant Negative    16. Plantain,  English Negative    17. Lamb's Quarters Negative    18. Sheep Sorrell Negative    19. Rough Pigweed Negative    20. Marsh Elder, Rough Negative    21. Mugwort, Common Negative    22. Ash mix Negative    23. Birch mix Negative    24. Beech American Negative    25. Box, Elder Negative    26. Cedar, red Negative    27.  Cottonwood, [place] Negative    28. Elm mix Negative    29. Hickory Negative    30. Maple mix Negative    31. Oak, [place] mix Negative    32. Pecan Pollen Negative    33. Pine mix Negative    34. Sycamore Eastern Negative    35. Nord, Black Pollen Negative    36. Alternaria alternata Negative    37. Cladosporium Herbarum Negative    38. Aspergillus mix Negative    39. Penicillium mix Negative    40. Bipolaris sorokiniana (Helminthosporium) Negative    41. Drechslera spicifera (Curvularia) Negative    42. Mucor plumbeus Negative    43. Fusarium moniliforme Negative    44. Aureobasidium pullulans (pullulara) Negative    45. Rhizopus oryzae Negative    46. Botrytis cinera Negative    47. Epicoccum nigrum Negative    48. Phoma betae Negative    49. Candida Albicans Negative    50. Trichophyton mentagrophytes Negative  51. Mite, D Farinae  5,000 AU/ml Negative    52. Mite, D Pteronyssinus  5,000 AU/ml Negative    53. Cat Hair 10,000 BAU/ml Negative    54.  Dog Epithelia Negative    55. Mixed Feathers Negative    56. Horse Epithelia Negative    57. Cockroach, German Negative    58. Mouse Negative    59. Tobacco Leaf Negative             Food Perc - 04/28/22 1020       Test Information   Time Antigen Placed 1000    Allergen Manufacturer Greer    Location Back    Number of allergen test 10      Food   1. Peanut Negative    2. Soybean food Negative    3. Wheat, whole Negative    4. Sesame Negative    5. Milk, cow Negative    6. Egg White, chicken Negative    7. Casein Negative    8. Shellfish mix Negative    9. Fish mix Negative    10. Cashew Negative             Intradermal - 04/28/22 1021     Time Antigen Placed 1030    Allergen Manufacturer Greer    Location Arm    Number of Test 14    Intradermal Select    Control Negative    [place] 3+    Johnson 2+    Ragweed mix Negative    Weed mix 2+    Tree mix 1+    Mold 1 1+    Mold 2 Negative     Mold 3 Negative    Mold 4 Negative    Cat 1+    Dog Negative    Cockroach Negative    Mite mix Negative             Reducing Pollen Exposure  The American Academy of Allergy, Asthma and Immunology suggests the following steps to reduce your exposure to pollen during allergy seasons.    Do not hang sheets or clothing out to dry; pollen may collect on these items. Do not mow lawns or spend time around freshly cut grass; mowing stirs up pollen. Keep windows closed at night.  Keep car windows closed while driving. Minimize morning activities outdoors, a time when pollen counts are usually at their highest. Stay indoors as much as possible when pollen counts or humidity is high and on windy days when pollen tends to remain in the air longer. Use air conditioning when possible.  Many air conditioners have filters that trap the pollen spores. Use a HEPA room air filter to remove pollen form the indoor air you breathe. Control of Mold Allergen   Mold and fungi can grow on a variety of surfaces provided certain temperature and moisture conditions exist.  Outdoor molds grow on plants, decaying vegetation and soil.  The major outdoor mold, Alternaria and Cladosporium, are found in very high numbers during hot and dry conditions.  Generally, a late Summer - Fall peak is seen for common outdoor fungal spores.  Rain will temporarily lower outdoor mold spore count, but counts rise rapidly when the rainy period ends.  The most important indoor molds are Aspergillus and Penicillium.  Dark, humid and poorly ventilated basements are ideal sites for mold growth.  The next most common sites of mold growth are the bathroom and the kitchen.  Outdoor (Seasonal) Mold Control  Positive outdoor molds via skin testing: Alternaria and Cladosporium  Use air conditioning and keep windows closed Avoid exposure to decaying vegetation. Avoid leaf raking. Avoid grain handling. Consider wearing a face mask if  working in moldy areas.     Control of Dog or Cat Allergen  Avoidance is the best way to manage a dog or cat allergy. If you have a dog or cat and are allergic to dog or cats, consider removing the dog or cat from the home. If you have a dog or cat but don't want to find it a new home, or if your family wants a pet even though someone in the household is allergic, here are some strategies that may help keep symptoms at bay:  Keep the pet out of your bedroom and restrict it to only a few rooms. Be advised that keeping the dog or cat in only one room will not limit the allergens to that room. Don't pet, hug or kiss the dog or cat; if you do, wash your hands with soap and water. High-efficiency particulate air (HEPA) cleaners run continuously in a bedroom or living room can reduce allergen levels over time. Regular use of a high-efficiency vacuum cleaner or a central vacuum can reduce allergen levels. Giving your dog or cat a bath at least once a week can reduce airborne allergen.

## 2022-04-28 NOTE — Progress Notes (Signed)
NEW PATIENT  Date of Service/Encounter:  04/28/22  Consult requested by: Billie Ruddy, MD   Assessment:   Chronic urticaria  Mild intermittent asthma, uncomplicated  Chronic rhinitis  Eczema as child  S/p bariatric surgery October 2022  Uterine fibroids - s/p surgery in July 2023  Plan/Recommendations:    1. Chronic urticaria - Your history does not have any "red flags" such as fevers, joint pains, or permanent skin changes that would be concerning for a more serious cause of hives.  - There were several triggers on testing today, including a number of outdoor allergens. - Thankfully testing to the most common foods was negative. - There is a the low positive predictive value of food allergy testing and hence the high possibility of false positives. - In contrast, food allergy testing has a high negative predictive value, therefore if testing is negative we can be relatively assured that they are indeed negative.  - Copy of testing results provided today.  - We will get some labs to rule out serious causes of hives: alpha gal panel, complete blood count, tryptase level, chronic urticaria panel, CMP, ANA, ESR, and CRP.  - Chronic hives are often times a self limited process and will "burn themselves out" over 6-12 months, although this is not always the case.  - In the meantime, start suppressive dosing of antihistamines:   - Morning: Zyrtec (cetirizine) 54m I  - Evening: Zyrtec (cetirizine) 172mIF NEEDED  - If the above is not working, try adding: Pepcid (famotidine) 2074m You can change this dosing at home, decreasing the dose as needed or increasing the dosing as needed.  - If you are not tolerating the medications or are tired of taking them every day, we can start treatment with a monthly injectable medication called Xolair.  - However, with the long time span between outbreaks, I think Xolair would be an aggressive move at this point in time.   2. Mild  intermittent asthma, uncomplicated - Lung testing looked awesome today. - I do not think that we need a controller medication at this time. - Daily controller medication(s): NONE - Prior to physical activity: albuterol 2 puffs 10-15 minutes before physical activity. - Rescue medications: albuterol 4 puffs every 4-6 hours as needed - Asthma control goals:  * Full participation in all desired activities (may need albuterol before activity) * Albuterol use two time or less a week on average (not counting use with activity) * Cough interfering with sleep two time or less a month * Oral steroids no more than once a year * No hospitalizations  3. Chronic rhinitis - Testing today showed: grasses, weeds, trees, outdoor molds, and cat. - Copy of test results provided.  - Avoidance measures provided. - Start taking: Zyrtec (cetirizine) 4m29mblet once daily (especially in the spring/summer/fall) - You can use an extra dose of the antihistamine, if needed, for breakthrough symptoms.  - Consider nasal saline rinses 1-2 times daily to remove allergens from the nasal cavities as well as help with mucous clearance (this is especially helpful to do before the nasal sprays are given) - Consider allergy shots as a means of long-term control. - Allergy shots "re-train" and "reset" the immune system to ignore environmental allergens and decrease the resulting immune response to those allergens (sneezing, itchy watery eyes, runny nose, nasal congestion, etc).    - Allergy shots improve symptoms in 75-85% of patients.  - We can discuss more at the next appointment if  the medications are not working for you, although your symptoms seem to be relatively minor.   4. Follow up in 2 months or earlier if needed.  This note in its entirety was forwarded to the Provider who requested this consultation.  Subjective:   Mikylah Ackroyd is a 31 y.o. female presenting today for evaluation of  Chief Complaint   Patient presents with   Establish Care   Urticaria    2-3 months ago   Rash    Bonnye Lanora Manis has a history of the following: Patient Active Problem List   Diagnosis Date Noted   Gastro-esophageal reflux disease without esophagitis 04/03/2022   Status post gastric surgery 09/15/2021   Morbid obesity with BMI of 50.0-59.9, adult ([place]) 07/15/2021   Fibroid 08/24/2020   Iron deficiency anemia due to chronic blood loss 08/24/2020   Seasonal allergies 08/24/2020   Polycystic ovary syndrome 04/21/2019   History of hypothyroidism 03/15/2019   Class 3 severe obesity due to excess calories without serious comorbidity with body mass index (BMI) of 50.0 to 59.9 in adult [facility]) 03/15/2019   Prediabetes 02/04/2018   Hypothyroidism 02/04/2018   Excessive bleeding in premenopausal period 02/04/2018   Insulin resistance 09/22/2012    History obtained from: chart review and patient.  Annette Stable Down was referred by Billie Ruddy, MD.     Sierria is a 31 y.o. female presenting for an evaluation of urticaria .  She started having hives in April or May. She went to see her PCP at that time. When she went there, she was prescribed cetirizine and hydrocortisone cream to use. This did help it and she has not had issues since that time. This was NOT the first time that she had hives. She remembers having these back when she was in college at [employer]. This is random and occurs whenever they want to.   When she has hives, they last around one week tops. They do not last for weeks at a time, but they do last for days at a time. She has no breathing issues. Sometimes this occurs in the springtime with congestion and allergies flaring. She does moisturize with Eucerin, which she used as a child when she had eczema.    Asthma/Respiratory Symptom History: She does have an albuterol inhaler. She does cough at night sometimes when she has some drainage. Otherwise she is fine. She thinks that her  albuterol expires. She has never gone through an entire inhaler.   Allergic Rhinitis Symptom History: She does have seasonal allergies that are worse in the spring time. She reports rhinorrhea and drainage. She notes that her face can become a bit puffy at times. She thinks that with the cetirizine, she feels better with it. She does not take it every day. She does get nasal sprays occasionally when her allergies flare up. She does have a saline spray of some sort that she will use. Allergy season gets ugly sometimes, per the patient.   Food Allergy Symptom History: She eats everything for the most part. She does realize that with raspberry dressings, she will have some throat tingling.   She has a history of hypothyroidism. She takes her medication daily. She did have uterine fibroids removed July 12th. She has a history of hepatic adenomas on her liver (two of them), which are felt to be benign. She has had MRIs for these. She is scheduled to have more MRIs in the future. This was all felt to be related to  her Lupron injections which she was getting for her uterine fibroids.   Otherwise, there is no history of other atopic diseases, including drug allergies, stinging insect allergies, eczema, or contact dermatitis. There is no significant infectious history. Vaccinations are up to date.    Past Medical History: Patient Active Problem List   Diagnosis Date Noted   Gastro-esophageal reflux disease without esophagitis 04/03/2022   Status post gastric surgery 09/15/2021   Morbid obesity with BMI of 50.0-59.9, adult ([place]) 07/15/2021   Fibroid 08/24/2020   Iron deficiency anemia due to chronic blood loss 08/24/2020   Seasonal allergies 08/24/2020   Polycystic ovary syndrome 04/21/2019   History of hypothyroidism 03/15/2019   Class 3 severe obesity due to excess calories without serious comorbidity with body mass index (BMI) of 50.0 to 59.9 in adult [facility]) 03/15/2019   Prediabetes 02/04/2018    Hypothyroidism 02/04/2018   Excessive bleeding in premenopausal period 02/04/2018   Insulin resistance 09/22/2012    Medication List:  Allergies as of 04/28/2022   No Known Allergies      Medication List        Accurate as of April 28, 2022 12:05 PM. If you have any questions, ask your nurse or doctor.          albuterol 108 (90 Base) MCG/ACT inhaler Commonly known as: VENTOLIN HFA Inhale 2 puffs into the lungs every 6 (six) hours as needed for wheezing or shortness of breath.   BARIATRIC MULTIVITAMINS/IRON PO Take 1 tablet by mouth daily at 12 noon.   calcium carbonate 750 MG chewable tablet Commonly known as: TUMS EX Chew 750 mg by mouth See admin instructions. Take 750 mg at 2 pm and 750 mg at 4 pm   cetirizine 10 MG tablet Commonly known as: ZYRTEC TAKE 1 TABLET BY MOUTH EVERY DAY   hydrocortisone 2.5 % ointment Apply topically 2 (two) times daily.   lansoprazole 30 MG capsule Commonly known as: PREVACID Take 30 mg by mouth daily.   levothyroxine 125 MCG tablet Commonly known as: SYNTHROID Take 125 mcg by mouth daily before breakfast.   ondansetron 4 MG tablet Commonly known as: Zofran Take 1 tablet (4 mg total) by mouth daily as needed for nausea or vomiting.   oxyCODONE-acetaminophen 7.5-325 MG tablet Commonly known as: Percocet Take 1 tablet by mouth every 4 (four) hours as needed.   pantoprazole 20 MG tablet Commonly known as: Protonix Take 1 tablet (20 mg total) by mouth daily.   sodium fluoride 1.1 % Crea dental cream Commonly known as: PREVIDENT 5000 PLUS Place 1 Application onto teeth at bedtime.   traMADol 50 MG tablet Commonly known as: ULTRAM Take 50 mg by mouth daily as needed for moderate pain.        Birth History: non-contributory  Developmental History: non-contributory  Past Surgical History: Past Surgical History:  Procedure Laterality Date   BIOPSY  05/09/2021   Procedure: BIOPSY;  Surgeon: Thermon Leyland, Nickola Major, MD;   Location: [facility];  Service: General;;   ESOPHAGOGASTRODUODENOSCOPY N/A 05/09/2021   Procedure: ESOPHAGOGASTRODUODENOSCOPY (EGD) with biopsies;  Surgeon: Felicie Morn, MD;  Location: [facility];  Service: General;  Laterality: N/A;   LAPAROSCOPIC GASTRIC SLEEVE RESECTION  07/15/2021   LAPAROSCOPIC GELPORT ASSISTED MYOMECTOMY N/A 04/15/2022   Procedure: LAPAROSCOPIC GELPORT ASSISTED MYOMECTOMY;  Surgeon: Governor Specking, MD;  Location: [place];  Service: Gynecology;  Laterality: N/A;   UPPER GI ENDOSCOPY N/A 07/15/2021   Procedure: UPPER GI ENDOSCOPY;  Surgeon: Felicie Morn, MD;  Location: [facility];  Service: General;  Laterality: N/A;   WISDOM TOOTH EXTRACTION  2011     Family History: Family History  Problem Relation Age of Onset   Hypertension Mother    Thyroid disease Father    Hypertension Maternal Grandfather    Stroke Maternal Grandfather    Colon cancer Neg Hx    Esophageal cancer Neg Hx    Rectal cancer Neg Hx      Social History: Henritta lives at home with her husband. She works at [employer] and [employer]. 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 There is around 7-8 nurses total. She is also a Journalist, newspaper. She has her first birth the day after her birthday. She has an MPH and an undergraduate in Education officer, museum. She and her husband live in an apartment that is around 29 years old. There is carpeting throughout the apartment. They have electric heating and central cooling. There are no animals inside or outside of the home. There are dust mite coverings on the bedding. There is no tobacco exposure in the home. [place] do [place] interstate or industrial area. There is no fume, chemical, or dust exposure in the home.     Review of Systems  Constitutional: Negative.  Negative for chills, fever, malaise/fatigue and weight loss.  HENT:  Positive for  congestion. Negative for ear discharge and ear pain.   Eyes:  Negative for pain, discharge and redness.  Respiratory:  Negative for cough, sputum production, shortness of breath and wheezing.   Cardiovascular: Negative.  Negative for chest pain and palpitations.  Gastrointestinal:  Negative for abdominal pain, blood in stool, constipation, diarrhea, heartburn, nausea and vomiting.  Skin:  Positive for itching and rash.  Neurological:  Negative for dizziness and headaches.  Endo/Heme/Allergies:  Positive for environmental allergies. Does not bruise/bleed easily.       Objective:   Blood pressure 116/76, pulse 71, temperature 97.9 F (36.6 C), resp. rate 16, height _0  (1.575 m), weight 267 lb 8 oz (121.3 kg), SpO2 99 %. Body mass index is 48.93 kg/m.     Physical Exam Vitals reviewed.  Constitutional:      Appearance: She is well-developed and overweight.     Comments: Very talkative.   HENT:     Head: Normocephalic and atraumatic.     Right Ear: Tympanic membrane, ear canal and external ear normal. No drainage, swelling or tenderness. Tympanic membrane is not injected, scarred, erythematous, retracted or bulging.     Left Ear: Tympanic membrane, ear canal and external ear normal. No drainage, swelling or tenderness. Tympanic membrane is not injected, scarred, erythematous, retracted or bulging.     Nose: No nasal deformity, septal deviation, mucosal edema or rhinorrhea.     Right Turbinates: Enlarged, swollen and pale.     Left Turbinates: Enlarged, swollen and pale.     Right Sinus: No maxillary sinus tenderness or frontal sinus tenderness.     Left Sinus: No maxillary sinus tenderness or frontal sinus tenderness.     Mouth/Throat:     Mouth: Mucous membranes are not pale and not dry.     Pharynx: Uvula midline.  Eyes:     General:        Right eye: No discharge.        Left eye: No discharge.     Conjunctiva/sclera: Conjunctivae normal.  Right eye: Right conjunctiva  is not injected. No chemosis.    Left eye: Left conjunctiva is not injected. No chemosis.    Pupils: Pupils are equal, round, and reactive to light.  Cardiovascular:     Rate and Rhythm: Normal rate and regular rhythm.     Heart sounds: Normal heart sounds.  Pulmonary:     Effort: Pulmonary effort is normal. No tachypnea, accessory muscle usage or respiratory distress.     Breath sounds: Normal breath sounds. No wheezing, rhonchi or rales.  Chest:     Chest wall: No tenderness.  Abdominal:     Tenderness: There is no abdominal tenderness. There is no guarding or rebound.  Lymphadenopathy:     Head:     Right side of head: No submandibular, tonsillar or occipital adenopathy.     Left side of head: No submandibular, tonsillar or occipital adenopathy.     Cervical: No cervical adenopathy.  Skin:    General: Skin is warm.     Capillary Refill: Capillary refill takes less than 2 seconds.     Coloration: Skin is not pale.     Findings: No abrasion, erythema, petechiae or rash. Rash is not papular, urticarial or vesicular.     Comments: Tattoo on the upper right back. Otherwise, no rashes including urticaria noted.  Neurological:     Mental Status: She is alert.  Psychiatric:        Behavior: Behavior is cooperative.      Diagnostic studies:    Spirometry: results normal (FEV1: 2.48/95%, FVC: 2.91/96%, FEV1/FVC: 85%).    Spirometry consistent with normal pattern.   Allergy Studies:     Airborne Adult Perc - 04/28/22 1020     Time Antigen Placed 1000    Allergen Manufacturer Lavella Hammock    Location Back    Number of Test 59    1. Control-Buffer 50% Glycerol Negative    2. Control-Histamine 1 mg/ml 2+    3. Albumin saline Negative    4. [place] Negative    5. [place] Negative    6. Johnson Negative    7. [place] Blue 2+    8. Meadow Fescue 2+    9. Perennial Rye 3+    10. Sweet Vernal Negative    11. Timothy Negative    12. Cocklebur Negative    13. Burweed Marshelder  Negative    14. Ragweed, short Negative    15. Ragweed, Giant Negative    16. Plantain,  English Negative    17. Lamb's Quarters Negative    18. Sheep Sorrell Negative    19. Rough Pigweed Negative    20. Marsh Elder, Rough Negative    21. Mugwort, Common Negative    22. Ash mix Negative    23. Birch mix Negative    24. Beech American Negative    25. Box, Elder Negative    26. Cedar, red Negative    27. Cottonwood, [place] Negative    28. Elm mix Negative    29. Hickory Negative    30. Maple mix Negative    31. Oak, [place] mix Negative    32. Pecan Pollen Negative    33. Pine mix Negative    34. Sycamore Eastern Negative    35. [place] King, Black Pollen Negative    36. Alternaria alternata Negative    37. Cladosporium Herbarum Negative    38. Aspergillus mix Negative    39. Penicillium mix Negative    40. Bipolaris sorokiniana (Helminthosporium) Negative  41. Drechslera spicifera (Curvularia) Negative    42. Mucor plumbeus Negative    43. Fusarium moniliforme Negative    44. Aureobasidium pullulans (pullulara) Negative    45. Rhizopus oryzae Negative    46. Botrytis cinera Negative    47. Epicoccum nigrum Negative    48. Phoma betae Negative    49. Candida Albicans Negative    50. Trichophyton mentagrophytes Negative    51. Mite, D Farinae  5,000 AU/ml Negative    52. Mite, D Pteronyssinus  5,000 AU/ml Negative    53. Cat Hair 10,000 BAU/ml Negative    54.  Dog Epithelia Negative    55. Mixed Feathers Negative    56. Horse Epithelia Negative    57. Cockroach, German Negative    58. Mouse Negative    59. Tobacco Leaf Negative             Food Perc - 04/28/22 1020       Test Information   Time Antigen Placed 1000    Allergen Manufacturer Greer    Location Back    Number of allergen test 10      Food   1. Peanut Negative    2. Soybean food Negative    3. Wheat, whole Negative    4. Sesame Negative    5. Milk, cow Negative    6. Egg White, chicken  Negative    7. Casein Negative    8. Shellfish mix Negative    9. Fish mix Negative    10. Cashew Negative             Intradermal - 04/28/22 1021     Time Antigen Placed 1030    Allergen Manufacturer Greer    Location Arm    Number of Test 14    Intradermal Select    Control Negative    [place] 3+    Johnson 2+    Ragweed mix Negative    Weed mix 2+    Tree mix 1+    Mold 1 1+    Mold 2 Negative    Mold 3 Negative    Mold 4 Negative    Cat 1+    Dog Negative    Cockroach Negative    Mite mix Negative             Allergy testing results were read and interpreted by myself, documented by clinical staff.         Salvatore Marvel, MD [facility] [place]

## 2022-05-01 LAB — ALPHA-GAL PANEL
Allergen Lamb IgE: <0.1 kU/L
Beef IgE: <0.1 kU/L
IgE (Immunoglobulin E), Serum: 65 IU/mL (ref 6–495)
O215-IgE Alpha-Gal: <0.1 kU/L
Pork IgE: <0.1 kU/L

## 2022-05-08 LAB — CBC WITH DIFFERENTIAL
Basophils Absolute: 0 10*3/uL (ref 0.0–0.2)
Basos: 0 %
EOS (ABSOLUTE): 0.1 10*3/uL (ref 0.0–0.4)
Eos: 1 %
Hematocrit: 35.4 % (ref 34.0–46.6)
Hemoglobin: 11.2 g/dL (ref 11.1–15.9)
Immature Grans (Abs): 0 10*3/uL (ref 0.0–0.1)
Immature Granulocytes: 0 %
Lymphocytes Absolute: 3.2 10*3/uL — ABNORMAL HIGH (ref 0.7–3.1)
Lymphs: 31 %
MCH: 27.5 pg (ref 26.6–33.0)
MCHC: 31.6 g/dL (ref 31.5–35.7)
MCV: 87 fL (ref 79–97)
Monocytes Absolute: 0.3 10*3/uL (ref 0.1–0.9)
Monocytes: 3 %
Neutrophils Absolute: 6.5 10*3/uL (ref 1.4–7.0)
Neutrophils: 65 %
RBC: 4.07 x10E6/uL (ref 3.77–5.28)
RDW: 13.7 % (ref 11.7–15.4)
WBC: 10.1 10*3/uL (ref 3.4–10.8)

## 2022-05-08 LAB — CMP14+EGFR
ALT: 36 IU/L — ABNORMAL HIGH (ref 0–32)
AST: 16 IU/L (ref 0–40)
Albumin/Globulin Ratio: 1.2 (ref 1.2–2.2)
Albumin: 3.8 g/dL — ABNORMAL LOW (ref 4.0–5.0)
Alkaline Phosphatase: 167 IU/L — ABNORMAL HIGH (ref 44–121)
BUN/Creatinine Ratio: 13 (ref 9–23)
BUN: 12 mg/dL (ref 6–20)
Bilirubin Total: 0.4 mg/dL (ref 0.0–1.2)
CO2: 25 mmol/L (ref 20–29)
Calcium: 9.5 mg/dL (ref 8.7–10.2)
Chloride: 103 mmol/L (ref 96–106)
Creatinine, Ser: 0.9 mg/dL (ref 0.57–1.00)
Globulin, Total: 3.2 g/dL (ref 1.5–4.5)
Glucose: 75 mg/dL (ref 70–99)
Potassium: 4.9 mmol/L (ref 3.5–5.2)
Sodium: 141 mmol/L (ref 134–144)
Total Protein: 7 g/dL (ref 6.0–8.5)
eGFR: 89 mL/min/{1.73_m2} (ref >59)

## 2022-05-08 LAB — THYROID ANTIBODIES
Thyroglobulin Antibody: <1 IU/mL (ref 0.0–0.9)
Thyroperoxidase Ab SerPl-aCnc: 10 IU/mL (ref 0–34)

## 2022-05-08 LAB — SEDIMENTATION RATE: Sed Rate: 59 mm/hr — ABNORMAL HIGH (ref 0–32)

## 2022-05-08 LAB — TRYPTASE: Tryptase: 2.5 ug/L (ref 2.2–13.2)

## 2022-05-08 LAB — C-REACTIVE PROTEIN: CRP: 47 mg/L — ABNORMAL HIGH (ref 0–10)

## 2022-05-08 LAB — CHRONIC URTICARIA: cu index: <1.5 (ref <10)

## 2022-05-08 LAB — ANTINUCLEAR ANTIBODIES, IFA: ANA Titer 1: NEGATIVE

## 2022-05-11 ENCOUNTER — Encounter: Payer: Self-pay | Admitting: Allergy & Immunology

## 2022-05-13 ENCOUNTER — Encounter (INDEPENDENT_AMBULATORY_CARE_PROVIDER_SITE_OTHER): Payer: Self-pay

## 2022-05-20 DIAGNOSIS — F329 Major depressive disorder, single episode, unspecified: Secondary | ICD-10-CM | POA: Diagnosis not present

## 2022-05-28 ENCOUNTER — Other Ambulatory Visit: Payer: Self-pay | Admitting: Surgery

## 2022-05-28 DIAGNOSIS — K7689 Other specified diseases of liver: Secondary | ICD-10-CM

## 2022-06-02 DIAGNOSIS — F329 Major depressive disorder, single episode, unspecified: Secondary | ICD-10-CM | POA: Diagnosis not present

## 2022-06-09 DIAGNOSIS — F329 Major depressive disorder, single episode, unspecified: Secondary | ICD-10-CM | POA: Diagnosis not present

## 2022-06-16 ENCOUNTER — Ambulatory Visit
Admission: RE | Admit: 2022-06-16 | Discharge: 2022-06-16 | Disposition: A | Discharge status: Home / Self Care | Payer: BC Managed Care – PPO | Source: Ambulatory Visit | Attending: Surgery | Admitting: Surgery

## 2022-06-16 DIAGNOSIS — R8290 Unspecified abnormal findings in urine: Secondary | ICD-10-CM | POA: Diagnosis not present

## 2022-06-16 DIAGNOSIS — K7689 Other specified diseases of liver: Secondary | ICD-10-CM

## 2022-06-16 DIAGNOSIS — Z113 Encounter for screening for infections with a predominantly sexual mode of transmission: Secondary | ICD-10-CM | POA: Diagnosis not present

## 2022-06-16 DIAGNOSIS — Z6841 Body Mass Index (BMI) 40.0 and over, adult: Secondary | ICD-10-CM | POA: Diagnosis not present

## 2022-06-16 DIAGNOSIS — Z124 Encounter for screening for malignant neoplasm of cervix: Secondary | ICD-10-CM | POA: Diagnosis not present

## 2022-06-16 DIAGNOSIS — Z01419 Encounter for gynecological examination (general) (routine) without abnormal findings: Secondary | ICD-10-CM | POA: Diagnosis not present

## 2022-06-16 DIAGNOSIS — R8279 Other abnormal findings on microbiological examination of urine: Secondary | ICD-10-CM | POA: Diagnosis not present

## 2022-06-17 LAB — HM PAP SMEAR

## 2022-06-23 DIAGNOSIS — F329 Major depressive disorder, single episode, unspecified: Secondary | ICD-10-CM | POA: Diagnosis not present

## 2022-06-30 ENCOUNTER — Ambulatory Visit: Payer: BC Managed Care – PPO | Admitting: Allergy & Immunology

## 2022-06-30 DIAGNOSIS — F329 Major depressive disorder, single episode, unspecified: Secondary | ICD-10-CM | POA: Diagnosis not present

## 2022-07-09 DIAGNOSIS — F329 Major depressive disorder, single episode, unspecified: Secondary | ICD-10-CM | POA: Diagnosis not present

## 2022-07-13 ENCOUNTER — Encounter: Payer: BC Managed Care – PPO | Admitting: Family Medicine

## 2022-07-14 ENCOUNTER — Ambulatory Visit (INDEPENDENT_AMBULATORY_CARE_PROVIDER_SITE_OTHER): Payer: BC Managed Care – PPO | Admitting: Allergy & Immunology

## 2022-07-14 VITALS — BP 122/82 | HR 70 | Temp 98.2°F | Resp 16 | Ht 62.5 in | Wt 289.6 lb

## 2022-07-14 DIAGNOSIS — L508 Other urticaria: Secondary | ICD-10-CM

## 2022-07-14 DIAGNOSIS — F329 Major depressive disorder, single episode, unspecified: Secondary | ICD-10-CM | POA: Diagnosis not present

## 2022-07-14 DIAGNOSIS — J302 Other seasonal allergic rhinitis: Secondary | ICD-10-CM

## 2022-07-14 DIAGNOSIS — L309 Dermatitis, unspecified: Secondary | ICD-10-CM

## 2022-07-14 DIAGNOSIS — J4520 Mild intermittent asthma, uncomplicated: Secondary | ICD-10-CM

## 2022-07-14 DIAGNOSIS — J3089 Other allergic rhinitis: Secondary | ICD-10-CM

## 2022-07-14 NOTE — Progress Notes (Signed)
FOLLOW UP  Date of Service/Encounter:  07/14/22   Assessment:   Chronic urticaria   Mild intermittent asthma, uncomplicated   Perennial and seasonal allergic rhinitis (grasses, weeds, trees, outdoor molds, and cat)   Eczema as child   S/p bariatric surgery October 2022   Uterine fibroids - s/p surgery in July 2023  Interested in going to medical school    Plan/Recommendations:   1. Chronic urticaria - with elevated inflammatory markers - I am glad that you are doing well. - Send [place] labs from your liver doctor to make sure that your inflammatory markers are better.  - In the meantime, start suppressive dosing of antihistamines:   - Morning: Zyrtec (cetirizine) 79m   - Evening: Zyrtec (cetirizine) 183mIF NEEDED - You can change this dosing at home, decreasing the dose as needed or increasing the dosing as needed.   2. Mild intermittent asthma, uncomplicated - Lung testing looks great today.  - Daily controller medication(s): NONE - Prior to physical activity: albuterol 2 puffs 10-15 minutes before physical activity. - Rescue medications: albuterol 4 puffs every 4-6 hours as needed - Asthma control goals:  * Full participation in all desired activities (may need albuterol before activity) * Albuterol use two time or less a week on average (not counting use with activity) * Cough interfering with sleep two time or less a month * Oral steroids no more than once a year * No hospitalizations  3. Chronic rhinitis - Previous testing showed: grasses, weeds, trees, outdoor molds, and cat - Continue taking: Zyrtec (cetirizine) 1029mablet once daily (especially in the spring/summer/fall) - Add on Ryaltris one spray per nostril daily (we will send this in, but do not pick it up if it is too expensive and give us [place] back).  4. Return in about 6 months (around 01/13/2023).     Subjective:   Christine Nelson a 31 17o. female presenting today for follow up of   Chief Complaint  Patient presents with   Asthma    2 mth f/u - Okay   Chronic Urticaria    2 mth f/u - No issues   Chronic Rhinitis    Okay but patient states her nose feels very dry - Zyrtec not working.    Christine Nelson has a history of the following: Patient Active Problem List   Diagnosis Date Noted   Chronic urticaria 07/16/2022   Mild intermittent asthma, uncomplicated 10/40/98/1191Seasonal and perennial allergic rhinitis 07/16/2022   Gastro-esophageal reflux disease without esophagitis 04/03/2022   Status post gastric surgery 09/15/2021   Morbid obesity with BMI of 50.0-59.9, adult ([facility]0/08/2021   Fibroid 08/24/2020   Iron deficiency anemia due to chronic blood loss 08/24/2020   Seasonal allergies 08/24/2020   Polycystic ovary syndrome 04/21/2019   History of hypothyroidism 03/15/2019   Class 3 severe obesity due to excess calories without serious comorbidity with body mass index (BMI) of 50.0 to 59.9 in adult ([facility] [place]   Prediabetes 02/04/2018   Hypothyroidism 02/04/2018   Excessive bleeding in premenopausal period 02/04/2018   Insulin resistance 09/22/2012    History obtained from: chart review and patient.  Christine Nelson a [age]o. female presenting for a follow up visit.  She was last seen in July 2023.  At that time, we did testing to the most common foods which were negative for evaluation of her chronic urticaria.  She has had a number of environmental allergens that were positive.  We started her on suppressive doses of antihistamine and obtain some lab work.  For her asthma, her lung testing looked great.  We continued with albuterol as needed.  Since last visit, she has done well.   Asthma/Respiratory Symptom History: Asthma has been well controlled.  Christine Nelson's asthma has been well controlled. She has not required rescue medication, experienced nocturnal awakenings due to lower respiratory symptoms, nor have activities of daily living been limited.  She has required no Emergency Department or Urgent Care visits for her asthma. She has required zero courses of systemic steroids for asthma exacerbations since the last visit. ACT score today is 25, indicating excellent asthma symptom control.   Allergic Rhinitis Symptom History: She has been having more mucous in her throat from the postnasal drip. She has noticed that changing her eating has helped control her symptoms as well. She is unsure that cetirizine is working   Skin Symptom History: She has been using her cetirizine.  She never did see Rheumatology since her outbreaks are better. ANA was negative, but her inflammatory markers were elevated. This might be related to her liver issues.  She has a history of hepatic steatosis.  She also has arterially enhancing lesions in her liver.  Her last MRI was in May 2023.  She was supposed to have a follow-up one in September, but they can never start an IV so it was rescheduled.  She knows that her CRP and ESR were elevated at that time.  She still wants to do medical school.  She is working on her prerequisites right now.  She is going to be taking the MCAT seen.  Otherwise, there have been no changes to her past medical history, surgical history, family history, or social history.    Review of Systems  Constitutional: Negative.  Negative for chills, fever, malaise/fatigue and weight loss.  HENT:  Positive for congestion. Negative for ear discharge and ear pain.   Eyes:  Negative for pain, discharge and redness.  Respiratory:  Negative for cough, sputum production, shortness of breath and wheezing.   Cardiovascular: Negative.  Negative for chest pain and palpitations.  Gastrointestinal:  Negative for abdominal pain, blood in stool, constipation, diarrhea, heartburn, nausea and vomiting.  Skin:  Positive for itching and rash.  Neurological:  Negative for dizziness and headaches.  Endo/Heme/Allergies:  Positive for environmental allergies. Does  not bruise/bleed easily.       Objective:   Blood pressure 122/82, pulse 70, temperature 98.2 F (36.8 C), resp. rate 16, height 5' 2.5" (1.588 m), weight 289 lb 9.6 oz (131.4 kg), SpO2 100 %. Body mass index is 52.12 kg/m.    Physical Exam Vitals reviewed.  Constitutional:      Appearance: She is well-developed and overweight.     Comments: Very talkative.   HENT:     Head: Normocephalic and atraumatic.     Right Ear: Tympanic membrane, ear canal and external ear normal. No drainage, swelling or tenderness. Tympanic membrane is not injected, scarred, erythematous, retracted or bulging.     Left Ear: Tympanic membrane, ear canal and external ear normal. No drainage, swelling or tenderness. Tympanic membrane is not injected, scarred, erythematous, retracted or bulging.     Nose: No nasal deformity, septal deviation, mucosal edema or rhinorrhea.     Right Turbinates: Enlarged, swollen and pale.     Left Turbinates: Enlarged, swollen and pale.     Right Sinus: No maxillary sinus tenderness or frontal sinus tenderness.  Left Sinus: No maxillary sinus tenderness or frontal sinus tenderness.     Mouth/Throat:     Lips: Pink.     Mouth: Mucous membranes are moist. Mucous membranes are not pale and not dry.     Pharynx: Uvula midline.  Eyes:     General: Allergic shiner present.        Right eye: No discharge.        Left eye: No discharge.     Conjunctiva/sclera: Conjunctivae normal.     Right eye: Right conjunctiva is not injected. No chemosis.    Left eye: Left conjunctiva is not injected. No chemosis.    Pupils: Pupils are equal, round, and reactive to light.  Cardiovascular:     Rate and Rhythm: Normal rate and regular rhythm.     Heart sounds: Normal heart sounds.  Pulmonary:     Effort: Pulmonary effort is normal. No tachypnea, accessory muscle usage or respiratory distress.     Breath sounds: Normal breath sounds. No wheezing, rhonchi or rales.     Comments: Moving  air well in all lung fields. Chest:     Chest wall: No tenderness.  Abdominal:     Tenderness: There is no abdominal tenderness. There is no guarding or rebound.  Lymphadenopathy:     Head:     Right side of head: No submandibular, tonsillar or occipital adenopathy.     Left side of head: No submandibular, tonsillar or occipital adenopathy.     Cervical: No cervical adenopathy.  Skin:    General: Skin is warm.     Capillary Refill: Capillary refill takes less than 2 seconds.     Coloration: Skin is not pale.     Findings: No abrasion, erythema, petechiae or rash. Rash is not papular, urticarial or vesicular.     Comments: Tattoo on the upper right back. Otherwise, no rashes including urticaria noted.  Neurological:     Mental Status: She is alert.  Psychiatric:        Behavior: Behavior is cooperative.      Diagnostic studies:    Spirometry: results normal (FEV1: 2.49/96%, FVC: 3.04/100%, FEV1/FVC: 82%).    Spirometry consistent with normal pattern.    Allergy Studies: none        Salvatore Marvel, MD  [facility]

## 2022-07-14 NOTE — Patient Instructions (Addendum)
1. Chronic urticaria - with elevated inflammatory markers - I am glad that you are doing well. - Send [place] labs from your liver doctor to make sure that your inflammatory markers are better.  - In the meantime, start suppressive dosing of antihistamines:   - Morning: Zyrtec (cetirizine) '10mg'$    - Evening: Zyrtec (cetirizine) '10mg'$  IF NEEDED - You can change this dosing at home, decreasing the dose as needed or increasing the dosing as needed.   2. Mild intermittent asthma, uncomplicated - Lung testing looks great today.  - Daily controller medication(s): NONE - Prior to physical activity: albuterol 2 puffs 10-15 minutes before physical activity. - Rescue medications: albuterol 4 puffs every 4-6 hours as needed - Asthma control goals:  * Full participation in all desired activities (may need albuterol before activity) * Albuterol use two time or less a week on average (not counting use with activity) * Cough interfering with sleep two time or less a month * Oral steroids no more than once a year * No hospitalizations  3. Chronic rhinitis - Previous testing showed: grasses, weeds, trees, outdoor molds, and cat - Continue taking: Zyrtec (cetirizine) '10mg'$  tablet once daily (especially in the spring/summer/fall) - Add on Ryaltris one spray per nostril daily (we will send this in, but do not pick it up if it is too expensive and give [place] a call back).  4. Return in about 6 months (around 01/13/2023).    Please inform us of any Emergency Department visits, hospitalizations, or changes in symptoms. Call us before going to the ED for breathing or allergy symptoms since we might be able to fit you in for a sick visit. Feel free to contact us anytime with any questions, problems, or concerns.  It was a pleasure to see you again today!   Websites that have reliable patient information: 1. American Academy of Asthma, Allergy, and Immunology: www.aaaai.org 2. Food Allergy Research and Education (FARE):  foodallergy.org 3. Mothers of Asthmatics: http://www.asthmacommunitynetwork.org 4. American College of Allergy, Asthma, and Immunology: www.acaai.org   COVID-19 Vaccine Information can be found at: ShippingScam.co.uk For questions related to vaccine distribution or appointments, please email [email or web address] or call [phone].   We realize that you might be concerned about having an allergic reaction to the COVID19 vaccines. To help with that concern, WE ARE OFFERING THE COVID19 VACCINES IN OUR OFFICE! Ask the front desk for dates!     "Like" [place] on Facebook and Instagram for our latest updates!      A healthy democracy works best when [employer] participate! Make sure you are registered to vote! If you have moved or changed any of your contact information, you will need to get this updated before voting!  In some cases, you MAY be able to register to vote online: [email or web address]

## 2022-07-16 ENCOUNTER — Encounter: Payer: BC Managed Care – PPO | Admitting: Skilled Nursing Facility1

## 2022-07-16 ENCOUNTER — Encounter: Payer: Self-pay | Admitting: Allergy & Immunology

## 2022-07-16 DIAGNOSIS — J4520 Mild intermittent asthma, uncomplicated: Secondary | ICD-10-CM | POA: Insufficient documentation

## 2022-07-16 DIAGNOSIS — J302 Other seasonal allergic rhinitis: Secondary | ICD-10-CM | POA: Insufficient documentation

## 2022-07-16 DIAGNOSIS — L508 Other urticaria: Secondary | ICD-10-CM | POA: Insufficient documentation

## 2022-07-16 HISTORY — DX: Other seasonal allergic rhinitis: J30.2

## 2022-07-16 MED ORDER — RYALTRIS 665-25 MCG/ACT NA SUSP: 1.0000 | Freq: Every day | NASAL | 5 refills | Status: DC | PRN

## 2022-07-21 ENCOUNTER — Telehealth: Payer: Self-pay

## 2022-07-21 DIAGNOSIS — F329 Major depressive disorder, single episode, unspecified: Secondary | ICD-10-CM | POA: Diagnosis not present

## 2022-07-21 NOTE — Telephone Encounter (Signed)
--  Caller states she needs to schedule an appt. She thinks she was bit by a spider. Her pinky finger on her right hand started swelling up and has pain and it looks like there is a bruise. This happened today.  07/20/2022 2:35:43 PM SEE PCP WITHIN 3 DAYS Tito Dine, RN, Deborah  Referrals REFERRED TO PCP OFFICE  Pt has appt with PCP on 07/22/22

## 2022-07-22 ENCOUNTER — Ambulatory Visit (INDEPENDENT_AMBULATORY_CARE_PROVIDER_SITE_OTHER): Payer: BC Managed Care – PPO | Admitting: Family Medicine

## 2022-07-22 VITALS — BP 100/68 | HR 85 | Temp 98.7°F | Wt 288.6 lb

## 2022-07-22 DIAGNOSIS — N912 Amenorrhea, unspecified: Secondary | ICD-10-CM

## 2022-07-22 DIAGNOSIS — R160 Hepatomegaly, not elsewhere classified: Secondary | ICD-10-CM

## 2022-07-22 DIAGNOSIS — E039 Hypothyroidism, unspecified: Secondary | ICD-10-CM | POA: Diagnosis not present

## 2022-07-22 DIAGNOSIS — R002 Palpitations: Secondary | ICD-10-CM | POA: Diagnosis not present

## 2022-07-22 DIAGNOSIS — Z9889 Other specified postprocedural states: Secondary | ICD-10-CM | POA: Diagnosis not present

## 2022-07-22 DIAGNOSIS — E282 Polycystic ovarian syndrome: Secondary | ICD-10-CM

## 2022-07-22 DIAGNOSIS — Z9884 Bariatric surgery status: Secondary | ICD-10-CM

## 2022-07-22 DIAGNOSIS — S60466A Insect bite (nonvenomous) of right little finger, initial encounter: Secondary | ICD-10-CM

## 2022-07-22 DIAGNOSIS — W57XXXA Bitten or stung by nonvenomous insect and other nonvenomous arthropods, initial encounter: Secondary | ICD-10-CM

## 2022-07-22 NOTE — Progress Notes (Signed)
Subjective:    Patient ID: Christine Nelson, female    DOB: 05-02-1991, 31 y.o.   MRN: [id number]  Chief Complaint  Patient presents with   Insect Bite    Right pinky finger started swelling, had a bruise, sore in the one spot. A little sore to touch, all happened on Monday when moving boxes at work. No nausea or vomiting. Just really sore. Just washed hands and kept clean.    HPI Patient  is a 31 yo female with pmh sig for PCOS, obesity s/p gastric sleeve, hypothyroidism, GERD, fibroids, liver masses, vit D def, asthma, seasonal allergies who was seen today for acute concerns.  Initially scheduled appointment after noticing soreness, edema, and bruising on right fifth digit.  Pt thinks something bit her.  Happened after moving boxes at work 2 days ago.  Symptoms now improving.  Pt notes recent irregular heartbeat. In the past patient seen by endocrinology, told she had hypothyroidism.  Taking synthroid 1st thing in am.  Taking PPI around 11 or 11:30 AM, multivitamin 4 hours after Synthroid and calcium 2 hours after that. The pt also  endorses absent menses.  Requesting pregnancy test. Has history of PCOS and irregular bleeding due to fibroids.  Denies increased stress though going through a divorce.  Pt inquires about liver masses previously noted on imaging.  Pt still interested in attending an [place] for medical school.  In a post bac program at [employer], but has questions about pre-recs and application process.   Past Medical History:  Diagnosis Date   Asthma    with allergy season   Fibroids    GERD (gastroesophageal reflux disease)    History of prediabetes    Hypothyroidism    Liver masses    followed by Michaelle Birks MD .   Obesity    PCOS (polycystic ovarian syndrome)    Uterine leiomyoma    Vitamin D deficiency     No Known Allergies  ROS General: Denies fever, chills, night sweats, changes in weight, changes in appetite HEENT: Denies headaches, ear pain, changes in  vision, rhinorrhea, sore throat CV: Denies CP, SOB, orthopnea  +palpitations Pulm: Denies SOB, cough, wheezing GI: Denies abdominal pain, nausea, vomiting, diarrhea, constipation GU: Denies dysuria, hematuria, frequency, vaginal discharge  +absent menses Msk: Denies muscle cramps, joint pains Neuro: Denies weakness, numbness, tingling Skin: Denies rashes, bruising  +insect bite on finger Psych: Denies depression, anxiety, hallucinations     Objective:    Blood pressure 100/68, pulse 85, temperature 98.7 F (37.1 C), temperature source Oral, weight 288 lb 9.6 oz (130.9 kg), SpO2 99 %.  Gen. Pleasant, well-nourished, in no distress, normal affect   HEENT: [place]/AT, face symmetric, conjunctiva clear, no scleral icterus, PERRLA, EOMI, nares patent without drainage. Lungs: no accessory muscle use, CTAB, no wheezes or rales Cardiovascular: RRR, no m/r/g, no peripheral edema Abdomen: BS present, soft, NT/ND Musculoskeletal: No deformities, no cyanosis or clubbing, normal tone Neuro:  A&Ox3, CN II-XII intact, normal gait Skin:  Warm, no lesions/ rash   Wt Readings from Last 3 Encounters:  07/22/22 288 lb 9.6 oz (130.9 kg)  07/14/22 289 lb 9.6 oz (131.4 kg)  04/28/22 267 lb 8 oz (121.3 kg)    Lab Results  Component Value Date   WBC 10.1 04/28/2022   HGB 11.2 04/28/2022   HCT 35.4 04/28/2022   PLT 322 04/09/2022   GLUCOSE 75 04/28/2022   CHOL 153 05/01/2021   TRIG 62.0 05/01/2021   HDL 41.10 05/01/2021  [place] 99 05/01/2021   ALT 36 (H) 04/28/2022   AST 16 04/28/2022   NA 141 04/28/2022   K 4.9 04/28/2022   CL 103 04/28/2022   CREATININE 0.90 04/28/2022   BUN 12 04/28/2022   CO2 25 04/28/2022   TSH 0.90 10/01/2021   HGBA1C 5.3 10/01/2021    Assessment/Plan:  Palpitations -RRR on exam -dicussed possible causes including electrolyte abnormalities, thyroid dysfunction, medications, foods -advised to limit caffeine intake -continue synthroid 125 mcg -adjust dose if  needed based on labs  - Plan: CBC with Differential/Platelet, TSH, CMP  Acquired hypothyroidism -continue synthroid 125 mcg  - Plan: TSH  Absent menses -possible due to h/o PCOS or thyroid dysfunction.  Also consider pregnancy  - Plan: POCT urine pregnancy  Liver mass -Stable -U/S 09/12/21 with heterogenous hepatic echotexture, favored to be secondary to multiple solid masses. -MR liver w wo contrast 10/16/21 with bilobar arterially enhancing hepatic lesions measuring up to 8.2 cm, two of which are subscapular in location.  Given pt's age almost certainly reflect benign hepatic adenomas or focal nodular hyperplasia, which in the setting of hepatic adenoma given their size and location these would be worrisome for spontaneous hemorrhage, consider surgical consult.  24-monthfollow-up MRI suggested.  Hepatic steatosis also noted. -Interval follow-up MRIs advised. -Continue follow-up with GI and general surgery - Plan: CMP  Status post gastric surgery -Bariatric MVI daily  - Plan: CBC with Differential/Platelet, Vitamin B12, Vitamin D, 25-hydroxy  PCOS (polycystic ovarian syndrome) -Stable -Continue follow-up with OB/GYN  Insect bite of right little finger, initial encounter -resolved -continue to monitor  F/u prn  SGrier Mitts MD

## 2022-07-24 ENCOUNTER — Other Ambulatory Visit: Payer: BC Managed Care – PPO

## 2022-08-03 ENCOUNTER — Encounter: Payer: BC Managed Care – PPO | Admitting: Family Medicine

## 2022-08-04 DIAGNOSIS — F329 Major depressive disorder, single episode, unspecified: Secondary | ICD-10-CM | POA: Diagnosis not present

## 2022-08-06 ENCOUNTER — Encounter: Payer: Self-pay | Admitting: Family Medicine

## 2022-08-06 LAB — POCT URINE PREGNANCY: Preg Test, Ur: NEGATIVE

## 2022-08-07 DIAGNOSIS — Z309 Encounter for contraceptive management, unspecified: Secondary | ICD-10-CM | POA: Diagnosis not present

## 2022-08-07 DIAGNOSIS — Z3202 Encounter for pregnancy test, result negative: Secondary | ICD-10-CM | POA: Diagnosis not present

## 2022-08-12 ENCOUNTER — Ambulatory Visit (INDEPENDENT_AMBULATORY_CARE_PROVIDER_SITE_OTHER): Payer: BC Managed Care – PPO | Admitting: Family Medicine

## 2022-08-12 ENCOUNTER — Encounter: Payer: Self-pay | Admitting: Family Medicine

## 2022-08-12 VITALS — BP 116/82 | HR 86 | Temp 98.4°F | Ht 64.0 in | Wt 295.5 lb

## 2022-08-12 DIAGNOSIS — Z113 Encounter for screening for infections with a predominantly sexual mode of transmission: Secondary | ICD-10-CM | POA: Diagnosis not present

## 2022-08-12 DIAGNOSIS — Z0000 Encounter for general adult medical examination without abnormal findings: Secondary | ICD-10-CM

## 2022-08-12 DIAGNOSIS — Z6841 Body Mass Index (BMI) 40.0 and over, adult: Secondary | ICD-10-CM

## 2022-08-12 DIAGNOSIS — L918 Other hypertrophic disorders of the skin: Secondary | ICD-10-CM

## 2022-08-12 NOTE — Progress Notes (Signed)
Subjective:     Christine Nelson is a 31 y.o. female and is here for a comprehensive physical exam. The patient reports weight gain.  Pt status post gastric procedure a few years ago.  Pt going through a divorce.  Denies stress eating.  States may be eating late at night, around 11 PM more when bf gets off work.  Patient looking for jobs in public health.  Pt with a skin tag.  Inquires about removal.  Social History   Socioeconomic History   Marital status: Legally Separated    Spouse name: Amie [place]   Number of children: 0   Years of education: Not on file   Highest education level: Master's degree (e.g., MA, MS, MEng, MEd, MSW, MBA)  Occupational History   Occupation: student   Occupation: Administrative Asst for [employer]  Tobacco Use   Smoking status: Never   Smokeless tobacco: Never  Vaping Use   Vaping Use: Never used  Substance and Sexual Activity   Alcohol use: Never   Drug use: Never   Sexual activity: Yes    Partners: Male    Birth control/protection: Injection  Other Topics Concern   Not on file  Social History Narrative   Pt is single.  She has a boyfriend.  Pt graduated college with a degree in Biology.  Pt has always wanted to become a physician.  Pt was teaching Biology.  She is now working part-time at [employer].         Social Determinants of Health   Financial Resource Strain: Medium Risk (12/31/2021)   Overall Financial Resource Strain (CARDIA)    Difficulty of Paying Living Expenses: Somewhat hard  Food Insecurity: No Food Insecurity (12/31/2021)   Hunger Vital Sign    Worried About Running Out of Food in the Last Year: Never true    Ran Out of Food in the Last Year: Never true  Transportation Needs: No Transportation Needs (12/31/2021)   PRAPARE - Hydrologist (Medical): No    Lack of Transportation (Non-Medical): No  Physical Activity: Sufficiently Active (12/31/2021)   Exercise Vital Sign    Days of Exercise  per Week: 3 days    Minutes of Exercise per Session: 60 min  Stress: No Stress Concern Present (12/31/2021)   [place]    Feeling of Stress : Only a little  Social Connections: Moderately Integrated (12/31/2021)   Social Connection and Isolation Panel [NHANES]    Frequency of Communication with Friends and Family: More than three times a week    Frequency of Social Gatherings with Friends and Family: Once a week    Attends Religious Services: More than 4 times per year    Active Member of [employer] or Organizations: No    Attends Music therapist: Not on file    Marital Status: Married  Human resources officer Violence: Not on file   Health Maintenance  Topic Date Due   COVID-19 Vaccine (1) Never done   HIV Screening  Never done   Diabetic kidney evaluation - Urine ACR  Never done   Hepatitis C Screening  Never done   PAP-Cervical Cytology Screening  Never done   TETANUS/TDAP  11/05/2018   PAP SMEAR-Modifier  02/01/2021   INFLUENZA VACCINE  05/05/2022   Diabetic kidney evaluation - GFR measurement  04/29/2023   HPV VACCINES  Completed    The following portions of the patient's history were  reviewed and updated as appropriate: allergies, current medications, past family history, past medical history, past social history, past surgical history, and problem list.  Review of Systems Pertinent items noted in HPI and remainder of comprehensive ROS otherwise negative.   Objective:    BP 116/82 (BP Location: Left Arm, Patient Position: Sitting, Cuff Size: Large)   Pulse 86   Temp 98.4 F (36.9 C) (Oral)   Ht '5\' 4"'$  (1.626 m)   Wt 295 lb 8 oz (134 kg)   LMP 06/04/2022   SpO2 99%   BMI 50.72 kg/m  General appearance: alert, cooperative, and no distress Head: Normocephalic, without obvious abnormality, atraumatic Eyes: conjunctivae/corneas clear. PERRL, EOM's intact. Fundi benign. Ears: normal TM's and external  ear canals both ears Nose: Nares normal. Septum midline. Mucosa normal. No drainage or sinus tenderness. Throat: lips, mucosa, and tongue normal; teeth and gums normal Neck: no adenopathy, no carotid bruit, no JVD, supple, symmetrical, trachea midline, and thyroid not enlarged, symmetric, no tenderness/mass/nodules Lungs: clear to auscultation bilaterally Heart: regular rate and rhythm, S1, S2 normal, no murmur, click, rub or gallop Abdomen: soft, non-tender; bowel sounds normal; no masses,  no organomegaly Extremities: extremities normal, atraumatic, no cyanosis or edema Pulses: 2+ and symmetric Skin: Skin color, texture, turgor normal. No rashes or lesions skin tag right medial upper arm proximal to axilla Lymph nodes: Cervical, supraclavicular, and axillary nodes normal. Neurologic: Alert and oriented X 3, normal strength and tone. Normal symmetric reflexes. Normal coordination and gait    Assessment:    Healthy female exam.      Plan:    Anticipatory guidance given including wearing seatbelts, smoke detectors in the home, increasing physical activity, increasing p.o. intake of water and vegetables. -labs.  Several lab orders previously placed to be drawn this visit. -Immunizations reviewed. See After Visit Summary for Counseling Recommendations  Well adult exam  Routine screening for STI (sexually transmitted infection)  - Plan: HIV Antibody (routine testing w rflx), RPR  Morbid obesity with BMI of 50.0-59.9, adult (HCC) -Body mass index is 50.72 kg/m. -s/p gastric bypass -Discussed avoiding eating late at night. -Patient encouraged to increase physical activity  - Plan: Lipid panel  Skin tag -will schedule follow-up removal via cauterization given wide base.  Follow-up as needed for skin tag removal  Grier Mitts, MD

## 2022-08-14 ENCOUNTER — Other Ambulatory Visit (INDEPENDENT_AMBULATORY_CARE_PROVIDER_SITE_OTHER): Payer: BC Managed Care – PPO

## 2022-08-14 DIAGNOSIS — Z113 Encounter for screening for infections with a predominantly sexual mode of transmission: Secondary | ICD-10-CM | POA: Diagnosis not present

## 2022-08-14 DIAGNOSIS — Z6841 Body Mass Index (BMI) 40.0 and over, adult: Secondary | ICD-10-CM | POA: Diagnosis not present

## 2022-08-14 LAB — COMPREHENSIVE METABOLIC PANEL
ALT: 17 U/L (ref 0–35)
AST: 17 U/L (ref 0–37)
Albumin: 3.9 g/dL (ref 3.5–5.2)
Alkaline Phosphatase: 80 U/L (ref 39–117)
BUN: 9 mg/dL (ref 6–23)
CO2: 28 mEq/L (ref 19–32)
Calcium: 8.6 mg/dL (ref 8.4–10.5)
Chloride: 103 mEq/L (ref 96–112)
Creatinine, Ser: 0.9 mg/dL (ref 0.40–1.20)
GFR: 86.61 mL/min (ref >60.00)
Glucose, Bld: 84 mg/dL (ref 70–99)
Potassium: 3.8 mEq/L (ref 3.5–5.1)
Sodium: 138 mEq/L (ref 135–145)
Total Bilirubin: 0.5 mg/dL (ref 0.2–1.2)
Total Protein: 7 g/dL (ref 6.0–8.3)

## 2022-08-14 LAB — LIPID PANEL
Cholesterol: 150 mg/dL (ref 0–200)
HDL: 49.9 mg/dL (ref >39.00)
LDL Cholesterol: 90 mg/dL (ref 0–99)
NonHDL: 100.55
Total CHOL/HDL Ratio: 3
Triglycerides: 53 mg/dL (ref 0.0–149.0)
VLDL: 10.6 mg/dL (ref 0.0–40.0)

## 2022-08-14 LAB — CBC WITH DIFFERENTIAL/PLATELET
Basophils Absolute: 0 10*3/uL (ref 0.0–0.1)
Basophils Relative: 0.3 % (ref 0.0–3.0)
Eosinophils Absolute: 0.1 10*3/uL (ref 0.0–0.7)
Eosinophils Relative: 0.9 % (ref 0.0–5.0)
HCT: 36.5 % (ref 36.0–46.0)
Hemoglobin: 11.6 g/dL — ABNORMAL LOW (ref 12.0–15.0)
Lymphocytes Relative: 37.6 % (ref 12.0–46.0)
Lymphs Abs: 2.6 10*3/uL (ref 0.7–4.0)
MCHC: 31.6 g/dL (ref 30.0–36.0)
MCV: 81.8 fl (ref 78.0–100.0)
Monocytes Absolute: 0.3 10*3/uL (ref 0.1–1.0)
Monocytes Relative: 3.9 % (ref 3.0–12.0)
Neutro Abs: 4 10*3/uL (ref 1.4–7.7)
Neutrophils Relative %: 57.3 % (ref 43.0–77.0)
Platelets: 315 10*3/uL (ref 150.0–400.0)
RBC: 4.47 Mil/uL (ref 3.87–5.11)
RDW: 15.8 % — ABNORMAL HIGH (ref 11.5–15.5)
WBC: 6.9 10*3/uL (ref 4.0–10.5)

## 2022-08-14 LAB — VITAMIN B12: Vitamin B-12: 314 pg/mL (ref 211–911)

## 2022-08-14 LAB — VITAMIN D 25 HYDROXY (VIT D DEFICIENCY, FRACTURES): VITD: 23.8 ng/mL — ABNORMAL LOW (ref 30.00–100.00)

## 2022-08-14 LAB — TSH: TSH: 2.75 u[IU]/mL (ref 0.35–5.50)

## 2022-08-15 LAB — HIV ANTIBODY (ROUTINE TESTING W REFLEX): HIV 1&2 Ab, 4th Generation: NONREACTIVE

## 2022-08-15 LAB — RPR: RPR Ser Ql: NONREACTIVE

## 2022-08-17 ENCOUNTER — Other Ambulatory Visit: Payer: Self-pay | Admitting: Family Medicine

## 2022-08-17 DIAGNOSIS — E559 Vitamin D deficiency, unspecified: Secondary | ICD-10-CM

## 2022-08-17 MED ORDER — VITAMIN D (ERGOCALCIFEROL) 1.25 MG (50000 UNIT) PO CAPS: 50000.0000 [IU] | ORAL_CAPSULE | ORAL | 0 refills | Status: DC

## 2022-08-20 DIAGNOSIS — Z113 Encounter for screening for infections with a predominantly sexual mode of transmission: Secondary | ICD-10-CM | POA: Diagnosis not present

## 2022-08-20 DIAGNOSIS — N946 Dysmenorrhea, unspecified: Secondary | ICD-10-CM | POA: Diagnosis not present

## 2022-08-24 DIAGNOSIS — F329 Major depressive disorder, single episode, unspecified: Secondary | ICD-10-CM | POA: Diagnosis not present

## 2022-09-02 DIAGNOSIS — F329 Major depressive disorder, single episode, unspecified: Secondary | ICD-10-CM | POA: Diagnosis not present

## 2022-09-07 ENCOUNTER — Encounter (HOSPITAL_BASED_OUTPATIENT_CLINIC_OR_DEPARTMENT_OTHER): Payer: Self-pay

## 2022-09-07 ENCOUNTER — Emergency Department (HOSPITAL_BASED_OUTPATIENT_CLINIC_OR_DEPARTMENT_OTHER): Payer: BC Managed Care – PPO

## 2022-09-07 ENCOUNTER — Other Ambulatory Visit: Payer: Self-pay

## 2022-09-07 ENCOUNTER — Emergency Department (HOSPITAL_BASED_OUTPATIENT_CLINIC_OR_DEPARTMENT_OTHER)
Admission: EM | Admit: 2022-09-07 | Discharge: 2022-09-07 | Disposition: A | Discharge status: Home / Self Care | Payer: BC Managed Care – PPO | Attending: Emergency Medicine | Admitting: Emergency Medicine

## 2022-09-07 DIAGNOSIS — R109 Unspecified abdominal pain: Secondary | ICD-10-CM | POA: Diagnosis not present

## 2022-09-07 DIAGNOSIS — R1013 Epigastric pain: Secondary | ICD-10-CM | POA: Diagnosis not present

## 2022-09-07 DIAGNOSIS — K7689 Other specified diseases of liver: Secondary | ICD-10-CM | POA: Diagnosis not present

## 2022-09-07 DIAGNOSIS — K769 Liver disease, unspecified: Secondary | ICD-10-CM | POA: Diagnosis not present

## 2022-09-07 DIAGNOSIS — R748 Abnormal levels of other serum enzymes: Secondary | ICD-10-CM | POA: Insufficient documentation

## 2022-09-07 DIAGNOSIS — K8020 Calculus of gallbladder without cholecystitis without obstruction: Secondary | ICD-10-CM | POA: Diagnosis not present

## 2022-09-07 LAB — COMPREHENSIVE METABOLIC PANEL
ALT: 12 U/L (ref 0–44)
AST: 16 U/L (ref 15–41)
Albumin: 4 g/dL (ref 3.5–5.0)
Alkaline Phosphatase: 67 U/L (ref 38–126)
Anion gap: 10 (ref 5–15)
BUN: 12 mg/dL (ref 6–20)
CO2: 24 mmol/L (ref 22–32)
Calcium: 9.2 mg/dL (ref 8.9–10.3)
Chloride: 104 mmol/L (ref 98–111)
Creatinine, Ser: 0.99 mg/dL (ref 0.44–1.00)
GFR, Estimated: >60 mL/min (ref >60)
Glucose, Bld: 88 mg/dL (ref 70–99)
Potassium: 4.3 mmol/L (ref 3.5–5.1)
Sodium: 138 mmol/L (ref 135–145)
Total Bilirubin: 0.4 mg/dL (ref 0.3–1.2)
Total Protein: 7.5 g/dL (ref 6.5–8.1)

## 2022-09-07 LAB — CBC
HCT: 37 % (ref 36.0–46.0)
Hemoglobin: 11.7 g/dL — ABNORMAL LOW (ref 12.0–15.0)
MCH: 26.1 pg (ref 26.0–34.0)
MCHC: 31.6 g/dL (ref 30.0–36.0)
MCV: 82.4 fL (ref 80.0–100.0)
Platelets: 374 10*3/uL (ref 150–400)
RBC: 4.49 MIL/uL (ref 3.87–5.11)
RDW: 15.3 % (ref 11.5–15.5)
WBC: 9.9 10*3/uL (ref 4.0–10.5)
nRBC: 0 % (ref 0.0–0.2)

## 2022-09-07 LAB — URINALYSIS, ROUTINE W REFLEX MICROSCOPIC
Bilirubin Urine: NEGATIVE
Glucose, UA: NEGATIVE mg/dL
Hgb urine dipstick: NEGATIVE
Ketones, ur: NEGATIVE mg/dL
Leukocytes,Ua: NEGATIVE
Nitrite: NEGATIVE
Protein, ur: NEGATIVE mg/dL
Specific Gravity, Urine: 1.009 (ref 1.005–1.030)
pH: 5.5 (ref 5.0–8.0)

## 2022-09-07 LAB — LIPASE, BLOOD: Lipase: 127 U/L — ABNORMAL HIGH (ref 11–51)

## 2022-09-07 LAB — PREGNANCY, URINE: Preg Test, Ur: NEGATIVE

## 2022-09-07 MED ORDER — ALUM & MAG HYDROXIDE-SIMETH 200-200-20 MG/5ML PO SUSP
30.0000 mL | Freq: Once | ORAL | Status: AC
  Administered 2022-09-07: 30 mL via ORAL
  Filled 2022-09-07: qty 30

## 2022-09-07 MED ORDER — ACETAMINOPHEN 500 MG PO TABS
1000.0000 mg | ORAL_TABLET | Freq: Once | ORAL | Status: AC
  Administered 2022-09-07: 1000 mg via ORAL
  Filled 2022-09-07: qty 2

## 2022-09-07 MED ORDER — FAMOTIDINE 20 MG PO TABS
20.0000 mg | ORAL_TABLET | Freq: Once | ORAL | Status: AC
  Administered 2022-09-07: 20 mg via ORAL
  Filled 2022-09-07: qty 1

## 2022-09-07 NOTE — Discharge Instructions (Addendum)
It was our pleasure to provide your ER care today - we hope that you feel better.  Your ultrasound shows a possible gallstone (vs gallbladder polyp), as well as the liver lesions noted previously. Your labs show one of your pancreas tests (lipase 127) is mildly high.   Drink plenty of fluids/stay well hydrated.   Continue prevacid.  You may also try pepcid or maalox for reflux symptoms.   For above imaging findings, follow up with general surgeon and your primary care doctor in the next few weeks - call office to arrange appointment.   Return to ER if worse, new symptoms, fevers, new or worsening or severe pain, persistent vomiting, or other concern.

## 2022-09-07 NOTE — ED Provider Notes (Signed)
[facility] EMERGENCY DEPT Provider Note   CSN: [id number] Arrival date & time: 09/07/22  1619     History  Chief Complaint  Patient presents with   Abdominal Pain    Christine Nelson is a 31 y.o. female.  Pt c/o epigastric pain the in past few hours. Symptoms acute onset, at rest, dull, non radiating. No specific exacerbating or alleviating factors. Notes hx gerd.  Denies hx pud, gallstones or pancreatitis (prior u/s noted gb sludge).  Pt denies dysuria or gu c/o. No back or flank pain. No nvd. No chest pain or discomfort. No sob.   The history is provided by the patient and medical records.  Abdominal Pain Associated symptoms: no chest pain, no chills, no constipation, no cough, no diarrhea, no dysuria, no fever, no shortness of breath, no sore throat and no vomiting        Home Medications Prior to Admission medications   Medication Sig Start Date End Date Taking? Authorizing Provider  Vitamin D, Ergocalciferol, (DRISDOL) 1.25 MG (50000 UNIT) CAPS capsule Take 1 capsule (50,000 Units total) by mouth every 7 (seven) days. 08/17/22   Billie Ruddy, MD  albuterol (VENTOLIN HFA) 108 (90 Base) MCG/ACT inhaler Inhale 2 puffs into the lungs every 6 (six) hours as needed for wheezing or shortness of breath.    [provider]  calcium carbonate (TUMS EX) 750 MG chewable tablet Chew 750 mg by mouth See admin instructions. Take 750 mg at 2 pm and 750 mg at 4 pm    [provider]  cetirizine (ZYRTEC) 10 MG tablet TAKE 1 TABLET BY MOUTH EVERY DAY Patient taking differently: Take 10 mg by mouth daily. 03/04/22   Billie Ruddy, MD  hydrocortisone 2.5 % ointment Apply topically 2 (two) times daily. 01/01/22   Billie Ruddy, MD  JUNEL FE 1/20 1-20 MG-MCG tablet Take 1 tablet by mouth daily. 08/07/22   [provider]  lansoprazole (PREVACID) 30 MG capsule Take 30 mg by mouth daily. 04/03/22   [provider]  levothyroxine  (SYNTHROID) 125 MCG tablet Take 125 mcg by mouth daily before breakfast. 01/08/20   [provider]  Multiple Vitamins-Minerals (BARIATRIC MULTIVITAMINS/IRON PO) Take 1 tablet by mouth daily at 12 noon.    [provider]  Olopatadine-Mometasone (Rennie Plowman) 3127991536 MCG/ACT SUSP Place 1 spray into the nose daily as needed. 07/16/22   Valentina Shaggy, MD  ondansetron (ZOFRAN) 4 MG tablet Take 1 tablet (4 mg total) by mouth daily as needed for nausea or vomiting. Patient not taking: Reported on 07/22/2022 04/15/22 04/15/23  Governor Specking, MD  oxyCODONE-acetaminophen (PERCOCET) 7.5-325 MG tablet Take 1 tablet by mouth every 4 (four) hours as needed. Patient not taking: Reported on 07/22/2022 04/15/22   Governor Specking, MD  pantoprazole (PROTONIX) 20 MG tablet Take 1 tablet (20 mg total) by mouth daily. 04/03/22   Billie Ruddy, MD  sodium fluoride (PREVIDENT 5000 PLUS) 1.1 % CREA dental cream Place 1 Application onto teeth at bedtime.    [provider]  traMADol (ULTRAM) 50 MG tablet Take 50 mg by mouth daily as needed for moderate pain. Patient not taking: Reported on 07/22/2022 11/03/21   [provider]      Allergies    Patient has no known allergies.    Review of Systems   Review of Systems  Constitutional:  Negative for chills and fever.  HENT:  Negative for sore throat.   Eyes:  Negative for redness.  Respiratory:  Negative for cough and shortness of breath.   Cardiovascular:  Negative for chest pain and leg swelling.  Gastrointestinal:  Positive for abdominal pain. Negative for abdominal distention, constipation, diarrhea and vomiting.  Genitourinary:  Negative for dysuria, flank pain and pelvic pain.  Musculoskeletal:  Negative for back pain and neck pain.  Skin:  Negative for rash.  Neurological:  Negative for headaches.  Hematological:  Does not bruise/bleed easily.  Psychiatric/Behavioral:  Negative for confusion.     Physical  Exam Updated Vital Signs BP (!) 155/82 (BP Location: Right Arm)   Pulse 85   Temp 98.3 F (36.8 C)   Resp 18   Ht 1.626 m ('5\' 4"'$ )   Wt 134 kg   LMP 06/04/2022   SpO2 100%   BMI 50.71 kg/m  Physical Exam Vitals and nursing note reviewed.  Constitutional:      Appearance: Normal appearance. She is well-developed.  HENT:     Head: Atraumatic.     Nose: Nose normal.     Mouth/Throat:     Mouth: Mucous membranes are moist.  Eyes:     General: No scleral icterus.    Conjunctiva/sclera: Conjunctivae normal.  Neck:     Trachea: No tracheal deviation.  Cardiovascular:     Rate and Rhythm: Normal rate and regular rhythm.     Pulses: Normal pulses.     Heart sounds: Normal heart sounds. No murmur heard.    No friction rub. No gallop.  Pulmonary:     Effort: Pulmonary effort is normal. No respiratory distress.     Breath sounds: Normal breath sounds.  Abdominal:     General: Bowel sounds are normal. There is no distension.     Palpations: Abdomen is soft. There is no mass.     Tenderness: There is no abdominal tenderness. There is no guarding.  Genitourinary:    Comments: No cva tenderness.  Musculoskeletal:        General: No swelling.     Cervical back: Normal range of motion and neck supple. No rigidity. No muscular tenderness.     Right lower leg: No edema.     Left lower leg: No edema.  Skin:    General: Skin is warm and dry.     Findings: No rash.  Neurological:     Mental Status: She is alert.     Comments: Alert, speech normal.   Psychiatric:        Mood and Affect: Mood normal.     ED Results / Procedures / Treatments   Labs (all labs ordered are listed, but only abnormal results are displayed) Results for orders placed or performed during the hospital encounter of 09/07/22  Lipase, blood  Result Value Ref Range   Lipase 127 (H) 11 - 51 U/L  Comprehensive metabolic panel  Result Value Ref Range   Sodium 138 135 - 145 mmol/L   Potassium 4.3 3.5 - 5.1  mmol/L   Chloride 104 98 - 111 mmol/L   CO2 24 22 - 32 mmol/L   Glucose, Bld 88 70 - 99 mg/dL   BUN 12 6 - 20 mg/dL   Creatinine, Ser 0.99 0.44 - 1.00 mg/dL   Calcium 9.2 8.9 - 10.3 mg/dL   Total Protein 7.5 6.5 - 8.1 g/dL   Albumin 4.0 3.5 - 5.0 g/dL   AST 16 15 - 41 U/L   ALT 12 0 - 44 U/L   Alkaline Phosphatase 67 38 - 126 U/L  Total Bilirubin 0.4 0.3 - 1.2 mg/dL   GFR, Estimated >60 >60 mL/min   Anion gap 10 5 - 15  CBC  Result Value Ref Range   WBC 9.9 4.0 - 10.5 K/uL   RBC 4.49 3.87 - 5.11 MIL/uL   Hemoglobin 11.7 (L) 12.0 - 15.0 g/dL   HCT 37.0 36.0 - 46.0 %   MCV 82.4 80.0 - 100.0 fL   MCH 26.1 26.0 - 34.0 pg   MCHC 31.6 30.0 - 36.0 g/dL   RDW 15.3 11.5 - 15.5 %   Platelets 374 150 - 400 K/uL   nRBC 0.0 0.0 - 0.2 %  Urinalysis, Routine w reflex microscopic  Result Value Ref Range   Color, Urine COLORLESS (A) YELLOW   APPearance CLEAR CLEAR   Specific Gravity, Urine 1.009 1.005 - 1.030   pH 5.5 5.0 - 8.0   Glucose, UA NEGATIVE NEGATIVE mg/dL   Hgb urine dipstick NEGATIVE NEGATIVE   Bilirubin Urine NEGATIVE NEGATIVE   Ketones, ur NEGATIVE NEGATIVE mg/dL   Protein, ur NEGATIVE NEGATIVE mg/dL   Nitrite NEGATIVE NEGATIVE   Leukocytes,Ua NEGATIVE NEGATIVE  Pregnancy, urine  Result Value Ref Range   Preg Test, Ur NEGATIVE NEGATIVE     EKG EKG Interpretation  Date/Time:  Monday September 07 2022 16:38:53 EST Ventricular Rate:  84 PR Interval:  120 QRS Duration: 74 QT Interval:  384 QTC Calculation: 453 R Axis:   28 Text Interpretation: Sinus rhythm with marked sinus arrhythmia Non-specific ST-t changes Confirmed by Lajean Saver [phone]) on 09/07/2022 4:49:42 PM  Radiology No results found.  Procedures Procedures    Medications Ordered in ED Medications - No data to display  ED Course/ Medical Decision Making/ A&P                           Medical Decision Making Problems Addressed: Elevated lipase: acute illness or injury Gallstones: acute  illness or injury with systemic symptoms that poses a threat to life or bodily functions Liver lesion: chronic illness or injury  Amount and/or Complexity of Data Reviewed External Data Reviewed: radiology and notes. Labs: ordered. Decision-making details documented in ED Course. Radiology: ordered and independent interpretation performed. Decision-making details documented in ED Course.  Risk OTC drugs. Decision regarding hospitalization.   Iv ns. Continuous pulse ox and cardiac monitoring. Labs ordered/sent. Imaging ordered.   Diff dx includes gallstones, gastritis, vital ge, etc - dispo decision including potential need for admission considered - will get labs and imaging and reassess.   Reviewed nursing notes and prior charts for additional history. External reports reviewed.   Acetaminophen po. Maalox po. Pepcid po.   Cardiac monitor: sinus rhythm, rate 85.  Labs reviewed/interpreted by me - preg neg. Ua neg. Wbc normal.   U/s reviewed/interpreted by me - ?gallstone vs polyp. Discussed w pt.   Pt to f/u pcp and gen surg as relates recent pain, u/s findings, and prior finding of liver lesions.    Recheck, no nv, abd soft nt. Vitals normal.    Pt appears stable for d/c.   Return precautions provided.            Final Clinical Impression(s) / ED Diagnoses Final diagnoses:  None    Rx / DC Orders ED Discharge Orders     None         Lajean Saver, MD 09/07/22 2042

## 2022-09-07 NOTE — ED Notes (Signed)
Reviewed AVS/discharge instruction with patient. Time allotted for and all questions answered. Patient is agreeable for d/c and escorted to ed exit by staff.  

## 2022-09-07 NOTE — ED Triage Notes (Signed)
Patient here POV from Home.  Endorses Epigastric Pain that began approximately 1 Hour ago. No SOB. No N/V/D. No Fevers.   History of GERD and takes Medication for Same. Has noted needing an increase in taking medication.   NAD Noted during Triage. A&Ox4. GCS 15. Ambulatory.

## 2022-09-07 NOTE — ED Notes (Signed)
Patient transported to Ultrasound 

## 2022-09-08 DIAGNOSIS — F329 Major depressive disorder, single episode, unspecified: Secondary | ICD-10-CM | POA: Diagnosis not present

## 2022-09-10 ENCOUNTER — Telehealth (INDEPENDENT_AMBULATORY_CARE_PROVIDER_SITE_OTHER): Payer: BC Managed Care – PPO | Admitting: Family Medicine

## 2022-09-10 ENCOUNTER — Encounter: Payer: Self-pay | Admitting: Family Medicine

## 2022-09-10 VITALS — Ht 63.0 in | Wt 295.0 lb

## 2022-09-10 DIAGNOSIS — R160 Hepatomegaly, not elsewhere classified: Secondary | ICD-10-CM | POA: Diagnosis not present

## 2022-09-10 DIAGNOSIS — Z903 Acquired absence of stomach [part of]: Secondary | ICD-10-CM

## 2022-09-10 DIAGNOSIS — K219 Gastro-esophageal reflux disease without esophagitis: Secondary | ICD-10-CM

## 2022-09-10 DIAGNOSIS — R748 Abnormal levels of other serum enzymes: Secondary | ICD-10-CM | POA: Diagnosis not present

## 2022-09-10 DIAGNOSIS — K829 Disease of gallbladder, unspecified: Secondary | ICD-10-CM | POA: Diagnosis not present

## 2022-09-10 DIAGNOSIS — R1013 Epigastric pain: Secondary | ICD-10-CM

## 2022-09-10 MED ORDER — LANSOPRAZOLE 30 MG PO CPDR: 30.0000 mg | DELAYED_RELEASE_CAPSULE | Freq: Every day | ORAL | 1 refills | Status: DC

## 2022-09-10 NOTE — Progress Notes (Signed)
Virtual Visit via Video Note  I connected with Christine Nelson on 09/10/22 at 10:30 AM EST by a video enabled telemedicine application 2/2 [id number] pandemic and verified that I am speaking with the correct person using two identifiers.  Location patient: home Location provider:work or home office Persons participating in the virtual visit: patient, provider  I discussed the limitations of evaluation and management by telemedicine and the availability of in person appointments. The patient expressed understanding and agreed to proceed. Chief Complaint  Patient presents with   Follow-up    Pt reports she is following up from ED visit on Monday for [place] abdominal pain and Right shoulder pain. She reports they had told there is possible gallstone or polyp and to follow up with PCP. She added she  is feeling better today. No abdominal pain.     HPI: Pt is a 31 yo female with pmh sig for h/o obesity s/p gastric sleeve, liver masses, GERD, asthma, seasonal allergies, hypothyroidism, PCOS who was seen for f/u.    Seen in ED for Abd pain radiating into R shoulder.  Taking protonix, but at times has to take BID.  Took for adb pain, but it continued.  U/s in ED with small gallstone vs polyp in gallbladder.      Pt called [facility], has appt with Dr. Zenia Resides next Tuesday.     ROS: See pertinent positives and negatives per HPI.  Past Medical History:  Diagnosis Date   Asthma    with allergy season   Fibroids    GERD (gastroesophageal reflux disease)    History of prediabetes    Hypothyroidism    Liver masses    followed by Michaelle Birks MD .   Obesity    PCOS (polycystic ovarian syndrome)    Uterine leiomyoma    Vitamin D deficiency     Past Surgical History:  Procedure Laterality Date   BIOPSY  05/09/2021   Procedure: BIOPSY;  Surgeon: Felicie Morn, MD;  Location: [facility];  Service: General;;   ESOPHAGOGASTRODUODENOSCOPY N/A 05/09/2021   Procedure:  ESOPHAGOGASTRODUODENOSCOPY (EGD) with biopsies;  Surgeon: Felicie Morn, MD;  Location: [facility];  Service: General;  Laterality: N/A;   LAPAROSCOPIC GASTRIC SLEEVE RESECTION  07/15/2021   LAPAROSCOPIC GELPORT ASSISTED MYOMECTOMY N/A 04/15/2022   Procedure: LAPAROSCOPIC GELPORT ASSISTED MYOMECTOMY;  Surgeon: Governor Specking, MD;  Location: [facility];  Service: Gynecology;  Laterality: N/A;   UPPER GI ENDOSCOPY N/A 07/15/2021   Procedure: UPPER GI ENDOSCOPY;  Surgeon: Felicie Morn, MD;  Location: [facility];  Service: General;  Laterality: N/A;   WISDOM TOOTH EXTRACTION  2011    Family History  Problem Relation Age of Onset   Hypertension Mother    Thyroid disease Father    Hypertension Maternal Grandfather    Stroke Maternal Grandfather    Colon cancer Neg Hx    Esophageal cancer Neg Hx    Rectal cancer Neg Hx     Current Outpatient Medications:    albuterol (VENTOLIN HFA) 108 (90 Base) MCG/ACT inhaler, Inhale 2 puffs into the lungs every 6 (six) hours as needed for wheezing or shortness of breath., Disp: , Rfl:    calcium carbonate (TUMS EX) 750 MG chewable tablet, Chew 750 mg by mouth See admin instructions. Take 750 mg at 2 pm and 750 mg at 4 pm, Disp: , Rfl:    cetirizine (ZYRTEC) 10 MG tablet, TAKE 1 TABLET BY MOUTH EVERY DAY (Patient taking differently: Take 10 mg  by mouth daily.), Disp: 90 tablet, Rfl: 1   hydrocortisone 2.5 % ointment, Apply topically 2 (two) times daily., Disp: 30 g, Rfl: 0   JUNEL FE 1/20 1-20 MG-MCG tablet, Take 1 tablet by mouth daily., Disp: , Rfl:    lansoprazole (PREVACID) 30 MG capsule, Take 30 mg by mouth daily., Disp: , Rfl:    levothyroxine (SYNTHROID) 125 MCG tablet, Take 125 mcg by mouth daily before breakfast., Disp: , Rfl:    Multiple Vitamins-Minerals (BARIATRIC MULTIVITAMINS/IRON PO), Take 1 tablet by mouth daily at 12 noon., Disp: , Rfl:    Olopatadine-Mometasone (RYALTRIS) 665-25 MCG/ACT SUSP, Place 1 spray into the nose daily as  needed., Disp: 29 g, Rfl: 5   sodium fluoride (PREVIDENT 5000 PLUS) 1.1 % CREA dental cream, Place 1 Application onto teeth at bedtime., Disp: , Rfl:    Vitamin D, Ergocalciferol, (DRISDOL) 1.25 MG (50000 UNIT) CAPS capsule, Take 1 capsule (50,000 Units total) by mouth every 7 (seven) days., Disp: 12 capsule, Rfl: 0   pantoprazole (PROTONIX) 20 MG tablet, Take 1 tablet (20 mg total) by mouth daily. (Patient not taking: Reported on 09/10/2022), Disp: 90 tablet, Rfl: 1   traMADol (ULTRAM) 50 MG tablet, Take 50 mg by mouth daily as needed for moderate pain. (Patient not taking: Reported on 07/22/2022), Disp: , Rfl:   EXAM:  VITALS per patient if applicable: RR between 59-56 bpm  GENERAL: alert, oriented, appears well and in no acute distress  HEENT: atraumatic, conjunctiva clear, no obvious abnormalities on inspection of external nose and ears  NECK: normal movements of the head and neck  LUNGS: on inspection no signs of respiratory distress, breathing rate appears normal, no obvious gross SOB, gasping or wheezing  CV: no obvious cyanosis  MS: moves all visible extremities without noticeable abnormality  PSYCH/NEURO: pleasant and cooperative, no obvious depression or anxiety, speech and thought processing grossly intact  ASSESSMENT AND PLAN:  Discussed the following assessment and plan:  Elevated lipase  Gallbladder disease  S/P gastric sleeve procedure  Liver masses  Epigastric pain  Gastroesophageal reflux disease, unspecified whether esophagitis present - Plan: lansoprazole (PREVACID) 30 MG capsule  Epigastric pain currently resolved.  Elevated lipase likely 2/2 gall stone.  Continue prevacid daily.  Bland diet.  Keep f/u with general surg for gallbladder.  Pt to set up f/u with GI.  F/u prn  I discussed the assessment and treatment plan with the patient. The patient was provided an opportunity to ask questions and all were answered. The patient agreed with the plan and  demonstrated an understanding of the instructions.   The patient was advised to call back or seek an in-person evaluation if the symptoms worsen or if the condition fails to improve as anticipated.  I provided 36 minutes of non-face-to-face time during this encounter.   Billie Ruddy, MD

## 2022-09-15 DIAGNOSIS — R1013 Epigastric pain: Secondary | ICD-10-CM | POA: Diagnosis not present

## 2022-09-15 DIAGNOSIS — Z903 Acquired absence of stomach [part of]: Secondary | ICD-10-CM | POA: Diagnosis not present

## 2022-09-15 DIAGNOSIS — F329 Major depressive disorder, single episode, unspecified: Secondary | ICD-10-CM | POA: Diagnosis not present

## 2022-09-15 DIAGNOSIS — D134 Benign neoplasm of liver: Secondary | ICD-10-CM | POA: Diagnosis not present

## 2022-09-22 DIAGNOSIS — F329 Major depressive disorder, single episode, unspecified: Secondary | ICD-10-CM | POA: Diagnosis not present

## 2022-10-02 ENCOUNTER — Encounter: Payer: Self-pay | Admitting: Surgery

## 2022-10-04 ENCOUNTER — Ambulatory Visit
Admission: RE | Admit: 2022-10-04 | Discharge: 2022-10-04 | Disposition: A | Discharge status: Home / Self Care | Payer: BC Managed Care – PPO | Source: Ambulatory Visit | Attending: Surgery | Admitting: Surgery

## 2022-10-04 MED ORDER — GADOXETATE DISODIUM 0.25 MMOL/ML IV SOLN
10.0000 mL | Freq: Once | INTRAVENOUS | Status: AC | PRN
  Administered 2022-10-04: 10 mL via INTRAVENOUS

## 2022-10-08 ENCOUNTER — Telehealth (INDEPENDENT_AMBULATORY_CARE_PROVIDER_SITE_OTHER): Payer: Self-pay | Admitting: Family Medicine

## 2022-10-08 VITALS — Ht 63.0 in | Wt 295.0 lb

## 2022-10-08 DIAGNOSIS — K760 Fatty (change of) liver, not elsewhere classified: Secondary | ICD-10-CM

## 2022-10-08 DIAGNOSIS — K8020 Calculus of gallbladder without cholecystitis without obstruction: Secondary | ICD-10-CM

## 2022-10-08 DIAGNOSIS — R160 Hepatomegaly, not elsewhere classified: Secondary | ICD-10-CM

## 2022-10-08 NOTE — Progress Notes (Signed)
Virtual Visit via Video Note  I connected with Christine Nelson on 10/08/22 at  8:30 AM EST by a video enabled telemedicine application 2/2 [id number] pandemic and verified that I am speaking with the correct person using two identifiers.  Location patient: home Location provider:work or home office Persons participating in the virtual visit: patient, provider  I discussed the limitations of evaluation and management by telemedicine and the availability of in person appointments. The patient expressed understanding and agreed to proceed.  Chief Complaint  Patient presents with   Abdominal Pain    Pt reports of epigastric pain. Started couple wks to a month ago. Take tylenol for the pain.    HPI: Pt is a 32 yo female with pmh sig for obesity, GERD, fibriods, hepatic cyst, hypothyroidism, s/p gastric surgery, PCOS who was seen for f/u.    Pt seen in ED on 09/07/22 for RUQ pain.  U/s with 11 mm echogenic focus within the gallbladder reflective of small stones or gallbladder polyp.   MRI Liver 10/04/22 with slight increase in size of lesions hepatic steatosis, and cholelithiasis acute cholecystitis.  Pt had appt with Gen surg.  Pt advised to consider surgical resection for liver lesions.  Referral to [facility] was made for next wk.  Pt advised to stop birth control as she restarted it.  Pt hesitant about having surgery.    ROS: See pertinent positives and negatives per HPI.  Past Medical History:  Diagnosis Date   Asthma    with allergy season   Fibroids    GERD (gastroesophageal reflux disease)    History of prediabetes    Hypothyroidism    Liver masses    followed by Michaelle Birks MD .   Obesity    PCOS (polycystic ovarian syndrome)    Uterine leiomyoma    Vitamin D deficiency     Past Surgical History:  Procedure Laterality Date   BIOPSY  05/09/2021   Procedure: BIOPSY;  Surgeon: Felicie Morn, MD;  Location: [facility];  Service: General;;   ESOPHAGOGASTRODUODENOSCOPY N/A  05/09/2021   Procedure: ESOPHAGOGASTRODUODENOSCOPY (EGD) with biopsies;  Surgeon: Felicie Morn, MD;  Location: [facility];  Service: General;  Laterality: N/A;   LAPAROSCOPIC GASTRIC SLEEVE RESECTION  07/15/2021   LAPAROSCOPIC GELPORT ASSISTED MYOMECTOMY N/A 04/15/2022   Procedure: LAPAROSCOPIC GELPORT ASSISTED MYOMECTOMY;  Surgeon: Governor Specking, MD;  Location: [facility];  Service: Gynecology;  Laterality: N/A;   UPPER GI ENDOSCOPY N/A 07/15/2021   Procedure: UPPER GI ENDOSCOPY;  Surgeon: Felicie Morn, MD;  Location: [facility];  Service: General;  Laterality: N/A;   WISDOM TOOTH EXTRACTION  2011    Family History  Problem Relation Age of Onset   Hypertension Mother    Thyroid disease Father    Hypertension Maternal Grandfather    Stroke Maternal Grandfather    Colon cancer Neg Hx    Esophageal cancer Neg Hx    Rectal cancer Neg Hx     Current Outpatient Medications:    albuterol (VENTOLIN HFA) 108 (90 Base) MCG/ACT inhaler, Inhale 2 puffs into the lungs every 6 (six) hours as needed for wheezing or shortness of breath., Disp: , Rfl:    calcium carbonate (TUMS EX) 750 MG chewable tablet, Chew 750 mg by mouth See admin instructions. Take 750 mg at 2 pm and 750 mg at 4 pm, Disp: , Rfl:    cetirizine (ZYRTEC) 10 MG tablet, TAKE 1 TABLET BY MOUTH EVERY DAY (Patient taking differently: Take 10 mg by  mouth daily.), Disp: 90 tablet, Rfl: 1   hydrocortisone 2.5 % ointment, Apply topically 2 (two) times daily., Disp: 30 g, Rfl: 0   JUNEL FE 1/20 1-20 MG-MCG tablet, Take 1 tablet by mouth daily., Disp: , Rfl:    lansoprazole (PREVACID) 30 MG capsule, Take 1 capsule (30 mg total) by mouth daily., Disp: 90 capsule, Rfl: 1   levothyroxine (SYNTHROID) 125 MCG tablet, Take 125 mcg by mouth daily before breakfast., Disp: , Rfl:    Multiple Vitamins-Minerals (BARIATRIC MULTIVITAMINS/IRON PO), Take 1 tablet by mouth daily at 12 noon., Disp: , Rfl:    Olopatadine-Mometasone (RYALTRIS) 665-25  MCG/ACT SUSP, Place 1 spray into the nose daily as needed., Disp: 29 g, Rfl: 5   sodium fluoride (PREVIDENT 5000 PLUS) 1.1 % CREA dental cream, Place 1 Application onto teeth at bedtime., Disp: , Rfl:    Vitamin D, Ergocalciferol, (DRISDOL) 1.25 MG (50000 UNIT) CAPS capsule, Take 1 capsule (50,000 Units total) by mouth every 7 (seven) days., Disp: 12 capsule, Rfl: 0  EXAM:  VITALS per patient if applicable: RR between 99-37 bpm  GENERAL: alert, oriented, appears well and in no acute distress  HEENT: atraumatic, conjunctiva clear, no obvious abnormalities on inspection of external nose and ears  NECK: normal movements of the head and neck  LUNGS: on inspection no signs of respiratory distress, breathing rate appears normal, no obvious gross SOB, gasping or wheezing  CV: no obvious cyanosis  MS: moves all visible extremities without noticeable abnormality  PSYCH/NEURO: pleasant and cooperative, no obvious depression or anxiety, speech and thought processing grossly intact  ASSESSMENT AND PLAN:  Discussed the following assessment and plan:  Liver masses -RUQ ultrasound 09/07/2022: Portion of the previously seen bilobed hepatic lesion is visualized on today's study.  This was shown on prior imaging to most likely reflect [place] or adenoma. -Follow-up MRI liver with without contrast 10/04/2022 with increased size of the subscapular hepatic lesions compatible with hepatic adenomas and measuring 9.1 x 8.3 cm and 8.3 x 5.2 cm respectively.  Suggest surgical consult given size/location of these lesions and associated risk of rupture.  Hepatic steatosis -Status post gastric bypass surgery -Follow-up MRI liver with without contrast 10/04/2022 with diffuse hepatic steatosis  Calculus of gallbladder without cholecystitis without obstruction -RUQ ultrasound 09/07/2022 with 11 mm echogenic focus within the gallbladder could reflect small stones or gallbladder wall polyp.  No evidence of acute  cholecystitis. -Follow-up MRI liver with without contrast 10/04/2022 with cholelithiasis without findings of acute cholecystitis.   Given increased size in hepatic lesions patient advised to keep upcoming appt with [facility] to discuss options. Resection recommended due risk of rupture.  Cholecystectomy for continued RUQ pain.  Continue follow-up with general surgery.  Lifestyle modifications including diet modifications for hepatic steatosis.    Given strict precautions.  Follow-up as needed.  I discussed the assessment and treatment plan with the patient. The patient was provided an opportunity to ask questions and all were answered. The patient agreed with the plan and demonstrated an understanding of the instructions.   The patient was advised to call back or seek an in-person evaluation if the symptoms worsen or if the condition fails to improve as anticipated.    Billie Ruddy, MD

## 2022-10-13 ENCOUNTER — Other Ambulatory Visit (HOSPITAL_COMMUNITY): Payer: Self-pay

## 2022-10-19 ENCOUNTER — Encounter: Payer: Self-pay | Admitting: Family Medicine

## 2022-12-06 IMAGING — CR DG CHEST 2V
2 series · 2 of 2 positions shown · non-contrast
Comparison: No prior.

CLINICAL DATA: Morbid obesity.

EXAM:
CHEST - 2 VIEW

[w chest pa]
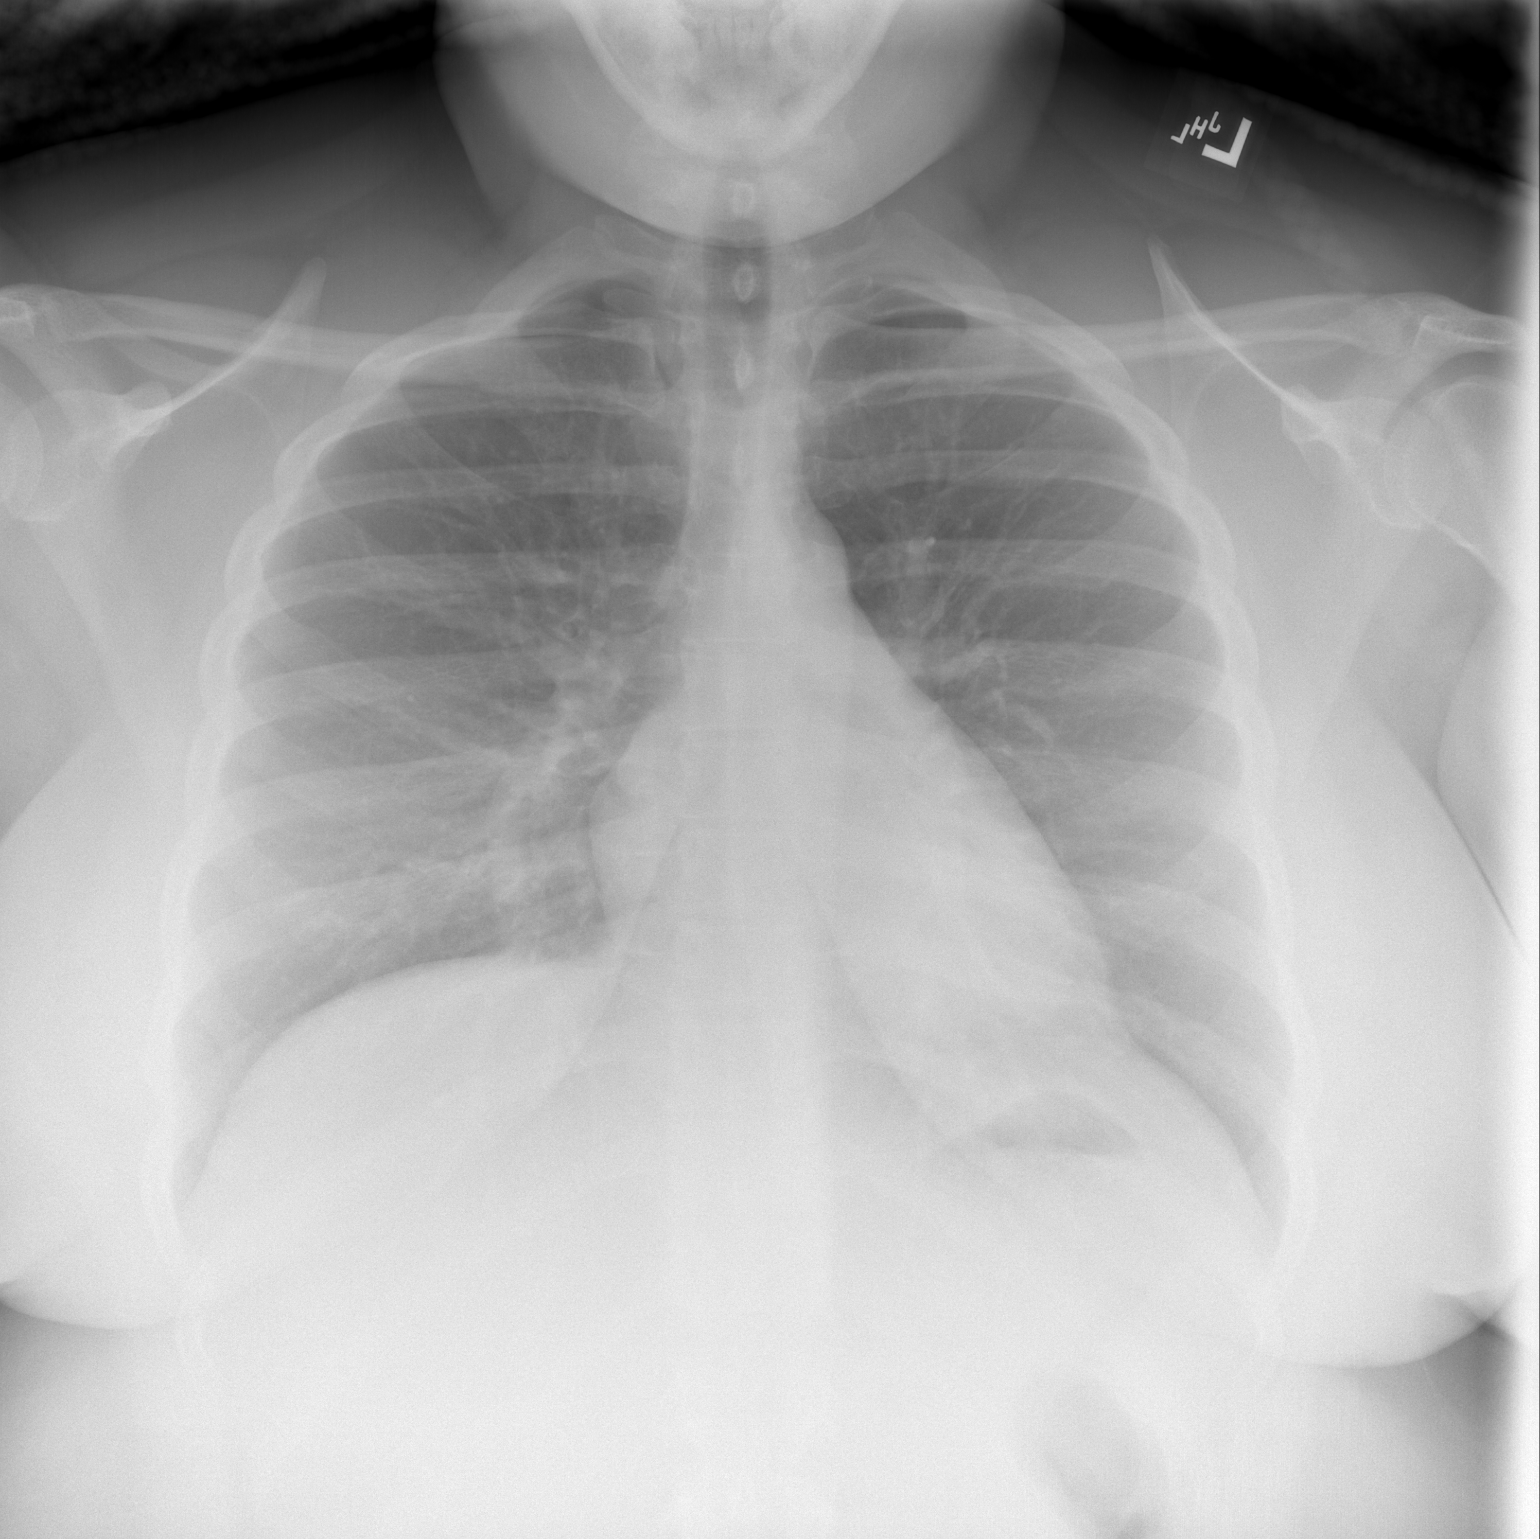

[w chest lat]
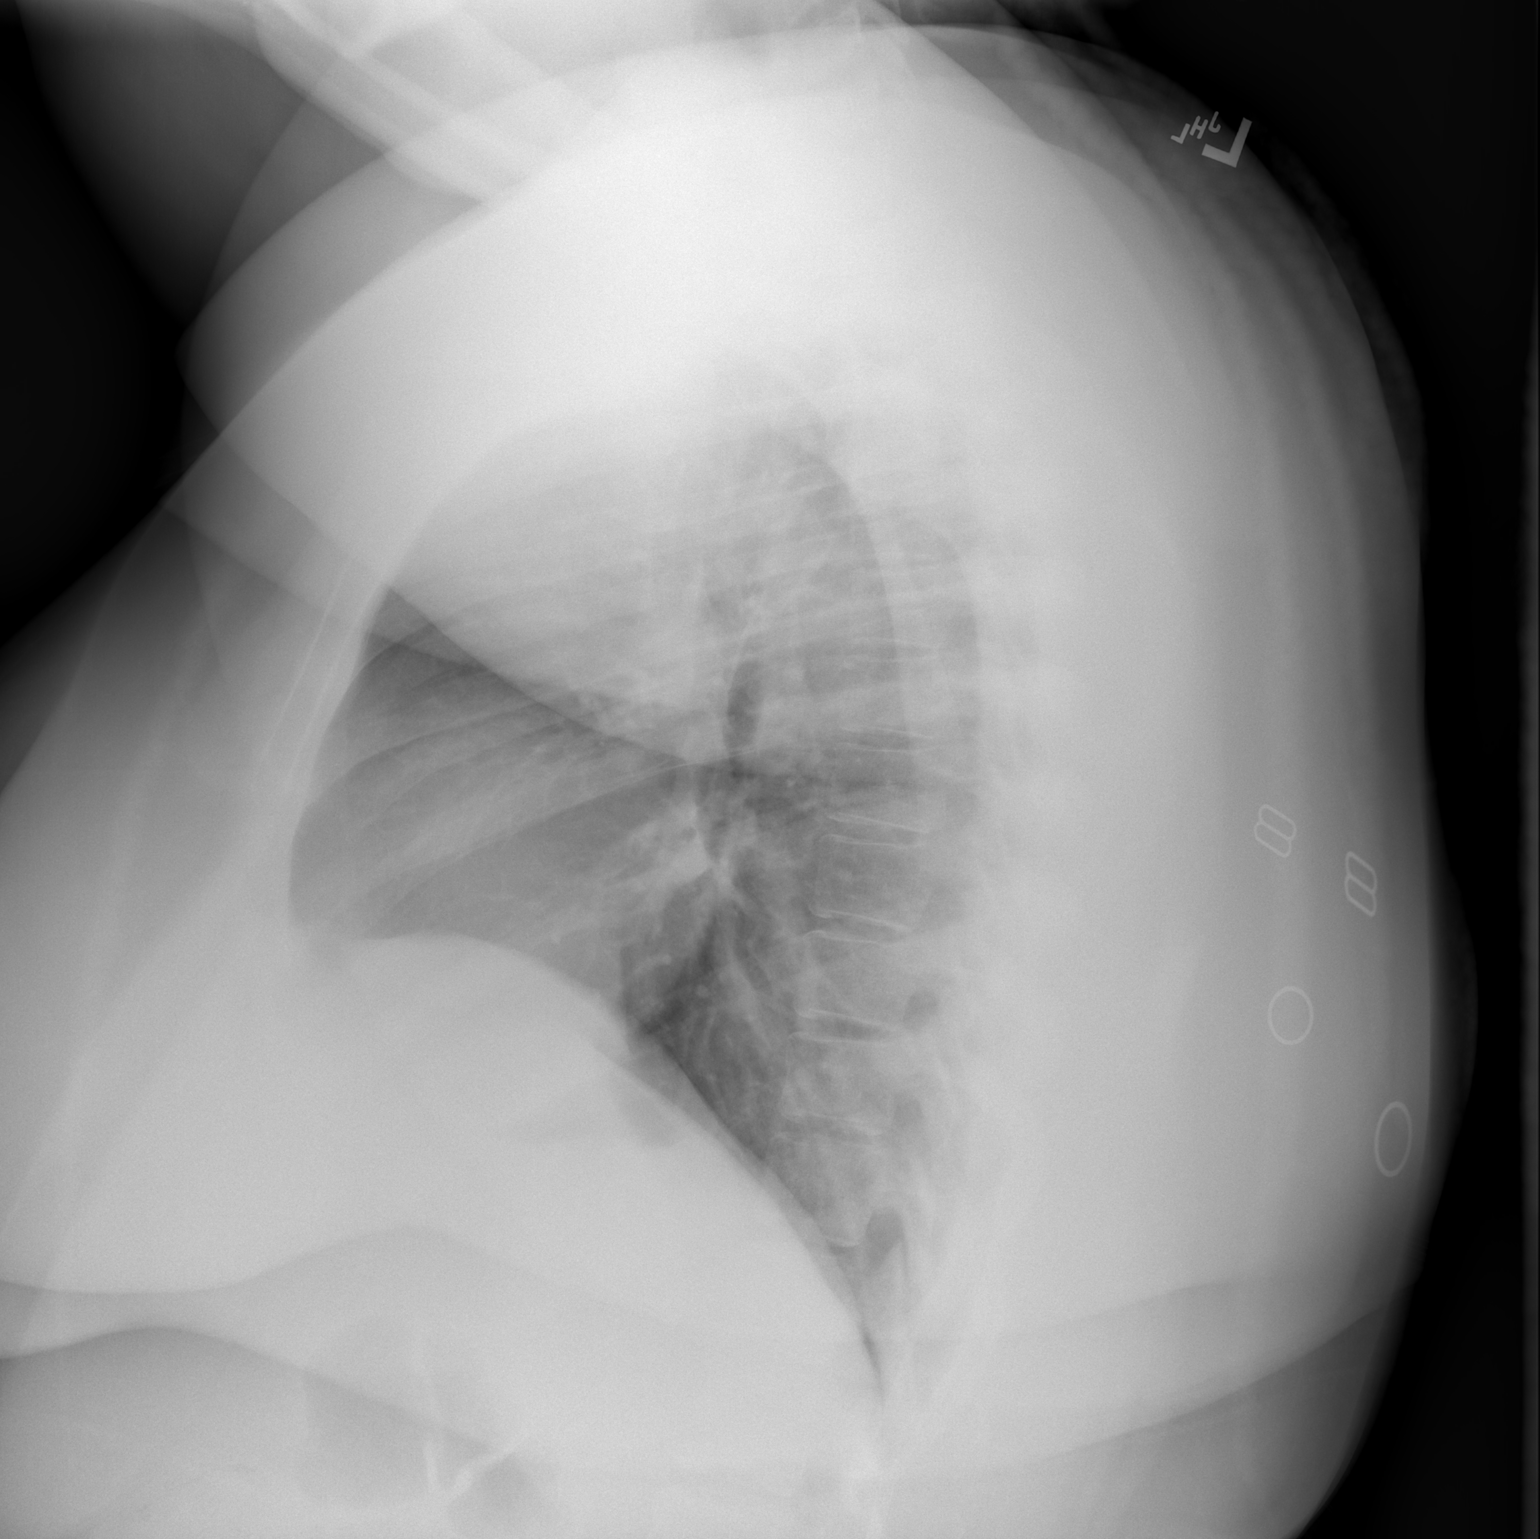

[2 of 2 positions shown; findings below may reference images not displayed]

FINDINGS: Mediastinum and hilar structures normal. Lungs are clear. No pleural
effusion or pneumothorax. Heart size normal. No acute bony
abnormality.
IMPRESSION: No acute cardiopulmonary disease.

## 2022-12-13 ENCOUNTER — Encounter: Payer: Self-pay | Admitting: Family Medicine

## 2022-12-31 ENCOUNTER — Encounter: Payer: Self-pay | Admitting: Family Medicine

## 2022-12-31 ENCOUNTER — Telehealth (INDEPENDENT_AMBULATORY_CARE_PROVIDER_SITE_OTHER): Payer: 59 | Admitting: Family Medicine

## 2022-12-31 VITALS — Ht 63.0 in | Wt 295.0 lb

## 2022-12-31 DIAGNOSIS — J4520 Mild intermittent asthma, uncomplicated: Secondary | ICD-10-CM | POA: Diagnosis not present

## 2022-12-31 DIAGNOSIS — Z903 Acquired absence of stomach [part of]: Secondary | ICD-10-CM | POA: Diagnosis not present

## 2022-12-31 DIAGNOSIS — J302 Other seasonal allergic rhinitis: Secondary | ICD-10-CM

## 2022-12-31 DIAGNOSIS — K219 Gastro-esophageal reflux disease without esophagitis: Secondary | ICD-10-CM | POA: Diagnosis not present

## 2022-12-31 DIAGNOSIS — D134 Benign neoplasm of liver: Secondary | ICD-10-CM

## 2022-12-31 MED ORDER — ALBUTEROL SULFATE HFA 108 (90 BASE) MCG/ACT IN AERS: 2.0000 | INHALATION_SPRAY | Freq: Four times a day (QID) | RESPIRATORY_TRACT | 5 refills | Status: DC | PRN

## 2022-12-31 NOTE — Progress Notes (Signed)
Virtual Visit via Video Note  I connected with Christine Nelson on 12/31/22 at 10:30 AM EDT by a video enabled telemedicine application and verified that I am speaking with the correct person using two identifiers.  Location patient: home Location provider:work or home office Persons participating in the virtual visit: patient, provider  I discussed the limitations of evaluation and management by telemedicine and the availability of in person appointments. The patient expressed understanding and agreed to proceed. Chief Complaint  Patient presents with   Breathing Problem    X2 weeks      HPI: Pt is a 32 yo female with pmh sig for asthma, allergies, obesity s/p gastric sleeve, hypothyroidism, hepatic adenoma, PCOS, Vit D def, fibroids who is seen for f/u.  Pt having issues with her breathing.  Not a tightness, but breathing feels restricted at times.  Coughing relieves the feeling.  Taking allergy med, cetirizine which helps but has been off for a few days.    No recent GERD symptoms.  On omeprazole but would like to wean off.    Pt notes wt gain.  Recently 307 lbs. Pt exercising.  Typically eats a protein and a vegetable.  Pt had procedure to remove liver lesions last wk.  Having mild discomfort which is improving each day.  Drinking more water.  ROS: See pertinent positives and negatives per HPI.  Past Medical History:  Diagnosis Date   Asthma    with allergy season   Fibroids    GERD (gastroesophageal reflux disease)    History of prediabetes    Hypothyroidism    Liver masses    followed by Michaelle Birks MD .   Obesity    PCOS (polycystic ovarian syndrome)    Uterine leiomyoma    Vitamin D deficiency     Past Surgical History:  Procedure Laterality Date   BIOPSY  05/09/2021   Procedure: BIOPSY;  Surgeon: Felicie Morn, MD;  Location: [facility];  Service: General;;   ESOPHAGOGASTRODUODENOSCOPY N/A 05/09/2021   Procedure: ESOPHAGOGASTRODUODENOSCOPY (EGD) with  biopsies;  Surgeon: Felicie Morn, MD;  Location: [facility];  Service: General;  Laterality: N/A;   LAPAROSCOPIC GASTRIC SLEEVE RESECTION  07/15/2021   LAPAROSCOPIC GELPORT ASSISTED MYOMECTOMY N/A 04/15/2022   Procedure: LAPAROSCOPIC GELPORT ASSISTED MYOMECTOMY;  Surgeon: Governor Specking, MD;  Location: [facility];  Service: Gynecology;  Laterality: N/A;   UPPER GI ENDOSCOPY N/A 07/15/2021   Procedure: UPPER GI ENDOSCOPY;  Surgeon: Felicie Morn, MD;  Location: [facility];  Service: General;  Laterality: N/A;   WISDOM TOOTH EXTRACTION  2011    Family History  Problem Relation Age of Onset   Hypertension Mother    Thyroid disease Father    Hypertension Maternal Grandfather    Stroke Maternal Grandfather    Colon cancer Neg Hx    Esophageal cancer Neg Hx    Rectal cancer Neg Hx      Current Outpatient Medications:    albuterol (VENTOLIN HFA) 108 (90 Base) MCG/ACT inhaler, Inhale 2 puffs into the lungs every 6 (six) hours as needed for wheezing or shortness of breath., Disp: , Rfl:    calcium carbonate (TUMS EX) 750 MG chewable tablet, Chew 750 mg by mouth See admin instructions. Take 750 mg at 2 pm and 750 mg at 4 pm, Disp: , Rfl:    cetirizine (ZYRTEC) 10 MG tablet, TAKE 1 TABLET BY MOUTH EVERY DAY (Patient taking differently: Take 10 mg by mouth daily.), Disp: 90 tablet, Rfl: 1  hydrocortisone 2.5 % ointment, Apply topically 2 (two) times daily., Disp: 30 g, Rfl: 0   JUNEL FE 1/20 1-20 MG-MCG tablet, Take 1 tablet by mouth daily., Disp: , Rfl:    lansoprazole (PREVACID) 30 MG capsule, Take 1 capsule (30 mg total) by mouth daily., Disp: 90 capsule, Rfl: 1   levothyroxine (SYNTHROID) 125 MCG tablet, Take 125 mcg by mouth daily before breakfast., Disp: , Rfl:    Multiple Vitamins-Minerals (BARIATRIC MULTIVITAMINS/IRON PO), Take 1 tablet by mouth daily at 12 noon., Disp: , Rfl:    Olopatadine-Mometasone (RYALTRIS) 665-25 MCG/ACT SUSP, Place 1 spray into the nose daily as needed.,  Disp: 29 g, Rfl: 5   sodium fluoride (PREVIDENT 5000 PLUS) 1.1 % CREA dental cream, Place 1 Application onto teeth at bedtime., Disp: , Rfl:    Vitamin D, Ergocalciferol, (DRISDOL) 1.25 MG (50000 UNIT) CAPS capsule, Take 1 capsule (50,000 Units total) by mouth every 7 (seven) days., Disp: 12 capsule, Rfl: 0  EXAM:  VITALS per patient if applicable:  RR between 12-20 bpm, wt 307 lbs  Body mass index is 52.26 kg/m.   GENERAL: alert, oriented, appears well and in no acute distress  HEENT: atraumatic, conjunctiva clear, no obvious abnormalities on inspection of external nose and ears  NECK: normal movements of the head and neck  LUNGS: on inspection no signs of respiratory distress, breathing rate appears normal, no obvious gross SOB, gasping or wheezing  CV: no obvious cyanosis  MS: moves all visible extremities without noticeable abnormality  PSYCH/NEURO: pleasant and cooperative, no obvious depression or anxiety, speech and thought processing grossly intact  ASSESSMENT AND PLAN:  Discussed the following assessment and plan:  Seasonal allergies -take Cetirizine daily -continue albuterol  Mild intermittent asthma, uncomplicated  - Plan: albuterol (VENTOLIN HFA) 108 (90 Base) MCG/ACT inhaler  Gastroesophageal reflux disease, unspecified whether esophagitis present -continue lansoprazole -avoid foods known to cause problems  - Plan: Ambulatory referral to diabetic education  S/P gastric sleeve procedure -continue BMVI -continue f/u with bariatric surgery -continue lifestyle modificaitons  - Plan: Ambulatory referral to diabetic education  Morbid obesity ([place])  -Body mass index is 52.26 kg/m. -s/p gastric sleeve -continue lifestyle modifications - Plan: Ambulatory referral to diabetic education  Hepatic adenoma -s/p hepatic arteriogram with bland embolization of bilateral hepatic adenomas 12/25/2022 with [facility] -Continue follow-up with IR and surgery  F/u prn  I  discussed the assessment and treatment plan with the patient. The patient was provided an opportunity to ask questions and all were answered. The patient agreed with the plan and demonstrated an understanding of the instructions.   The patient was advised to call back or seek an in-person evaluation if the symptoms worsen or if the condition fails to improve as anticipated.   Billie Ruddy, MD

## 2023-01-02 ENCOUNTER — Other Ambulatory Visit: Payer: Self-pay | Admitting: Family Medicine

## 2023-01-02 DIAGNOSIS — K219 Gastro-esophageal reflux disease without esophagitis: Secondary | ICD-10-CM

## 2023-01-02 DIAGNOSIS — L309 Dermatitis, unspecified: Secondary | ICD-10-CM

## 2023-01-11 ENCOUNTER — Encounter: Payer: Self-pay | Admitting: Family Medicine

## 2023-01-15 ENCOUNTER — Ambulatory Visit: Payer: 59 | Admitting: Family Medicine

## 2023-01-19 ENCOUNTER — Ambulatory Visit: Payer: 59 | Admitting: Allergy & Immunology

## 2023-01-19 ENCOUNTER — Other Ambulatory Visit: Payer: Self-pay

## 2023-01-19 ENCOUNTER — Encounter: Payer: Self-pay | Admitting: Allergy & Immunology

## 2023-01-19 VITALS — BP 100/76 | HR 86 | Temp 97.8°F | Resp 16

## 2023-01-19 DIAGNOSIS — J4520 Mild intermittent asthma, uncomplicated: Secondary | ICD-10-CM

## 2023-01-19 DIAGNOSIS — J302 Other seasonal allergic rhinitis: Secondary | ICD-10-CM

## 2023-01-19 DIAGNOSIS — J3089 Other allergic rhinitis: Secondary | ICD-10-CM | POA: Diagnosis not present

## 2023-01-19 DIAGNOSIS — L508 Other urticaria: Secondary | ICD-10-CM

## 2023-01-19 MED ORDER — FLUTICASONE FUROATE-VILANTEROL 100-25 MCG/ACT IN AEPB: 1.0000 | INHALATION_SPRAY | Freq: Every day | RESPIRATORY_TRACT | 5 refills | Status: AC

## 2023-01-19 MED ORDER — AIRSUPRA 90-80 MCG/ACT IN AERO: 2.0000 | INHALATION_SPRAY | RESPIRATORY_TRACT | 5 refills | Status: DC | PRN

## 2023-01-19 MED ORDER — MONTELUKAST SODIUM 10 MG PO TABS: 10.0000 mg | ORAL_TABLET | Freq: Every day | ORAL | 5 refills | Status: DC

## 2023-01-19 MED ORDER — RYALTRIS 665-25 MCG/ACT NA SUSP: 1.0000 | Freq: Every day | NASAL | 5 refills | Status: DC | PRN

## 2023-01-19 NOTE — Patient Instructions (Addendum)
1. Chronic urticaria - with elevated inflammatory markers - I am glad that you are doing well. - Send [place] labs from your liver doctor to make sure that your inflammatory markers are better.  - In the meantime, start suppressive dosing of antihistamines:   - Morning: Zyrtec (cetirizine)    - Evening: Zyrtec (cetirizine)  IF NEEDED - You can change this dosing at home, decreasing the dose as needed or increasing the dosing as needed.   2. Mild intermittent asthma, uncomplicated - Lung testing not done since you had a recent procedure.  - We are going to add on Singulair (montelukast) to help both your breathing and your allergy symptoms. - This can rarely cause irritability and bad dreams, so beware of that. - We are going to add on a daily controller medication: BREO (contains a long acting albuterol combined with an inhaled steroid). - This appears to be covered from our end, but call us if this is not the case.  - We are also changing you to a medication called AirSupra instead of albuterol (AirSupra contains an inhaled steroid AND albuterol to provide more protection when you need it).  - There is a copay card for this, so it should be $0.  - Daily controller medication(s): Breo - Prior to physical activity: AirSupra 10-15 minutes before physical activity. - Rescue medications: AirSupra 4 puffs every 4-6 hours as needed - Asthma control goals:  * Full participation in all desired activities (may need albuterol before activity) * Albuterol use two time or less a week on average (not counting use with activity) * Cough interfering with sleep two time or less a month * Oral steroids no more than once a year * No hospitalizations  3. Chronic rhinitis - Previous testing showed: grasses, weeds, trees, outdoor molds, and cat - Continue taking: Zyrtec (cetirizine)  tablet 1-2 times daily and Ryaltris 2 sprays per nostril up to twice daily - We are be more aggressive if needed.    4. Return in about 3 months (around 04/20/2023).    Please inform us of any Emergency Department visits, hospitalizations, or changes in symptoms. Call us before going to the ED for breathing or allergy symptoms since we might be able to fit you in for a sick visit. Feel free to contact us anytime with any questions, problems, or concerns.  It was a pleasure to see you again today!   Websites that have reliable patient information: 1. American Academy of Asthma, Allergy, and Immunology: www.aaaai.org 2. Food Allergy Research and Education (FARE): foodallergy.org 3. Mothers of Asthmatics: http://www.asthmacommunitynetwork.org 4. American College of Allergy, Asthma, and Immunology: www.acaai.org   COVID-19 Vaccine Information can be found at: [email or web address] For questions related to vaccine distribution or appointments, please email [email or web address] or call [phone].   We realize that you might be concerned about having an allergic reaction to the COVID19 vaccines. To help with that concern, WE ARE OFFERING THE COVID19 VACCINES IN OUR OFFICE! Ask the front desk for dates!     "Like" [place] on Facebook and Instagram for our latest updates!      A healthy democracy works best when [employer] participate! Make sure you are registered to vote! If you have moved or changed any of your contact information, you will need to get this updated before voting!  In some cases, you MAY be able to register to vote online: [email or web address]

## 2023-01-19 NOTE — Progress Notes (Signed)
FOLLOW UP  Date of Service/Encounter:  01/19/23   Assessment:   Chronic urticaria   Mild intermittent asthma, uncomplicated - increasing treatment to a combined ICS/LABA   Perennial and seasonal allergic rhinitis (grasses, weeds, trees, outdoor molds, and cat)   Eczema as child   S/p bariatric surgery October 2022   Uterine fibroids - s/p surgery in July 2023   Interested in going to medical school   Certified doula  Plan/Recommendations:   1. Chronic urticaria - with elevated inflammatory markers - I am glad that you are doing well. - Send [place] labs from your liver doctor to make sure that your inflammatory markers are better.  - In the meantime, start suppressive dosing of antihistamines:   - Morning: Zyrtec (cetirizine)    - Evening: Zyrtec (cetirizine)  IF NEEDED - You can change this dosing at home, decreasing the dose as needed or increasing the dosing as needed.   2. Mild intermittent asthma, uncomplicated - Lung testing not done since you had a recent procedure.  - We are going to add on Singulair (montelukast) to help both your breathing and your allergy symptoms. - This can rarely cause irritability and bad dreams, so beware of that. - We are going to add on a daily controller medication: BREO (contains a long acting albuterol combined with an inhaled steroid). - This appears to be covered from our end, but call us if this is not the case.  - We are also changing you to a medication called AirSupra instead of albuterol (AirSupra contains an inhaled steroid AND albuterol to provide more protection when you need it).  - There is a copay card for this, so it should be $0.  - Daily controller medication(s): Breo - Prior to physical activity: AirSupra 10-15 minutes before physical activity. - Rescue medications: AirSupra 4 puffs every 4-6 hours as needed - Asthma control goals:  * Full participation in all desired activities (may need albuterol before  activity) * Albuterol use two time or less a week on average (not counting use with activity) * Cough interfering with sleep two time or less a month * Oral steroids no more than once a year * No hospitalizations  3. Chronic rhinitis - Previous testing showed: grasses, weeds, trees, outdoor molds, and cat - Continue taking: Zyrtec (cetirizine)  tablet 1-2 times daily and Ryaltris 2 sprays per nostril up to twice daily - We are be more aggressive if needed.   4. Return in about 3 months (around 04/20/2023).    Subjective:   Christine Nelson is a 32 y.o. female presenting today for follow up of  Chief Complaint  Patient presents with   Follow-up   Wheezing   Allergies    Christine Nelson has a history of the following: Patient Active Problem List   Diagnosis Date Noted   Chronic urticaria 07/16/2022   Mild intermittent asthma, uncomplicated 07/16/2022   Seasonal and perennial allergic rhinitis 07/16/2022   Gastro-esophageal reflux disease without esophagitis 04/03/2022   Status post gastric surgery 09/15/2021   Morbid obesity with BMI of 50.0-59.9, adult 07/15/2021   Fibroid 08/24/2020   Iron deficiency anemia due to chronic blood loss 08/24/2020   Seasonal allergies 08/24/2020   Polycystic ovary syndrome 04/21/2019   History of hypothyroidism 03/15/2019   Class 3 severe obesity due to excess calories without serious comorbidity with body mass index (BMI) of 50.0 to 59.9 in adult 03/15/2019   Prediabetes 02/04/2018   Hypothyroidism 02/04/2018  Excessive bleeding in premenopausal period 02/04/2018   Insulin resistance 09/22/2012    History obtained from: chart review and patient.  Christine Nelson is a 32 y.o. female presenting for a follow up visit.  She was last seen in October 2023.  At that time, she was doing very well.  We continue with Zyrtec 10 mg 1-2 times a day.  For her asthma, her lung testing looked great.  We continue with albuterol as needed.  She had  previous testing that was positive to multiple indoor and outdoor allergens, but symptoms were under good control with Zyrtec.  We added Ryaltris to use as needed.  Since the last visit, she has done well.   Asthma/Respiratory Symptom History: She reports that the pollen is going to take her "out". She reports that her breathing is OK, but not great.  She is using the albuterol 1-2 times a day at this point in time. When the pollen first started coming in heavy, she had to use the inhaler. She has been having chest tightness with the pollen. She has never tried montelukast and she has never tried a controller medication. Her only inhaler is the albuterol. If she does not use it, she would not be able to function.   Allergic Rhinitis Symptom History: She is on the cetirizine once daily which she did during the winter time. She  is not having the rhinorrhea or itchy eyes. The cetirizine is covering those traditional allergic rhinitis symptoms. But they are doing nothing for the breathing problems. She would use the Ryaltris before she went to bed.  She has not been on antibiotics for any sinus infections.  She denies any ear infections.  Skin Symptom History: Her hives have been under good control with the Zyrtec at least once a day.  She has been doing it twice a day for a while now.  GERD Symptom History: GERD has been well controlled with the use  of the lansoprazole daily.  She had a procedure done on March 22nd to cut off the blood supply to the lesions in her liver. This did resolve with the procedure and she tolerated it well. They od not expect them to come back and it should shrink with cutting off the blood supply. They are seeing them again in May 2024.   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  She is pretty excited about this.  Otherwise, there have been no changes to her past medical history, surgical  history, family history, or social history.    Review of Systems  Constitutional: Negative.  Negative for chills, fever, malaise/fatigue and weight loss.  HENT:  Positive for congestion. Negative for ear discharge and ear pain.   Eyes:  Negative for pain, discharge and redness.  Respiratory:  Positive for cough and wheezing. Negative for sputum production and shortness of breath.   Cardiovascular: Negative.  Negative for chest pain and palpitations.  Gastrointestinal:  Negative for abdominal pain, blood in stool, constipation, diarrhea, heartburn, nausea and vomiting.  Skin:  Negative for itching and rash.  Neurological:  Negative for dizziness and headaches.  Endo/Heme/Allergies:  Positive for environmental allergies. Does not bruise/bleed easily.       Objective:   Blood pressure 100/76, pulse 86, temperature 97.8 F (36.6 C), temperature source Temporal, resp. rate 16, SpO2 100 %. There is no height or weight on file to calculate BMI.  Physical Exam Vitals reviewed.  Constitutional:      Appearance: She is well-developed and overweight.     Comments: Very talkative.   HENT:     Head: Normocephalic and atraumatic.     Right Ear: Tympanic membrane, ear canal and external ear normal. No drainage, swelling or tenderness. Tympanic membrane is not injected, scarred, erythematous, retracted or bulging.     Left Ear: Tympanic membrane, ear canal and external ear normal. No drainage, swelling or tenderness. Tympanic membrane is not injected, scarred, erythematous, retracted or bulging.     Nose: Rhinorrhea present. No nasal deformity, septal deviation or mucosal edema.     Right Turbinates: Enlarged, swollen and pale.     Left Turbinates: Enlarged, swollen and pale.     Right Sinus: No maxillary sinus tenderness or frontal sinus tenderness.     Left Sinus: No maxillary sinus tenderness or frontal sinus tenderness.     Comments: No nasal polyps.  Clear rhinorrhea.    Mouth/Throat:      Lips: Pink.     Mouth: Mucous membranes are moist. Mucous membranes are not pale and not dry.     Pharynx: Uvula midline.  Eyes:     General: Allergic shiner present.        Right eye: No discharge.        Left eye: No discharge.     Conjunctiva/sclera: Conjunctivae normal.     Right eye: Right conjunctiva is not injected. No chemosis.    Left eye: Left conjunctiva is not injected. No chemosis.    Pupils: Pupils are equal, round, and reactive to light.  Cardiovascular:     Rate and Rhythm: Normal rate and regular rhythm.     Heart sounds: Normal heart sounds.  Pulmonary:     Effort: Pulmonary effort is normal. No tachypnea, accessory muscle usage or respiratory distress.     Breath sounds: Normal breath sounds. No wheezing, rhonchi or rales.     Comments: Moving air well in all lung fields. Chest:     Chest wall: No tenderness.  Abdominal:     Tenderness: There is no abdominal tenderness. There is no guarding or rebound.  Lymphadenopathy:     Head:     Right side of head: No submandibular, tonsillar or occipital adenopathy.     Left side of head: No submandibular, tonsillar or occipital adenopathy.     Cervical: No cervical adenopathy.  Skin:    General: Skin is warm.     Capillary Refill: Capillary refill takes less than 2 seconds.     Coloration: Skin is not pale.     Findings: No abrasion, erythema, petechiae or rash. Rash is not papular, urticarial or vesicular.     Comments: Tattoo on the upper right back. Otherwise, no rashes including urticaria noted.  Neurological:     Mental Status: She is alert.  Psychiatric:        Behavior: Behavior is cooperative.      Diagnostic studies: none due to her recent procedure      Malachi Bonds, MD  [facility]

## 2023-02-07 ENCOUNTER — Other Ambulatory Visit: Payer: Self-pay | Admitting: Family Medicine

## 2023-02-07 DIAGNOSIS — J4520 Mild intermittent asthma, uncomplicated: Secondary | ICD-10-CM

## 2023-02-11 ENCOUNTER — Encounter (HOSPITAL_COMMUNITY): Payer: Self-pay | Admitting: *Deleted

## 2023-02-12 ENCOUNTER — Ambulatory Visit: Payer: 59 | Admitting: Internal Medicine

## 2023-02-12 NOTE — Progress Notes (Deleted)
Patient ID: Christine Nelson, female   DOB: November 19, 1990, 32 y.o.   MRN: [id number]   HPI  Christine Nelson is a 32 y.o. female, initially referred by her ObGyn , Dr. Victorino Dike A. Mock, returning for follow-up for hypothyroidism and type 2 diabetes, controlled, non-insulin-dependent, without long-term complications.  Last appointment 1.5 years ago.  Interim history: No increased urination, blurry vision, nausea, chest pain. Since last visit, she had surgery for her fibroids.  Hypothyroidism  - dx'ed in 2013 - She was on levothyroxine 150 mcg in the past but stopped in 2018 >> restarted in 2020 at a lower dose  She is on levothyroxine 125 mcg daily: - in am - fasting - at least 30 min from b'fast - + calcium - 6h later - no iron - + multivitamins- 4h later - stopped PPIs   - not on Biotin  Reviewed her TFTs: Lab Results  Component Value Date   TSH 2.75 08/14/2022   TSH 0.90 10/01/2021   TSH 11.64 (H) 05/01/2021   TSH 5.46 (H) 08/16/2020   TSH 3.03 04/11/2020   TSH 13.68 (H) 03/15/2019   TSH 6.960 (H) 02/04/2018   Lab Results  Component Value Date   FREET4 1.23 10/01/2021   FREET4 1.2 08/16/2020   FREET4 1.15 04/11/2020   FREET4 0.69 03/15/2019   FREET4 1.09 02/04/2018   Lab Results  Component Value Date   T3FREE 2.7 02/04/2018  05/03/2015: TSH 0.743, free T4 1.68  Pt denies: - feeling nodules in neck - hoarseness - dysphagia - choking  She has + FH of thyroid disorders in: Father. No FH of thyroid cancer. No h/o radiation tx to head or neck. No herbal supplements. No Biotin use. No recent steroids use.   Type 2 diabetes: -Previously on Metformin  Reviewed HbA1c level: Lab Results  Component Value Date   HGBA1C 5.3 10/01/2021   HGBA1C 6.2 (A) 07/02/2021   HGBA1C 7.0 (H) 05/01/2021   HGBA1C 6.2 (A) 11/21/2020   HGBA1C 6.5 04/11/2020   HGBA1C 6.8 (H) 03/15/2019   HGBA1C 5.5 02/04/2018   No CKD: Lab Results  Component Value Date   BUN 12  09/07/2022   BUN 9 08/14/2022   Lab Results  Component Value Date   CREATININE 0.99 09/07/2022   CREATININE 0.90 08/14/2022   No HL: Lab Results  Component Value Date   CHOL 150 08/14/2022   HDL 49.90 08/14/2022   LDLCALC 90 08/14/2022   TRIG 53.0 08/14/2022   CHOLHDL 3 08/14/2022   She had gastric sleeve surgery on 07/15/2021.  She lost ~50 lbs initially. She presented to the emergency room 09/12/2021 with abdominal pain and was found to have a high lipase, 118, and also increased AST/ALT (167/100) and high alkaline phosphatase. She was found to have 2 masses on the liver on RUQ U/S -reviewing the report: Heterogeneous hepatic echotexture, favored to be secondary to multiple solid masses. Considerations include metastatic disease or hepatocellular carcinoma/carcinomas.  She is still under investigation for these masses. She sees OB/GYN for PCOS, uterine fibroid (on Lupron initially, then had surgery).  Previously on OCPs but wanted to come off OCPs as she felt that they were hindering her weight loss. Sees Dr Vevelyn Royals.    ROS: + see HPI  I reviewed pt's medications, allergies, PMH, social hx, family hx, and changes were documented in the history of present illness. Otherwise, unchanged from my initial visit note.  Past Medical History:  Diagnosis Date   Asthma  with allergy season   Fibroids    GERD (gastroesophageal reflux disease)    History of prediabetes    Hypothyroidism    Liver masses    followed by Sophronia Simas MD .   Obesity    PCOS (polycystic ovarian syndrome)    Uterine leiomyoma    Vitamin D deficiency    Past Surgical History:  Procedure Laterality Date   BIOPSY  05/09/2021   Procedure: BIOPSY;  Surgeon: Quentin Ore, MD;  Location: [facility];  Service: General;;   ESOPHAGOGASTRODUODENOSCOPY N/A 05/09/2021   Procedure: ESOPHAGOGASTRODUODENOSCOPY (EGD) with biopsies;  Surgeon: Quentin Ore, MD;  Location: [facility];  Service: General;   Laterality: N/A;   LAPAROSCOPIC GASTRIC SLEEVE RESECTION  07/15/2021   LAPAROSCOPIC GELPORT ASSISTED MYOMECTOMY N/A 04/15/2022   Procedure: LAPAROSCOPIC GELPORT ASSISTED MYOMECTOMY;  Surgeon: Fermin Schwab, MD;  Location: [facility] OR;  Service: Gynecology;  Laterality: N/A;   UPPER GI ENDOSCOPY N/A 07/15/2021   Procedure: UPPER GI ENDOSCOPY;  Surgeon: Quentin Ore, MD;  Location: [facility];  Service: General;  Laterality: N/A;   WISDOM TOOTH EXTRACTION  2011   Social History   Socioeconomic History   Marital status: Divorced    Spouse name: Edison Pace   Number of children: 0   Years of education: Not on file   Highest education level: Master's degree (e.g., MA, MS, MEng, MEd, MSW, MBA)  Occupational History   Occupation: student   Occupation: Administrative Asst for [employer]  Tobacco Use   Smoking status: Never   Smokeless tobacco: Never  Vaping Use   Vaping Use: Never used  Substance and Sexual Activity   Alcohol use: Never   Drug use: Never   Sexual activity: Yes    Partners: Male    Birth control/protection: Injection  Other Topics Concern   Not on file  Social History Narrative   Pt is single.  She has a boyfriend.  Pt graduated college with a degree in Biology.  Pt has always wanted to become a physician.  Pt was teaching Biology.  She is now working part-time at [employer].         Social Determinants of Health   Financial Resource Strain: Medium Risk (12/31/2021)   Overall Financial Resource Strain (CARDIA)    Difficulty of Paying Living Expenses: Somewhat hard  Food Insecurity: No Food Insecurity (12/31/2021)   Hunger Vital Sign    Worried About Running Out of Food in the Last Year: Never true    Ran Out of Food in the Last Year: Never true  Transportation Needs: No Transportation Needs (12/31/2021)   PRAPARE - Administrator, Civil Service (Medical): No    Lack of Transportation (Non-Medical): No  Physical Activity: Sufficiently Active  (12/31/2021)   Exercise Vital Sign    Days of Exercise per Week: 3 days    Minutes of Exercise per Session: 60 min  Stress: No Stress Concern Present (12/31/2021)   [employer] of Occupational Health - Occupational Stress Questionnaire    Feeling of Stress : Only a little  Social Connections: Moderately Integrated (12/31/2021)   Social Connection and Isolation Panel [NHANES]    Frequency of Communication with Friends and Family: More than three times a week    Frequency of Social Gatherings with Friends and Family: Once a week    Attends Religious Services: More than 4 times per year    Active Member of [employer] or Organizations: No    Attends [employer]  or Organization Meetings: Not on file    Marital Status: Married  Catering manager Violence: Not on file   Current Outpatient Medications on File Prior to Visit  Medication Sig Dispense Refill   Albuterol-Budesonide (AIRSUPRA) 90-80 MCG/ACT AERO Inhale 2 puffs into the lungs every 4 (four) hours as needed. 10.7 g 5   calcium carbonate (TUMS EX) 750 MG chewable tablet Chew 750 mg by mouth See admin instructions. Take 750 mg at 2 pm and 750 mg at 4 pm     cetirizine (ZYRTEC) 10 MG tablet TAKE 1 TABLET BY MOUTH EVERY DAY 90 tablet 1   fluticasone furoate-vilanterol (BREO ELLIPTA) 100-25 MCG/ACT AEPB Inhale 1 puff into the lungs daily. 30 each 5   hydrocortisone 2.5 % ointment Apply topically 2 (two) times daily. 30 g 0   lansoprazole (PREVACID) 30 MG capsule TAKE 1 CAPSULE BY MOUTH EVERY DAY 90 capsule 1   levalbuterol (XOPENEX HFA) 45 MCG/ACT inhaler Inhale 1-2 puffs into the lungs every 6 (six) hours as needed for wheezing. 15 g 3   levothyroxine (SYNTHROID) 125 MCG tablet Take 125 mcg by mouth daily before breakfast.     montelukast (SINGULAIR) 10 MG tablet Take 1 tablet (10 mg total) by mouth at bedtime. 30 tablet 5   Multiple Vitamins-Minerals (BARIATRIC MULTIVITAMINS/IRON PO) Take 1 tablet by mouth daily at 12 noon.      Olopatadine-Mometasone (RYALTRIS) X543819 MCG/ACT SUSP Place 1 spray into the nose daily as needed. 29 g 5   sodium fluoride (PREVIDENT 5000 PLUS) 1.1 % CREA dental cream Place 1 Application onto teeth at bedtime.     No current facility-administered medications on file prior to visit.   No Known Allergies Family History  Problem Relation Age of Onset   Hypertension Mother    Thyroid disease Father    Hypertension Maternal Grandfather    Stroke Maternal Grandfather    Colon cancer Neg Hx    Esophageal cancer Neg Hx    Rectal cancer Neg Hx   Pertinent family history: See HPI  PE: There were no vitals taken for this visit. Wt Readings from Last 3 Encounters:  12/31/22 295 lb (133.8 kg)  10/08/22 295 lb (133.8 kg)  09/10/22 295 lb (133.8 kg)   Constitutional: overweight, in NAD Eyes:  EOMI, no exophthalmos ENT: no neck masses, no cervical lymphadenopathy Cardiovascular: RRR, No MRG Respiratory: CTA B Musculoskeletal: no deformities Skin:no rashes Neurological: no tremor with outstretched hands  ASSESSMENT: 1. Hypothyroidism  2.  Type 2 diabetes, non-insulin-dependent  PLAN:  1. Patient with  longstanding, uncontrolled, hypothyroidism, due to incomplete compliance with levothyroxine.  She was missing doses in the past and we discussed about taking it every morning and put it on the nightstand.  At last visit we also discussed about compensating for admission LT4 tablet by taking 2 tablets the next day -Her TPO and ATA antibodies are not elevated - latest thyroid labs reviewed with pt. >> normal: Lab Results  Component Value Date   TSH 2.75 08/14/2022  - she continues on LT4 125 mcg daily - pt feels good on this dose.  - we discussed about taking the thyroid hormone every day, with water, >30 minutes before breakfast, separated by >4 hours from acid reflux medications, calcium, iron, multivitamins. Pt. is taking it correctly. - will check thyroid tests today: TSH and fT4 -  If labs are abnormal, she will need to return for repeat TFTs in 1.5 months - I we will see her back  in 6 months  2.  Type 2 diabetes -She returns after a long absence of 1.5 years -At last visit, HbA1c was better, at 5.3%, decreased from 6.2% -Diabetes is in remission after her sleeve gastrectomy in 07/2021 -She initially lost more than 50 pounds, but since last visit, she gained 11 pounds -She continues to pay attention to the diet -She is not on any medications for her diabetes -Will recheck her HbA1c today  Carlus Pavlov, MD PhD [facility] Endocrinology

## 2023-03-02 ENCOUNTER — Other Ambulatory Visit (HOSPITAL_COMMUNITY): Payer: Self-pay

## 2023-03-09 ENCOUNTER — Other Ambulatory Visit: Payer: Self-pay

## 2023-03-18 ENCOUNTER — Telehealth: Payer: Self-pay

## 2023-03-18 ENCOUNTER — Other Ambulatory Visit: Payer: Self-pay

## 2023-03-18 MED ORDER — RYALTRIS 665-25 MCG/ACT NA SUSP: 1.0000 | Freq: Every day | NASAL | 5 refills | Status: DC | PRN

## 2023-03-18 NOTE — Telephone Encounter (Signed)
*  Asthma/Allergy  PA request received for Ryaltris [id number]/ACT suspension  PA submitted to [employer] via CMM and is pending additional questions/determination  Key: [id number]

## 2023-03-18 NOTE — Telephone Encounter (Signed)
Ryaltris was approved by [employer]

## 2023-04-02 ENCOUNTER — Encounter: Payer: Self-pay | Admitting: Internal Medicine

## 2023-04-02 ENCOUNTER — Other Ambulatory Visit: Payer: Self-pay | Admitting: Internal Medicine

## 2023-04-02 ENCOUNTER — Ambulatory Visit (INDEPENDENT_AMBULATORY_CARE_PROVIDER_SITE_OTHER): Payer: 59 | Admitting: Internal Medicine

## 2023-04-02 VITALS — BP 120/80 | HR 103 | Ht 63.0 in | Wt 321.4 lb

## 2023-04-02 DIAGNOSIS — E039 Hypothyroidism, unspecified: Secondary | ICD-10-CM | POA: Diagnosis not present

## 2023-04-02 DIAGNOSIS — E119 Type 2 diabetes mellitus without complications: Secondary | ICD-10-CM | POA: Diagnosis not present

## 2023-04-02 DIAGNOSIS — E1165 Type 2 diabetes mellitus with hyperglycemia: Secondary | ICD-10-CM

## 2023-04-02 LAB — TSH: TSH: 1.44 u[IU]/mL (ref 0.35–5.50)

## 2023-04-02 LAB — T4, FREE: Free T4: 0.98 ng/dL (ref 0.60–1.60)

## 2023-04-02 LAB — HEMOGLOBIN A1C: Hemoglobin A1C: 5.7

## 2023-04-02 MED ORDER — LEVOTHYROXINE SODIUM 125 MCG PO TABS: 125.0000 ug | ORAL_TABLET | Freq: Every day | ORAL | 3 refills | Status: DC

## 2023-04-02 NOTE — Progress Notes (Addendum)
Patient ID: Christine Nelson, female   DOB: Nov 26, 1990, 31 y.o.   MRN: [id number]   HPI  Christine Nelson is a 32 y.o. female, initially referred by her ObGyn , Dr. Victorino Dike A. Mock, returning for follow-up for hypothyroidism and type 2 diabetes, controlled, non-insulin-dependent, without long-term complications.  Last appointment 1.5 years ago.  Interim history: Since last visit, she had gastric sleeve surgery on 07/15/2021.  She lost 49 pounds in the 2 months after the surgery, before last visit, then ~30 more lbs. She gained ~35 pounds since then. She was prescribed Phentermine yesterday -did not start yet. No sodas, burgers. She is in a workout pgm. No nausea, abdominal pain, chest pain.  Hypothyroidism  - dx'ed in 2013 - She was on levothyroxine 150 mcg in the past but stopped in 2018 >> restarted in 2020 at a lower dose  She is on levothyroxine 125 mcg daily: - in am - fasting - at least 30 min from b'fast - + calcium - >4h later - no iron - + multivitamins- 4h later - + PPIs >4h later - not on Biotin  Reviewed her TFTs: Lab Results  Component Value Date   TSH 2.75 08/14/2022   TSH 0.90 10/01/2021   TSH 11.64 (H) 05/01/2021   TSH 5.46 (H) 08/16/2020   TSH 3.03 04/11/2020   TSH 13.68 (H) 03/15/2019   TSH 6.960 (H) 02/04/2018   Lab Results  Component Value Date   FREET4 1.23 10/01/2021   FREET4 1.2 08/16/2020   FREET4 1.15 04/11/2020   FREET4 0.69 03/15/2019   FREET4 1.09 02/04/2018   Lab Results  Component Value Date   T3FREE 2.7 02/04/2018  05/03/2015: TSH 0.743, free T4 1.68  Pt denies: - feeling nodules in neck - hoarseness - dysphagia - choking  She has + FH of thyroid disorders in: Father. No FH of thyroid cancer. No h/o radiation tx to head or neck. No herbal supplements. No Biotin use. No recent steroids use.   Type 2 diabetes: -Previously on Metformin  Reviewed HbA1c level: Lab Results  Component Value Date   HGBA1C 5.3 10/01/2021    HGBA1C 6.2 (A) 07/02/2021   HGBA1C 7.0 (H) 05/01/2021   HGBA1C 6.2 (A) 11/21/2020   HGBA1C 6.5 04/11/2020   HGBA1C 6.8 (H) 03/15/2019   HGBA1C 5.5 02/04/2018   No CKD: Lab Results  Component Value Date   BUN 12 09/07/2022   BUN 9 08/14/2022   Lab Results  Component Value Date   CREATININE 0.99 09/07/2022   CREATININE 0.90 08/14/2022   No HL: Lab Results  Component Value Date   CHOL 150 08/14/2022   HDL 49.90 08/14/2022   LDLCALC 90 08/14/2022   TRIG 53.0 08/14/2022   CHOLHDL 3 08/14/2022   She sees OB/GYN for PCOS, uterine fibroid (now on Lupron).  Previously on OCPs but wanted to come off OCPs as she felt that they are hindering her weight loss.  She presented to the emergency room 09/12/2021 with abdominal pain and was found to have a high lipase, 118, and also increased AST/ALT (167/100) and high alkaline phosphatase. She was found to have 2 masses on the liver on RUQ U/S -reviewing the report: Heterogeneous hepatic echotexture, favored to be secondary to multiple solid masses. Considerations include metastatic disease or hepatocellular carcinoma/carcinomas.  However, later investigation pointed towards hepatic adenomas. She had embolization. She was supposed to have fibroids surgery, but this was postponed - back on Lupron. The fibroid decreased in size from 11  to 8 cm.  She finally had her surgery 04/16/2022.  ROS: + see HPI  I reviewed pt's medications, allergies, PMH, social hx, family hx, and changes were documented in the history of present illness. Otherwise, unchanged from my initial visit note.  Past Medical History:  Diagnosis Date   Asthma    with allergy season   Fibroids    GERD (gastroesophageal reflux disease)    History of prediabetes    Hypothyroidism    Liver masses    followed by Sophronia Simas MD .   Obesity    PCOS (polycystic ovarian syndrome)    Uterine leiomyoma    Vitamin D deficiency    Past Surgical History:  Procedure Laterality Date    BIOPSY  05/09/2021   Procedure: BIOPSY;  Surgeon: Quentin Ore, MD;  Location: [facility];  Service: General;;   ESOPHAGOGASTRODUODENOSCOPY N/A 05/09/2021   Procedure: ESOPHAGOGASTRODUODENOSCOPY (EGD) with biopsies;  Surgeon: Quentin Ore, MD;  Location: [facility];  Service: General;  Laterality: N/A;   LAPAROSCOPIC GASTRIC SLEEVE RESECTION  07/15/2021   LAPAROSCOPIC GELPORT ASSISTED MYOMECTOMY N/A 04/15/2022   Procedure: LAPAROSCOPIC GELPORT ASSISTED MYOMECTOMY;  Surgeon: Fermin Schwab, MD;  Location: [facility] OR;  Service: Gynecology;  Laterality: N/A;   UPPER GI ENDOSCOPY N/A 07/15/2021   Procedure: UPPER GI ENDOSCOPY;  Surgeon: Quentin Ore, MD;  Location: [facility];  Service: General;  Laterality: N/A;   WISDOM TOOTH EXTRACTION  2011   Social History   Socioeconomic History   Marital status: Divorced    Spouse name: Edison Pace   Number of children: 0   Years of education: Not on file   Highest education level: Master's degree (e.g., MA, MS, MEng, MEd, MSW, MBA)  Occupational History   Occupation: student   Occupation: Administrative Asst for [employer]  Tobacco Use   Smoking status: Never   Smokeless tobacco: Never  Vaping Use   Vaping Use: Never used  Substance and Sexual Activity   Alcohol use: Never   Drug use: Never   Sexual activity: Yes    Partners: Male    Birth control/protection: Injection  Other Topics Concern   Not on file  Social History Narrative   Pt is single.  She has a boyfriend.  Pt graduated college with a degree in Biology.  Pt has always wanted to become a physician.  Pt was teaching Biology.  She is now working part-time at [employer].         Social Determinants of Health   Financial Resource Strain: Medium Risk (12/31/2021)   Overall Financial Resource Strain (CARDIA)    Difficulty of Paying Living Expenses: Somewhat hard  Food Insecurity: No Food Insecurity (12/31/2021)   Hunger Vital Sign    Worried About Running  Out of Food in the Last Year: Never true    Ran Out of Food in the Last Year: Never true  Transportation Needs: No Transportation Needs (12/31/2021)   PRAPARE - Administrator, Civil Service (Medical): No    Lack of Transportation (Non-Medical): No  Physical Activity: Sufficiently Active (12/31/2021)   Exercise Vital Sign    Days of Exercise per Week: 3 days    Minutes of Exercise per Session: 60 min  Stress: No Stress Concern Present (12/31/2021)   [employer] of Occupational Health - Occupational Stress Questionnaire    Feeling of Stress : Only a little  Social Connections: Moderately Integrated (12/31/2021)   Social Connection and Isolation Panel [NHANES]  Frequency of Communication with Friends and Family: More than three times a week    Frequency of Social Gatherings with Friends and Family: Once a week    Attends Religious Services: More than 4 times per year    Active Member of [employer] or Organizations: No    Attends Engineer, structural: Not on file    Marital Status: Married  Catering manager Violence: Not on file   Current Outpatient Medications on File Prior to Visit  Medication Sig Dispense Refill   Albuterol-Budesonide (AIRSUPRA) 90-80 MCG/ACT AERO Inhale 2 puffs into the lungs every 4 (four) hours as needed. 10.7 g 5   calcium carbonate (TUMS EX) 750 MG chewable tablet Chew 750 mg by mouth See admin instructions. Take 750 mg at 2 pm and 750 mg at 4 pm     cetirizine (ZYRTEC) 10 MG tablet TAKE 1 TABLET BY MOUTH EVERY DAY 90 tablet 1   hydrocortisone 2.5 % ointment Apply topically 2 (two) times daily. 30 g 0   lansoprazole (PREVACID) 30 MG capsule TAKE 1 CAPSULE BY MOUTH EVERY DAY 90 capsule 1   levalbuterol (XOPENEX HFA) 45 MCG/ACT inhaler Inhale 1-2 puffs into the lungs every 6 (six) hours as needed for wheezing. 15 g 3   levothyroxine (SYNTHROID) 125 MCG tablet Take 125 mcg by mouth daily before breakfast.     montelukast (SINGULAIR) 10 MG tablet  Take 1 tablet (10 mg total) by mouth at bedtime. 30 tablet 5   Multiple Vitamins-Minerals (BARIATRIC MULTIVITAMINS/IRON PO) Take 1 tablet by mouth daily at 12 noon.     Olopatadine-Mometasone (RYALTRIS) [id number] MCG/ACT SUSP Place 1 spray into the nose daily as needed. 29 g 5   sodium fluoride (PREVIDENT 5000 PLUS) 1.1 % CREA dental cream Place 1 Application onto teeth at bedtime.     No current facility-administered medications on file prior to visit.   No Known Allergies Family History  Problem Relation Age of Onset   Hypertension Mother    Thyroid disease Father    Hypertension Maternal Grandfather    Stroke Maternal Grandfather    Colon cancer Neg Hx    Esophageal cancer Neg Hx    Rectal cancer Neg Hx   Pertinent family history: See HPI  PE: BP 120/80   Pulse (!) 103   Ht 5\' 3"  (1.6 m)   Wt (!) 321 lb 6.4 oz (145.8 kg)   SpO2 99%   BMI 56.93 kg/m  Wt Readings from Last 3 Encounters:  04/02/23 (!) 321 lb 6.4 oz (145.8 kg)  12/31/22 295 lb (133.8 kg)  10/08/22 295 lb (133.8 kg)   Constitutional: overweight, in NAD Eyes:  EOMI, no exophthalmos ENT: no neck masses, no cervical lymphadenopathy Cardiovascular: tachycardia, RR, No MRG Respiratory: CTA B Musculoskeletal: no deformities Skin:no rashes Neurological: no tremor with outstretched hands  ASSESSMENT: 1. Hypothyroidism  2.  Type 2 diabetes, non-insulin-dependent  PLAN:  1. Patient with longstanding, uncontrolled, hypothyroidism, due to noncompliance with levothyroxine.  She was missing doses in the past and we discussed about taking it every morning and put it on the nightstand.  I also advised her to take 2 tablets the next day if missing 1 dose. -Her TPO and ATA antibodies were not elevated to point towards Hashimoto's thyroiditis. - latest thyroid labs reviewed with pt. >> normal: Lab Results  Component Value Date   TSH 2.75 08/14/2022  - she continues on LT4 125 mcg daily - pt feels good on this dose. -  we discussed about taking the thyroid hormone every day, with water, >30 minutes before breakfast, separated by >4 hours from acid reflux medications, calcium, iron, multivitamins. Pt. is taking it correctly. - will check thyroid tests today: TSH and fT4 and we discussed that we may need to increase the dose of levothyroxine due to the weight gain. - If labs are abnormal, she will need to return for repeat TFTs in 1.5 months - Otherwise, I will see her back in a year  2.  Type 2 diabetes -HbA1c at last visit was better, at 5.3%, decreased from 6.2% -Her diabetes appeared to have remitted after sleeve gastrectomy on 08/24/2021; before last visit she lost 50 pounds in the 2 months after the surgery -However, she gained more than 30 pounds since last visit we discussed that this may exacerbate her diabetes.  She was started on phentermine, which will help.  She asks my opinion about Wegovy, which would be ok for her from the thyroid point of view, however, she did have a high lipase in the past so she needs to be careful with GI symptoms after starting it. -She continues off diabetic medications -At today's visit HbA1c is 5.7%, higher, but still controlled  Component     Latest Ref Rng 04/02/2023  TSH     0.35 - 5.50 uIU/mL 1.44   T4,Free(Direct)     0.60 - 1.60 ng/dL 1.61   Normal TFTs.  Carlus Pavlov, MD PhD [facility] Endocrinology

## 2023-04-02 NOTE — Addendum Note (Signed)
Addended by: Carlus Pavlov on: 04/02/2023 12:15 PM   Modules accepted: Orders

## 2023-04-02 NOTE — Patient Instructions (Addendum)
Please stop at the lab. ° °Please continue Levothyroxine 125 mcg daily. ° °Take the thyroid hormone EVERY DAY, with water, at least 30 minutes before breakfast, separated by at least 4 hours from: °- acid reflux medications °- calcium °- iron °- multivitamins ° °Please come back for a follow-up appointment in 6 months. ° °

## 2023-04-20 ENCOUNTER — Ambulatory Visit: Payer: 59 | Admitting: Dietician

## 2023-04-27 ENCOUNTER — Encounter: Payer: Self-pay | Admitting: Allergy & Immunology

## 2023-04-27 ENCOUNTER — Other Ambulatory Visit: Payer: Self-pay

## 2023-04-27 ENCOUNTER — Telehealth (INDEPENDENT_AMBULATORY_CARE_PROVIDER_SITE_OTHER): Payer: 59 | Admitting: Allergy & Immunology

## 2023-04-27 DIAGNOSIS — J302 Other seasonal allergic rhinitis: Secondary | ICD-10-CM | POA: Diagnosis not present

## 2023-04-27 DIAGNOSIS — J3089 Other allergic rhinitis: Secondary | ICD-10-CM

## 2023-04-27 DIAGNOSIS — J310 Chronic rhinitis: Secondary | ICD-10-CM

## 2023-04-27 DIAGNOSIS — J4520 Mild intermittent asthma, uncomplicated: Secondary | ICD-10-CM

## 2023-04-27 DIAGNOSIS — Z9884 Bariatric surgery status: Secondary | ICD-10-CM

## 2023-04-27 DIAGNOSIS — L508 Other urticaria: Secondary | ICD-10-CM | POA: Diagnosis not present

## 2023-04-27 MED ORDER — RYALTRIS 665-25 MCG/ACT NA SUSP: 1.0000 | Freq: Every day | NASAL | 5 refills | Status: DC | PRN

## 2023-04-27 MED ORDER — MONTELUKAST SODIUM 10 MG PO TABS: 10.0000 mg | ORAL_TABLET | Freq: Every day | ORAL | 1 refills | Status: DC

## 2023-04-27 NOTE — Patient Instructions (Addendum)
1. Chronic urticaria - with elevated inflammatory markers - I am glad that you are doing well. - Send [place] labs from your liver doctor to make sure that your inflammatory markers are better.  - In the meantime, start suppressive dosing of antihistamines:   - Morning: Zyrtec (cetirizine) 10mg    - Evening: Zyrtec (cetirizine) 10mg  IF NEEDED - You can change this dosing at home, decreasing the dose as needed or increasing the dosing as needed.   2. Mild intermittent asthma, uncomplicated - Lung testing not done since this was a video visit.  - Continue with the seasonal use of the Breo.  - This can rarely cause irritability and bad dreams, so beware of that. - We are going to add on a daily controller medication: BREO (contains a long acting albuterol combined with an inhaled steroid). - This appears to be covered from our end, but call us if this is not the case.  - We are also changing you to a medication called AirSupra instead of albuterol (AirSupra contains an inhaled steroid AND albuterol to provide more protection when you need it).  - There is a copay card for this, so it should be $0.  - Daily controller medication(s): Breo - Prior to physical activity: AirSupra 10-15 minutes before physical activity. - Rescue medications: AirSupra 4 puffs every 4-6 hours as needed - Asthma control goals:  * Full participation in all desired activities (may need albuterol before activity) * Albuterol use two time or less a week on average (not counting use with activity) * Cough interfering with sleep two time or less a month * Oral steroids no more than once a year * No hospitalizations  3. Chronic rhinitis - Previous testing showed: grasses, weeds, trees, outdoor molds, and cat - Continue taking: Zyrtec (cetirizine) 10mg  tablet 1-2 times daily and Ryaltris 2 sprays per nostril up to twice daily - We are be more aggressive if needed.   4. Return in about 6 months (around 10/28/2023).    Please  inform us of any Emergency Department visits, hospitalizations, or changes in symptoms. Call us before going to the ED for breathing or allergy symptoms since we might be able to fit you in for a sick visit. Feel free to contact us anytime with any questions, problems, or concerns.  It was a pleasure to see you again today!   Websites that have reliable patient information: 1. American Academy of Asthma, Allergy, and Immunology: www.aaaai.org 2. Food Allergy Research and Education (FARE): foodallergy.org 3. Mothers of Asthmatics: http://www.asthmacommunitynetwork.org 4. American College of Allergy, Asthma, and Immunology: www.acaai.org   COVID-19 Vaccine Information can be found at: [email or web address] For questions related to vaccine distribution or appointments, please email [email or web address] or call [phone].   We realize that you might be concerned about having an allergic reaction to the COVID19 vaccines. To help with that concern, WE ARE OFFERING THE COVID19 VACCINES IN OUR OFFICE! Ask the front desk for dates!     "Like" [place] on Facebook and Instagram for our latest updates!      A healthy democracy works best when [employer] participate! Make sure you are registered to vote! If you have moved or changed any of your contact information, you will need to get this updated before voting!  In some cases, you MAY be able to register to vote online: [email or web address]

## 2023-04-27 NOTE — Progress Notes (Signed)
RE: Christine Nelson MRN: [id number] DOB: 07-13-1991 Date of Telemedicine Visit: 04/27/2023  Referring provider: Deeann Saint, MD Primary care provider: Deeann Saint, MD  Chief Complaint: No chief complaint on file.   Telemedicine Follow Up Visit via WebEx: I connected with Christine Nelson for a follow up on 04/27/23 by MyChart Epic Video and verified that I am speaking with the correct person using two identifiers.   I discussed the limitations, risks, security and privacy concerns of performing an evaluation and management service by telemedicine and the availability of in person appointments. I also discussed with the patient that there may be a patient responsible charge related to this service. The patient expressed understanding and agreed to proceed.  Patient is at work.  Provider is at the office.  Visit start time: 1:07 PM Visit end time: 1:29 PM Insurance consent/check in by: Dr. Reece Agar Medical consent and medical assistant/nurse: Dr. Reece Agar  History of Present Illness:  She is a 32 y.o. female, who is being followed for chronic urticaria as well as mild intermittent asthma and perennial and seasonal allergic rhinitis. Her previous allergy office visit was in April 2024 with myself. She was last seen in April 2024.  At that time, urticaria was under good control with Zyrtec 1-2 times daily.  Her asthma, we did not do lung testing.  We added on Singulair to help with her breathing and rhinitis.  We continued with Breo 1 puff once daily and AirSupra as needed.  Since the last visit, she has largely done very well.   Asthma/Respiratory Symptom History: She reports that her breathing is much better. She reports that everything is much better. She is concerned  about the drainage. It is more so when she wakes up in the morning. She is on her Virgel Bouquet and this is working well for her. She did stop it because her symptoms were not as bad. Her breathing has been under good control. Spring  is the worst time of the year. She did have the AirSupra and was using it with   Allergic Rhinitis Symptom History: She does not consistently use a nose spray. It feels very strong. It is not the one that we prescribed.  She has not tried Ryaltris. She is going top meet her deductible and request a lot of refills. She would like information. She has a lot of exposures in her workplace. She is currently working at [employer] (recently changed to [employer]). She was taking the montelukast at night. She is unsure whjy she stopped it. She did not experience the irritability and bad dreams with it, but she is not sure why she ended up stopping.   There is not a health center at her workplace. But she works with a team of nurses who might be able to administer the shots. She is around a lot of environmental triggers with likely mold and dust.   Skin Symptom Symptom History: She has fairly good control of her urticaria. She has not had a lot of breakthrough symptoms.   She recently started phentermine. She ended up gaining weight back after the weight loss surgery. She takes that in the morning before breakfast. She is titrating her dose right now and her weight lsos surgeon is managing all of this.   Otherwise, there have been no changes to her past medical history, surgical history, family history, or social history.  Assessment and Plan:  [name] is a 32 y.o. female with:  Chronic urticaria   Mild intermittent asthma, uncomplicated - increasing treatment to a combined ICS/LABA   Perennial and seasonal allergic rhinitis (grasses, weeds, trees, outdoor molds, and cat)   Eczema as child   S/p bariatric surgery October 2022   Uterine fibroids - s/p surgery in July 2023   Interested in going to medical school    Certified doula  Unclear compliance with medications   We are going to try to maximize her postnasal drip control by restarting the montelukast at night.  She is  also going to get the Ryaltris to use 2 sprays per nostril at night.  Hopefully this will help control her postnasal drip.  We are going to send her to ENT, per patient request, to see if there is anything they can do from their end.  I offered to refill her Breo and respiratory medications, but since she is using them seasonally she has plenty of refills.  Finally, we are going to send her information on allergen immunotherapy.  She is going to be reaching her deductible soon and therefore allergy shots would be a good way to go.  Diagnostics: None.  Medication List:  Current Outpatient Medications  Medication Sig Dispense Refill   Albuterol-Budesonide (AIRSUPRA) 90-80 MCG/ACT AERO Inhale 2 puffs into the lungs every 4 (four) hours as needed. 10.7 g 5   calcium carbonate (TUMS EX) 750 MG chewable tablet Chew 750 mg by mouth See admin instructions. Take 750 mg at 2 pm and 750 mg at 4 pm     cetirizine (ZYRTEC) 10 MG tablet TAKE 1 TABLET BY MOUTH EVERY DAY 90 tablet 1   hydrocortisone 2.5 % ointment Apply topically 2 (two) times daily. 30 g 0   lansoprazole (PREVACID) 30 MG capsule TAKE 1 CAPSULE BY MOUTH EVERY DAY 90 capsule 1   levalbuterol (XOPENEX HFA) 45 MCG/ACT inhaler Inhale 1-2 puffs into the lungs every 6 (six) hours as needed for wheezing. 15 g 3   levothyroxine (SYNTHROID) 125 MCG tablet Take 1 tablet (125 mcg total) by mouth daily before breakfast. 90 tablet 3   montelukast (SINGULAIR) 10 MG tablet Take 1 tablet (10 mg total) by mouth at bedtime. 30 tablet 5   Multiple Vitamins-Minerals (BARIATRIC MULTIVITAMINS/IRON PO) Take 1 tablet by mouth daily at 12 noon.     Olopatadine-Mometasone (RYALTRIS) X543819 MCG/ACT SUSP Place 1 spray into the nose daily as needed. 29 g 5   phentermine 15 MG capsule Take by mouth.     sodium fluoride (PREVIDENT 5000 PLUS) 1.1 % CREA dental cream Place 1 Application onto teeth at bedtime.     No current facility-administered medications for this visit.    Allergies: No Known Allergies I reviewed her past medical history, social history, family history, and environmental history and no significant changes have been reported from previous visits.  Review of Systems  Constitutional: Negative.  Negative for fever.  HENT:  Positive for congestion, postnasal drip, rhinorrhea and sneezing. Negative for ear discharge and ear pain.   Eyes:  Negative for pain, discharge and redness.  Respiratory:  Negative for cough, shortness of breath and wheezing.   Cardiovascular: Negative.  Negative for chest pain and palpitations.  Gastrointestinal:  Negative for abdominal pain.  Skin: Negative.  Negative for rash.  Allergic/Immunologic: Positive for environmental allergies.  Neurological:  Negative for dizziness and headaches.  Hematological:  Does not bruise/bleed easily.    Objective:  Physical exam not obtained as encounter was done via telephone.   Previous  notes and tests were reviewed.  I discussed the assessment and treatment plan with the patient. The patient was provided an opportunity to ask questions and all were answered. The patient agreed with the plan and demonstrated an understanding of the instructions.   The patient was advised to call back or seek an in-person evaluation if the symptoms worsen or if the condition fails to improve as anticipated.  I provided 22 minutes of non-face-to-face time during this encounter.  It was my pleasure to participate in Stollings Epting's care today. Please feel free to contact me with any questions or concerns.   Sincerely,  Christine Spruce, MD

## 2023-05-10 IMAGING — DX DG CHEST 2V
2 series · 2 of 2 positions shown · non-contrast
Comparison: 04/10/2021

CLINICAL DATA: Epigastric pain

EXAM:
CHEST - 2 VIEW

[chest pa]
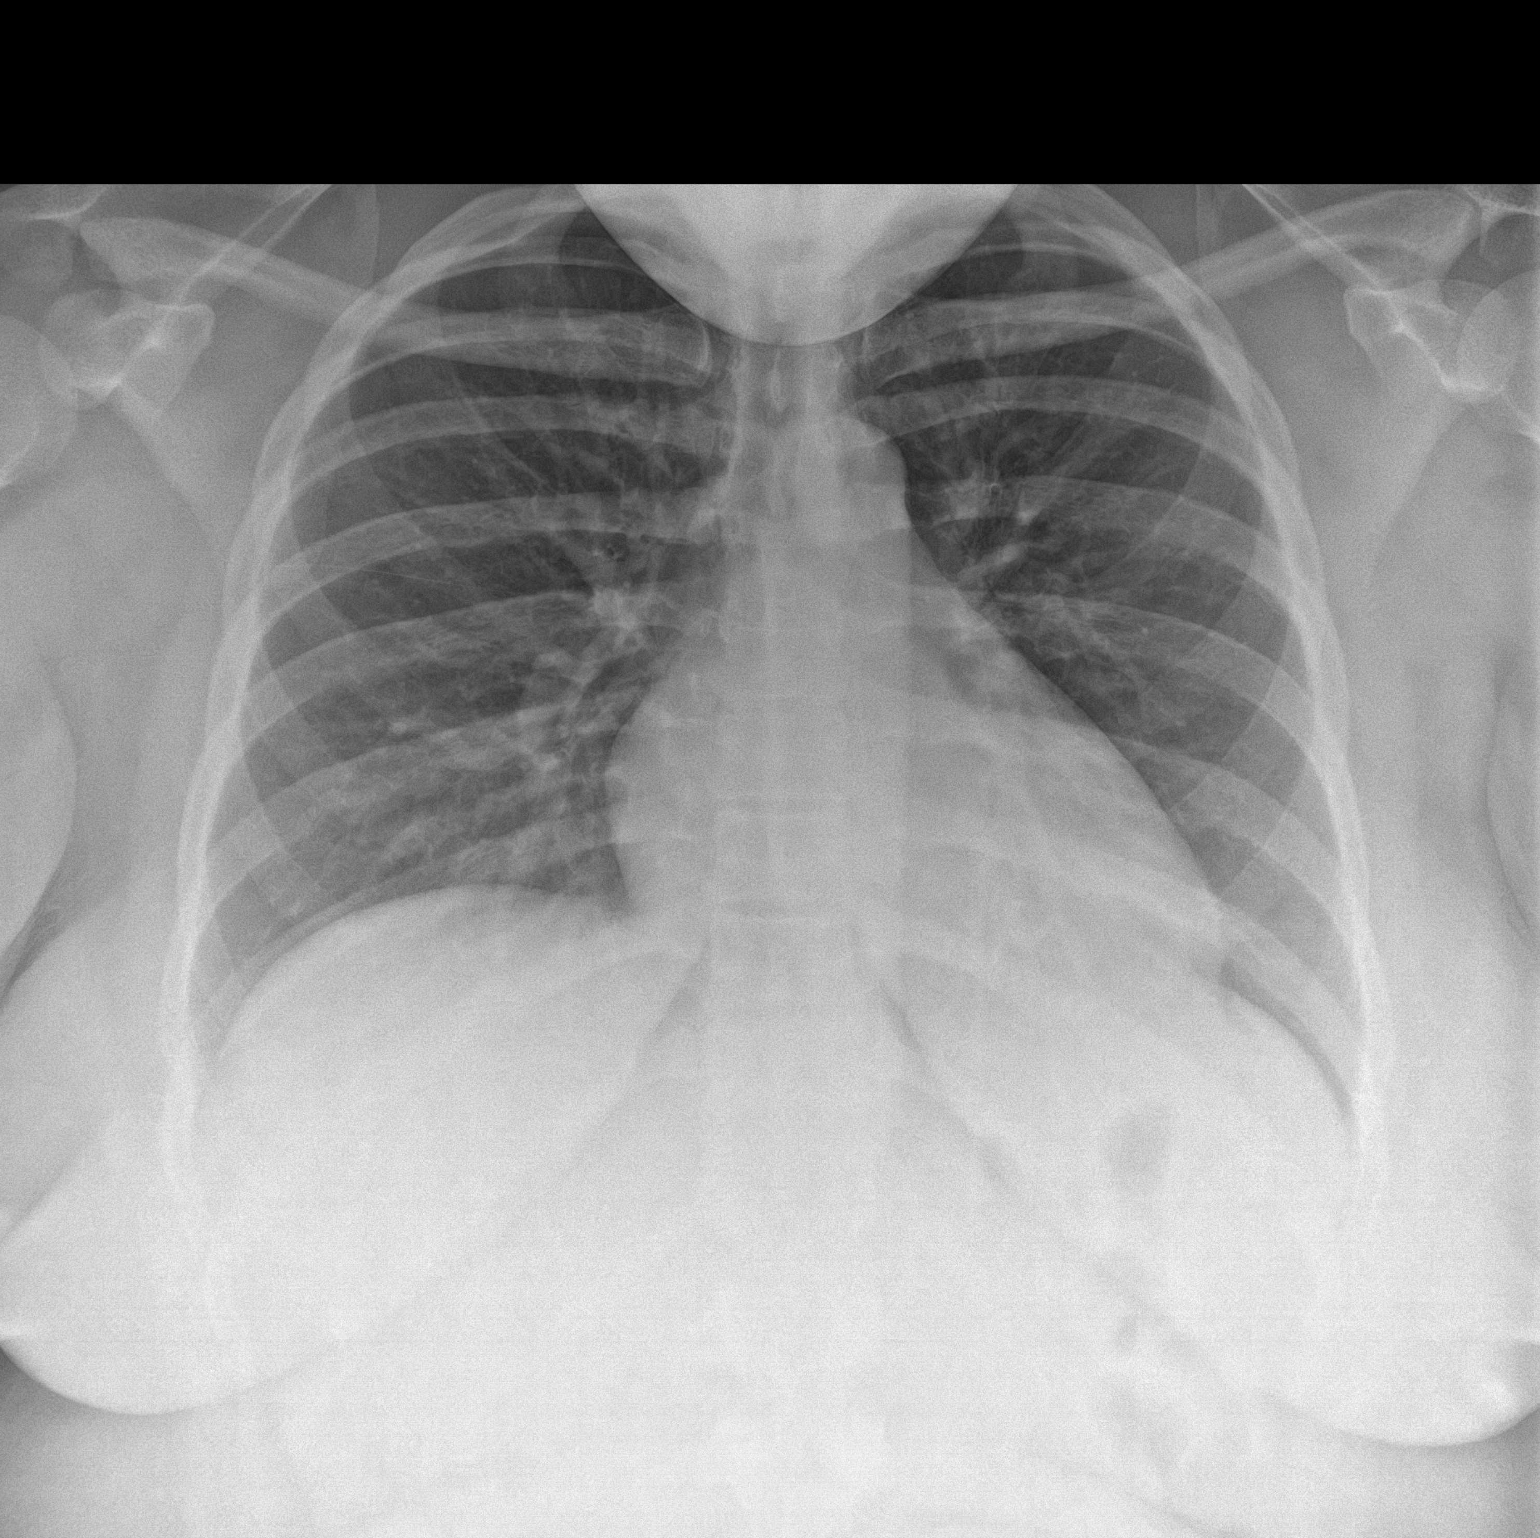

[chest lat]
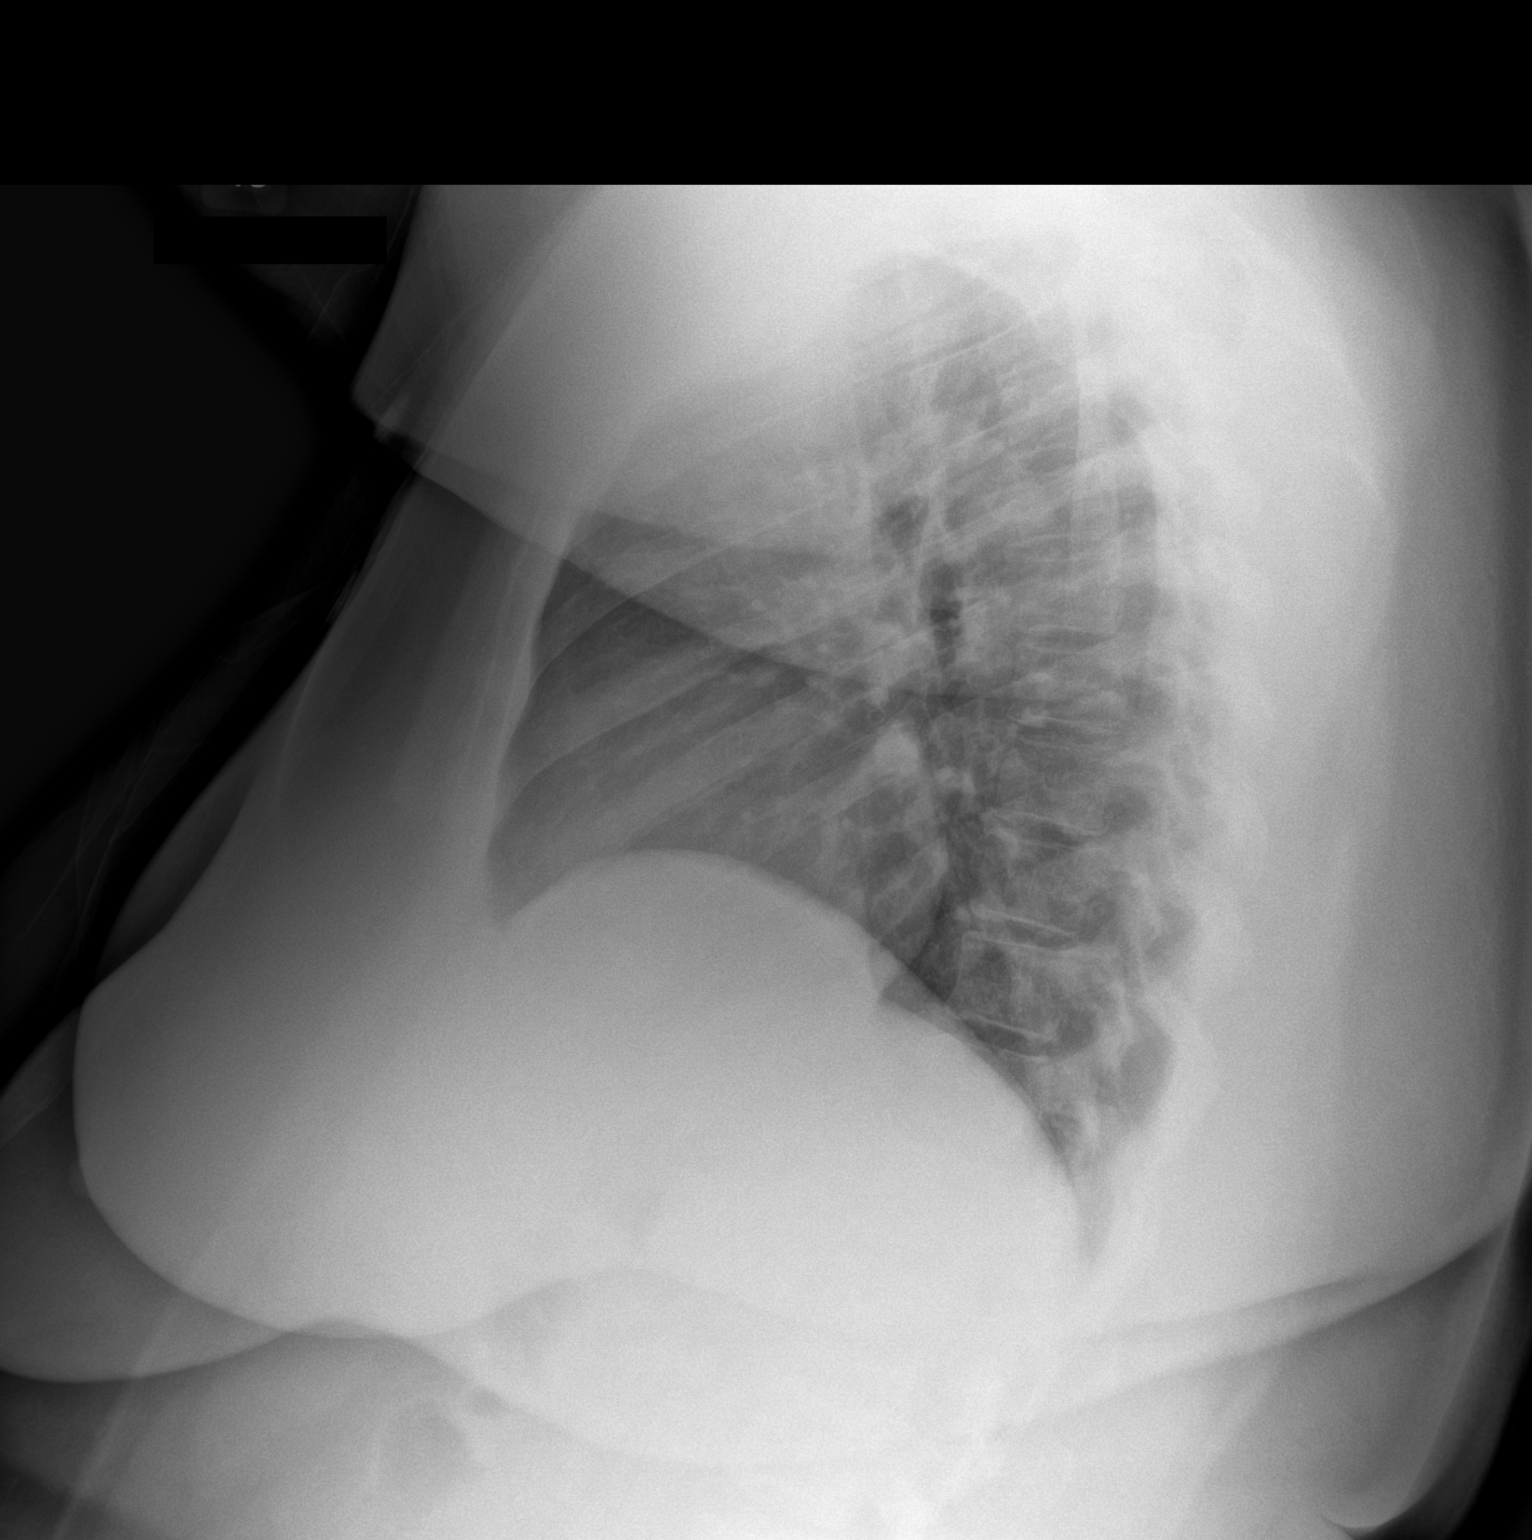

[2 of 2 positions shown; findings below may reference images not displayed]

FINDINGS: Transverse diameter of heart is slightly increased. There are no
signs of pulmonary edema or focal pulmonary consolidation. There is
no pleural effusion or pneumothorax. Patient's chin is partially
obscuring the apices.
IMPRESSION: There are no new infiltrates or signs of pulmonary edema.

## 2023-05-12 ENCOUNTER — Encounter: Payer: Self-pay | Admitting: Family Medicine

## 2023-05-12 ENCOUNTER — Telehealth: Payer: No Typology Code available for payment source | Admitting: Family Medicine

## 2023-05-12 DIAGNOSIS — F439 Reaction to severe stress, unspecified: Secondary | ICD-10-CM | POA: Diagnosis not present

## 2023-05-12 DIAGNOSIS — J302 Other seasonal allergic rhinitis: Secondary | ICD-10-CM | POA: Diagnosis not present

## 2023-05-12 DIAGNOSIS — R519 Headache, unspecified: Secondary | ICD-10-CM

## 2023-05-12 NOTE — Progress Notes (Unsigned)
Virtual Visit via Video Note  I connected with Christine Nelson on 05/12/23 at  3:15 PM EDT by a video enabled telemedicine application and verified that I am speaking with the correct person using two identifiers.  Location patient: home Location provider:work or home office Persons participating in the virtual visit: patient, provider  I discussed the limitations of evaluation and management by telemedicine and the availability of in person appointments. The patient expressed understanding and agreed to proceed. Chief Complaint  Patient presents with   Headache    Not sure when or where they have been coming from. Noticed it last week when had an outside event. Was not to the point where medication was needed, took allergy pill and HA went away, but came back. They are small, lingering and annoying and have been going on since last wk. Noticed she has been a little more stressed with work and other things. Is asking for work note for today, left early on Monday, was not able to make it yesterday or today. Is not taking the phentermine at this time    HPI: Patient is a 32 year old female seen for acute concern. Pt has a h/o allergies.  Doesn't always take meds.  Was outside for several hrs, then developed a HA.  HA is minor in frontal area or behind L eye, but have been intermittent daily for the last wk.  Pt hasn't taken anything because it was so mild.  Also limited in the amount of tylenol she can take due to liver cyst.  Pt is a Biomedical scientist.  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  Did not go in today or yesterday.  Pt sleeping more.  Having to force herself to get out of the house to do some things.  Seeing a therapist.  Spoke with her today.  Pt with increased stress a work.  Pt doing the job of 2 ppl but not getting paid for it. There are no plans to hire someone.    Pt just had appt with Endo.  Thyroid is fine.  Taking Synthroid 125 mcg daily.  ROS: See pertinent positives and negatives per  HPI.  Past Medical History:  Diagnosis Date   Asthma    with allergy season   Fibroids    GERD (gastroesophageal reflux disease)    History of prediabetes    Hypothyroidism    Liver masses    followed by Sophronia Simas MD .   Obesity    PCOS (polycystic ovarian syndrome)    Uterine leiomyoma    Vitamin D deficiency     Past Surgical History:  Procedure Laterality Date   BIOPSY  05/09/2021   Procedure: BIOPSY;  Surgeon: Quentin Ore, MD;  Location: [facility];  Service: General;;   ESOPHAGOGASTRODUODENOSCOPY N/A 05/09/2021   Procedure: ESOPHAGOGASTRODUODENOSCOPY (EGD) with biopsies;  Surgeon: Quentin Ore, MD;  Location: [facility];  Service: General;  Laterality: N/A;   LAPAROSCOPIC GASTRIC SLEEVE RESECTION  07/15/2021   LAPAROSCOPIC GELPORT ASSISTED MYOMECTOMY N/A 04/15/2022   Procedure: LAPAROSCOPIC GELPORT ASSISTED MYOMECTOMY;  Surgeon: Fermin Schwab, MD;  Location[facility];  Service: Gynecology;  Laterality: N/A;   UPPER GI ENDOSCOPY N/A 07/15/2021   Procedure: UPPER GI ENDOSCOPY;  Surgeon: Quentin Ore, MD;  Location: [facility]S;  Service: General;  Laterality: N/A;   WISDOM TOOTH EXTRACTION  2011    Family History  Problem Relation Age of Onset   Hypertension Mother    Thyroid disease Father    Hypertension  Maternal Grandfather    Stroke Maternal Grandfather    Colon cancer Neg Hx    Esophageal cancer Neg Hx    Rectal cancer Neg Hx     Current Outpatient Medications:    Albuterol-Budesonide (AIRSUPRA) 90-80 MCG/ACT AERO, Inhale 2 puffs into the lungs every 4 (four) hours as needed., Disp: 10.7 g, Rfl: 5   calcium carbonate (TUMS EX) 750 MG chewable tablet, Chew 750 mg by mouth See admin instructions. Take 750 mg at 2 pm and 750 mg at 4 pm, Disp: , Rfl:    cetirizine (ZYRTEC) 10 MG tablet, TAKE 1 TABLET BY MOUTH EVERY DAY, Disp: 90 tablet, Rfl: 1   fluticasone furoate-vilanterol (BREO ELLIPTA) 100-25 MCG/ACT AEPB, Inhale 1 puff into the lungs  daily., Disp: , Rfl:    hydrocortisone 2.5 % ointment, Apply topically 2 (two) times daily., Disp: 30 g, Rfl: 0   lansoprazole (PREVACID) 30 MG capsule, TAKE 1 CAPSULE BY MOUTH EVERY DAY, Disp: 90 capsule, Rfl: 1   levalbuterol (XOPENEX HFA) 45 MCG/ACT inhaler, Inhale 1-2 puffs into the lungs every 6 (six) hours as needed for wheezing., Disp: 15 g, Rfl: 3   levothyroxine (SYNTHROID) 125 MCG tablet, Take 1 tablet (125 mcg total) by mouth daily before breakfast., Disp: 90 tablet, Rfl: 3   montelukast (SINGULAIR) 10 MG tablet, Take 1 tablet (10 mg total) by mouth at bedtime., Disp: 90 tablet, Rfl: 1   Multiple Vitamins-Minerals (BARIATRIC MULTIVITAMINS/IRON PO), Take 1 tablet by mouth daily at 12 noon., Disp: , Rfl:    Olopatadine-Mometasone (RYALTRIS) 665-25 MCG/ACT SUSP, Place 1 spray into the nose daily as needed., Disp: 29 g, Rfl: 5   sodium fluoride (PREVIDENT 5000 PLUS) 1.1 % CREA dental cream, Place 1 Application onto teeth at bedtime., Disp: , Rfl:    phentermine 15 MG capsule, Take by mouth., Disp: , Rfl:   EXAM:  VITALS per patient if applicable: RR between 12-20 bpm  GENERAL: alert, oriented, appears well and in no acute distress  HEENT: atraumatic, conjunctiva clear, no obvious abnormalities on inspection of external nose and ears  NECK: normal movements of the head and neck  LUNGS: on inspection no signs of respiratory distress, breathing rate appears normal, no obvious gross SOB, gasping or wheezing  CV: no obvious cyanosis  MS: moves all visible extremities without noticeable abnormality  PSYCH/NEURO: pleasant and cooperative, no obvious depression or anxiety, speech and thought processing grossly intact     05/12/2023    4:04 PM 12/31/2021    2:11 PM 10/17/2021    9:44 AM  Depression screen PHQ 2/9  Decreased Interest 1 0 0  Down, Depressed, Hopeless 1 0 0  PHQ - 2 Score 2 0 0  Altered sleeping 1  0  Tired, decreased energy 0  0  Change in appetite 0  0  Feeling bad  or failure about yourself  1  0  Trouble concentrating 0  0  Moving slowly or fidgety/restless 0  0  Suicidal thoughts 0  0  PHQ-9 Score 4  0  Difficult doing work/chores Somewhat difficult        05/12/2023    4:07 PM 07/11/2021   11:47 AM 05/30/2020   11:07 AM  GAD 7 : Generalized Anxiety Score  Nervous, Anxious, on Edge 0 1 0  Control/stop worrying 1 0 0  Worry too much - different things 1 1 0  Trouble relaxing 0 0 0  Restless 0 0 0  Easily annoyed or irritable 0  0 0  Afraid - awful might happen 0 0 0  Total GAD 7 Score 2 2 0  Anxiety Difficulty Somewhat difficult Not difficult at all Not difficult at all      ASSESSMENT AND PLAN:  Discussed the following assessment and plan:  Frequent headaches  Seasonal allergies  Stress   New problems.  Recent headaches likely multifactorial including seasonal allergies symptoms and stress.  Discussed consistent use of OTC antihistamine, saline nasal rinse, or Flonase.  Discussed headache prevention.  Patient will check blood pressure to see if it is elevated and contributing to symptoms.  Discussed self-care and other ways to decrease stress.  PHQ-9 score 4, GAD-7 score 2.  Medication options not indicated at this time.  Continue counseling.   I discussed the assessment and treatment plan with the patient. The patient was provided an opportunity to ask questions and all were answered. The patient agreed with the plan and demonstrated an understanding of the instructions.   The patient was advised to call back or seek an in-person evaluation if the symptoms worsen or if the condition fails to improve as anticipated.   Deeann Saint, MD

## 2023-05-18 ENCOUNTER — Encounter (INDEPENDENT_AMBULATORY_CARE_PROVIDER_SITE_OTHER): Payer: 59 | Admitting: Family Medicine

## 2023-06-11 ENCOUNTER — Ambulatory Visit: Payer: 59 | Admitting: Internal Medicine

## 2023-06-16 ENCOUNTER — Other Ambulatory Visit: Payer: Self-pay | Admitting: Family Medicine

## 2023-06-16 DIAGNOSIS — K219 Gastro-esophageal reflux disease without esophagitis: Secondary | ICD-10-CM

## 2023-06-18 ENCOUNTER — Other Ambulatory Visit: Payer: Self-pay | Admitting: Family Medicine

## 2023-06-18 DIAGNOSIS — K219 Gastro-esophageal reflux disease without esophagitis: Secondary | ICD-10-CM

## 2023-06-21 ENCOUNTER — Other Ambulatory Visit: Payer: Self-pay | Admitting: Family Medicine

## 2023-06-21 DIAGNOSIS — K219 Gastro-esophageal reflux disease without esophagitis: Secondary | ICD-10-CM

## 2023-06-22 ENCOUNTER — Encounter: Payer: Self-pay | Admitting: Family Medicine

## 2023-06-22 ENCOUNTER — Other Ambulatory Visit: Payer: Self-pay | Admitting: Family Medicine

## 2023-06-22 DIAGNOSIS — K219 Gastro-esophageal reflux disease without esophagitis: Secondary | ICD-10-CM

## 2023-06-23 NOTE — Telephone Encounter (Signed)
Pt called to request a refill of the:    lansoprazole (PREVACID) 30 MG capsule  Pt states she is in dire need of this refill.  Pt informed MD is OOO today.  [id number] - [place], [place] - [address] Phone: [phone]  Fax: [phone]

## 2023-06-23 NOTE — Telephone Encounter (Signed)
Called patient she is aware Dr. Salomon Fick is 000.

## 2023-07-02 ENCOUNTER — Ambulatory Visit (INDEPENDENT_AMBULATORY_CARE_PROVIDER_SITE_OTHER): Payer: No Typology Code available for payment source | Admitting: Family Medicine

## 2023-07-02 ENCOUNTER — Encounter: Payer: Self-pay | Admitting: Family Medicine

## 2023-07-02 VITALS — BP 132/72 | HR 93 | Temp 98.4°F | Ht 63.0 in | Wt 321.4 lb

## 2023-07-02 DIAGNOSIS — J9801 Acute bronchospasm: Secondary | ICD-10-CM

## 2023-07-02 DIAGNOSIS — J302 Other seasonal allergic rhinitis: Secondary | ICD-10-CM | POA: Diagnosis not present

## 2023-07-02 DIAGNOSIS — R195 Other fecal abnormalities: Secondary | ICD-10-CM | POA: Diagnosis not present

## 2023-07-02 DIAGNOSIS — Z903 Acquired absence of stomach [part of]: Secondary | ICD-10-CM

## 2023-07-02 NOTE — Progress Notes (Signed)
Established Patient Office Visit   Subjective  Patient ID: Christine Nelson, female    DOB: 1990-10-11  Age: 32 y.o. MRN: [id number]  Chief Complaint  Patient presents with   Chest Pain    Pt is a 32 yo female seen for f/u on ongoing concerns.  Pt w/ h/o gastric sleeve with wt gain.  Endorses loose stools after returning from trip to the [place].  Symptoms improved, then returned with certain foods like chocolate chip cookie, and french fries.  At recent follow up pt told she may be developing dumping syndrome.  Pt notes episode of chest discomfort, "like something was there".  Sensation was like if she burped it may help.  Had a slight cough with the feeling.  Inhaler and allergy medication helped.  Denies chest pain/pressure or heartburn.  Pt still trying to make decisions about career.  Back and forth between nursing school or med school.  Still has to take prerequisites.    Patient Active Problem List   Diagnosis Date Noted   Chronic urticaria 07/16/2022   Mild intermittent asthma, uncomplicated 07/16/2022   Seasonal and perennial allergic rhinitis 07/16/2022   Gastro-esophageal reflux disease without esophagitis 04/03/2022   Status post gastric surgery 09/15/2021   Morbid obesity with BMI of 50.0-59.9, adult (HCC) 07/15/2021   Fibroid 08/24/2020   Iron deficiency anemia due to chronic blood loss 08/24/2020   Seasonal allergies 08/24/2020   Polycystic ovary syndrome 04/21/2019   History of hypothyroidism 03/15/2019   Class 3 severe obesity due to excess calories without serious comorbidity with body mass index (BMI) of 50.0 to 59.9 in adult [facility]) 03/15/2019   Prediabetes 02/04/2018   Hypothyroidism 02/04/2018   Excessive bleeding in premenopausal period 02/04/2018   Insulin resistance 09/22/2012   Past Medical History:  Diagnosis Date   Asthma    with allergy season   Fibroids    GERD (gastroesophageal reflux disease)    History of prediabetes     Hypothyroidism    Liver masses    followed by Sophronia Simas MD .   Obesity    PCOS (polycystic ovarian syndrome)    Uterine leiomyoma    Vitamin D deficiency    Past Surgical History:  Procedure Laterality Date   BIOPSY  05/09/2021   Procedure: BIOPSY;  Surgeon: Quentin Ore, MD;  Location: [facility];  Service: General;;   ESOPHAGOGASTRODUODENOSCOPY N/A 05/09/2021   Procedure: ESOPHAGOGASTRODUODENOSCOPY (EGD) with biopsies;  Surgeon: Quentin Ore, MD;  Location: [facility];  Service: General;  Laterality: N/A;   LAPAROSCOPIC GASTRIC SLEEVE RESECTION  07/15/2021   LAPAROSCOPIC GELPORT ASSISTED MYOMECTOMY N/A 04/15/2022   Procedure: LAPAROSCOPIC GELPORT ASSISTED MYOMECTOMY;  Surgeon: Fermin Schwab, MD;  Location: [facility] OR;  Service: Gynecology;  Laterality: N/A;   UPPER GI ENDOSCOPY N/A 07/15/2021   Procedure: UPPER GI ENDOSCOPY;  Surgeon: Quentin Ore, MD;  Location: [facility];  Service: General;  Laterality: N/A;   WISDOM TOOTH EXTRACTION  2011   Social History   Tobacco Use   Smoking status: Never   Smokeless tobacco: Never  Vaping Use   Vaping status: Never Used  Substance Use Topics   Alcohol use: Never   Drug use: Never   Family History  Problem Relation Age of Onset   Hypertension Mother    Thyroid disease Father    Hypertension Maternal Grandfather    Stroke Maternal Grandfather    Colon cancer Neg Hx    Esophageal cancer Neg Hx  Rectal cancer Neg Hx    No Known Allergies    ROS Negative unless stated above    Objective:     BP 132/72 (BP Location: Left Arm, Patient Position: Sitting, Cuff Size: Large)   Pulse 93   Temp 98.4 F (36.9 C) (Oral)   Ht 5\' 3"  (1.6 m)   Wt (!) 321 lb 6.4 oz (145.8 kg)   LMP 06/01/2023 (Exact Date)   SpO2 93%   BMI 56.93 kg/m  BP Readings from Last 3 Encounters:  07/02/23 132/72  04/02/23 120/80  01/19/23 100/76   Wt Readings from Last 3 Encounters:  07/02/23 (!) 321 lb 6.4 oz (145.8 kg)   04/02/23 (!) 321 lb 6.4 oz (145.8 kg)  12/31/22 295 lb (133.8 kg)      Physical Exam Constitutional:      General: She is not in acute distress.    Appearance: Normal appearance.  HENT:     Head: Normocephalic and atraumatic.     Nose: Nose normal.     Mouth/Throat:     Mouth: Mucous membranes are moist.  Cardiovascular:     Rate and Rhythm: Normal rate and regular rhythm.     Heart sounds: Normal heart sounds. No murmur heard.    No gallop.  Pulmonary:     Effort: Pulmonary effort is normal. No respiratory distress.     Breath sounds: Normal breath sounds. No wheezing, rhonchi or rales.  Skin:    General: Skin is warm and dry.  Neurological:     Mental Status: She is alert and oriented to person, place, and time.      No results found for any visits on 07/02/23.    Assessment & Plan:  Seasonal allergies  Bronchospasm  Loose stools  S/P gastric sleeve procedure -Body mass index is 56.93 kg/m.  Episodic chest discomfort relieved by inhaler use likely due to bronchospasms possibly triggered byallergies.  Discussed OTC antihistamine or Flonase nasal spray.  Patient encouraged to keep food diary to monitor GI symptoms.  Continue follow-up with GI/bariatric surgery.  Discussed importance of daily multivitamin.  Patient encouraged to increase physical activity.  Monitor weight.  Return if symptoms worsen or fail to improve.   Deeann Saint, MD

## 2023-07-10 ENCOUNTER — Encounter: Payer: Self-pay | Admitting: Family Medicine

## 2023-07-12 ENCOUNTER — Other Ambulatory Visit: Payer: Self-pay | Admitting: Family Medicine

## 2023-07-12 DIAGNOSIS — L309 Dermatitis, unspecified: Secondary | ICD-10-CM

## 2023-07-12 IMAGING — CT CT ABD-PELV W/O CM
2 of 4 series · 16 of 46 positions shown, 18 images · non-contrast
Comparison: 10/14/2021

CLINICAL DATA: Dysuria, pelvic pain



[Series 2: abd pel wo · axial · 0.94mm/px · z∈[-401,+19]mm · 13 of 92 slices shown, 15 images]
[im 4/92  soft-tissue]
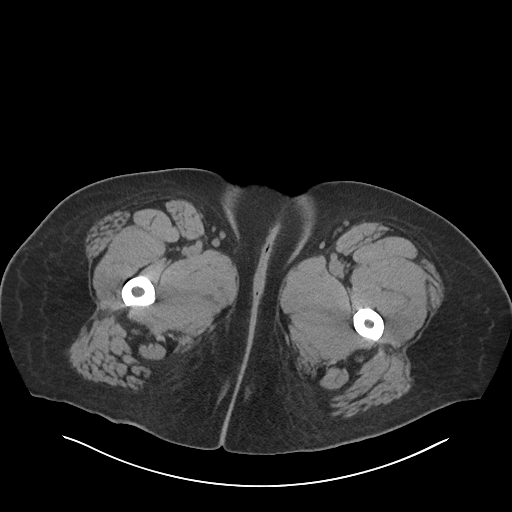
[im 4/92  bone]
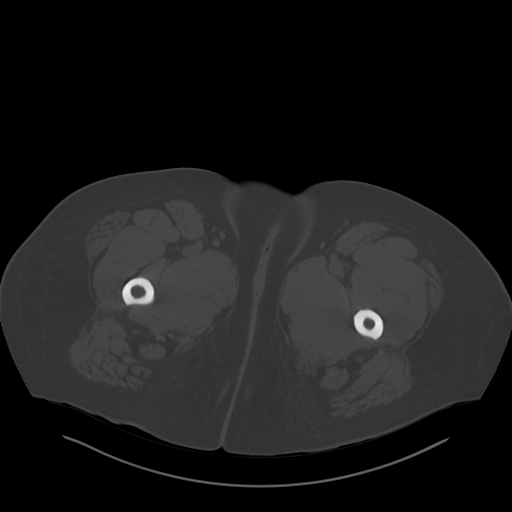
[im 12/92  soft-tissue]
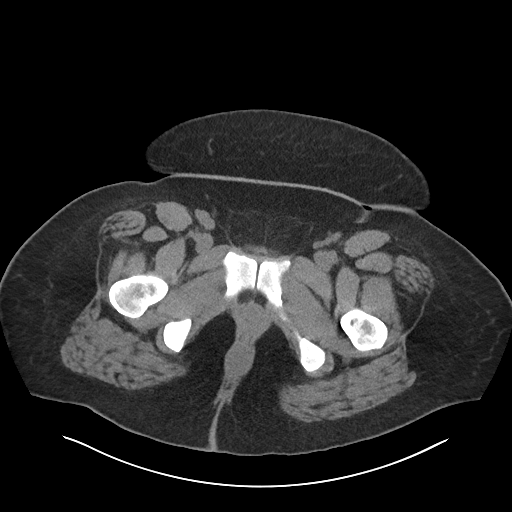
[im 20/92  soft-tissue]
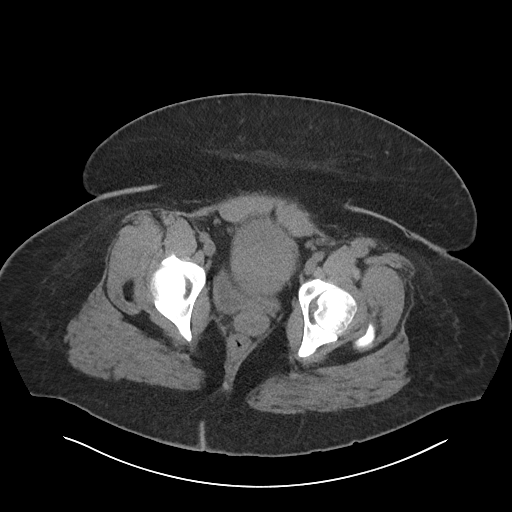
[im 24/92  soft-tissue]
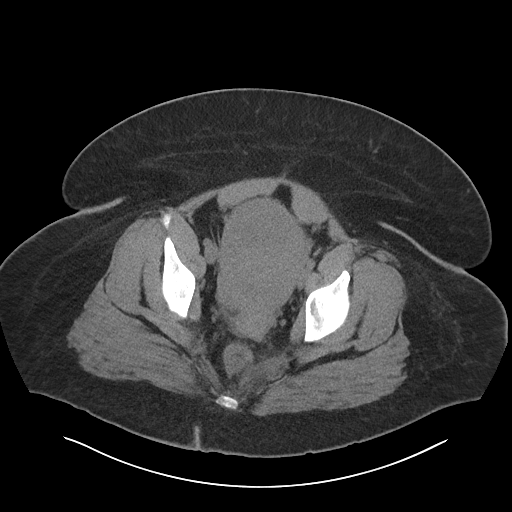
[im 32/92  soft-tissue]
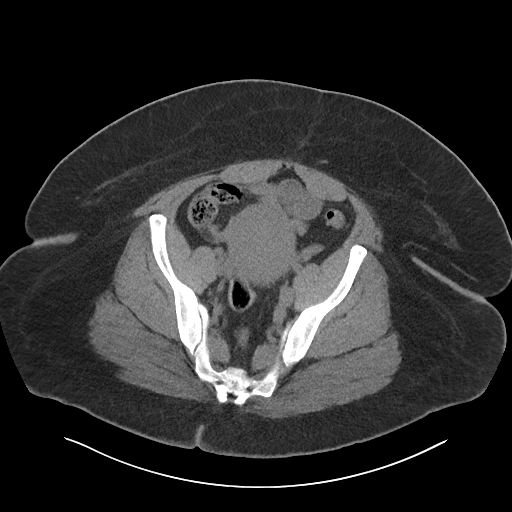
[im 40/92  soft-tissue]
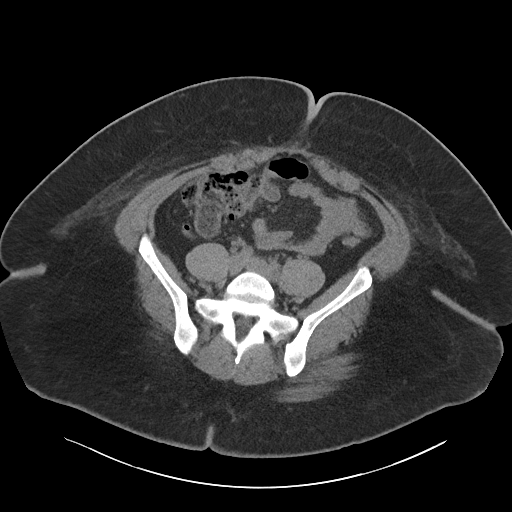
[im 48/92  soft-tissue]
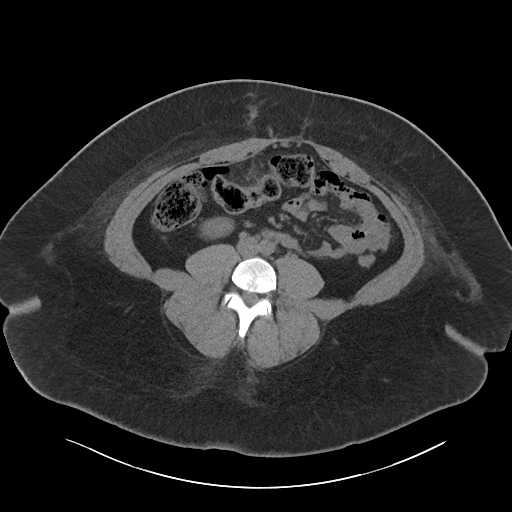
[im 52/92  soft-tissue]
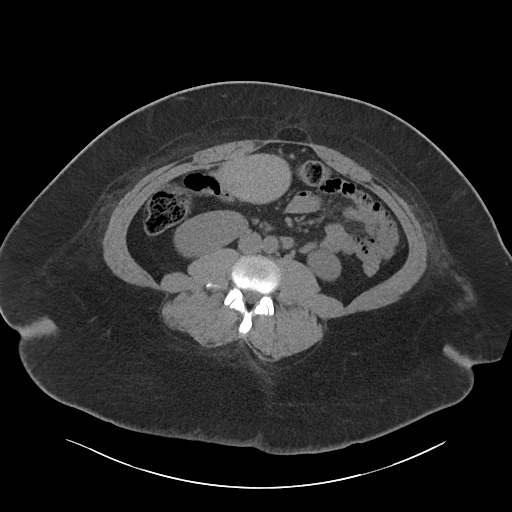
[im 60/92  soft-tissue]
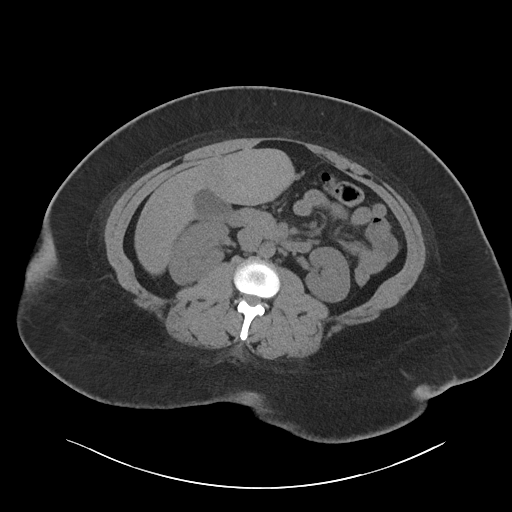
[im 60/92  bone]
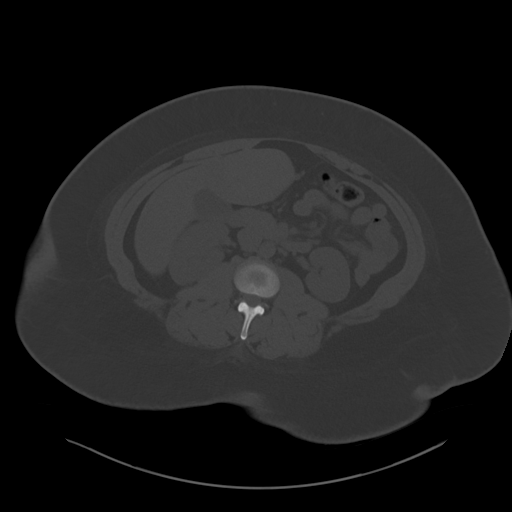
[im 68/92  soft-tissue]
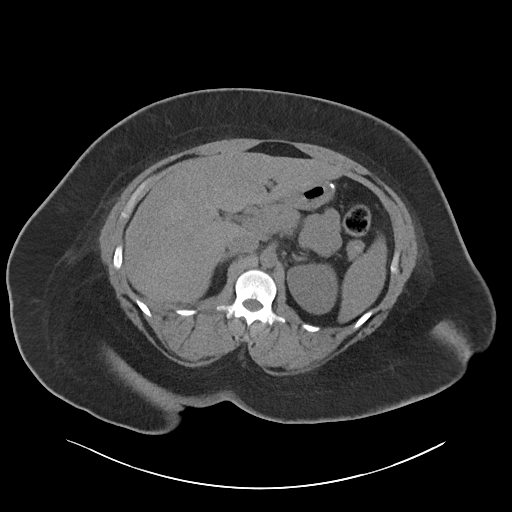
[im 72/92  soft-tissue]
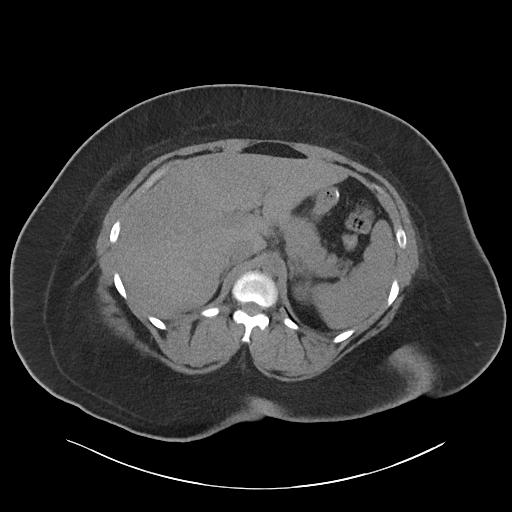
[im 80/92  soft-tissue]
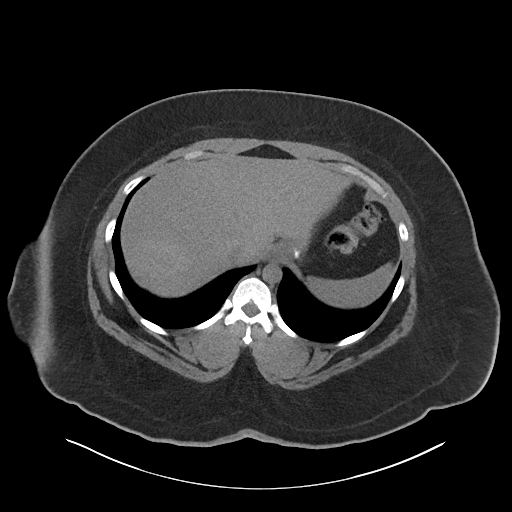
[im 88/92  soft-tissue]
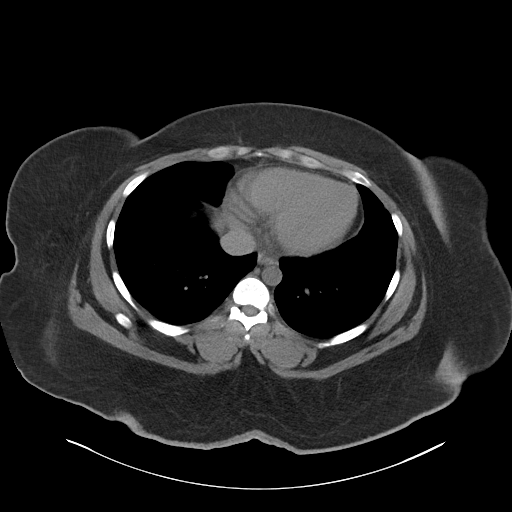

[Series 5: coronal · coronal · 0.89mm/px · 3 of 131 slices shown]
[im 44/131  soft-tissue]
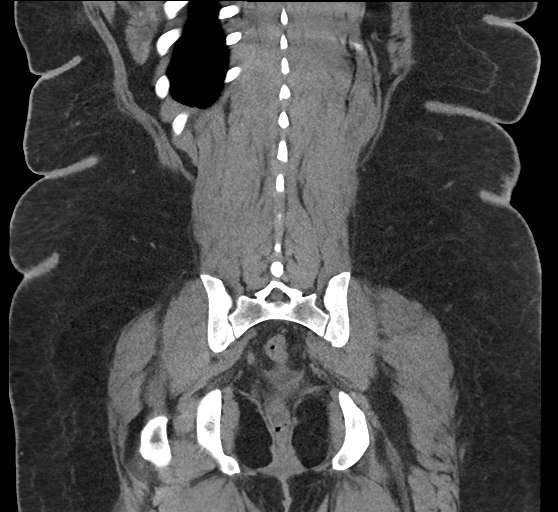
[im 58/131  soft-tissue]
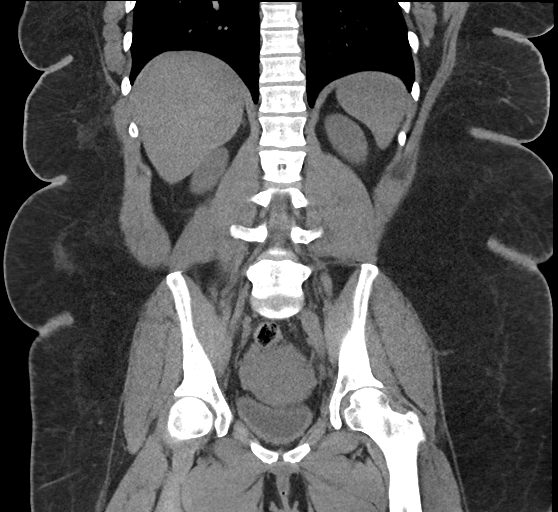
[im 73/131  soft-tissue]
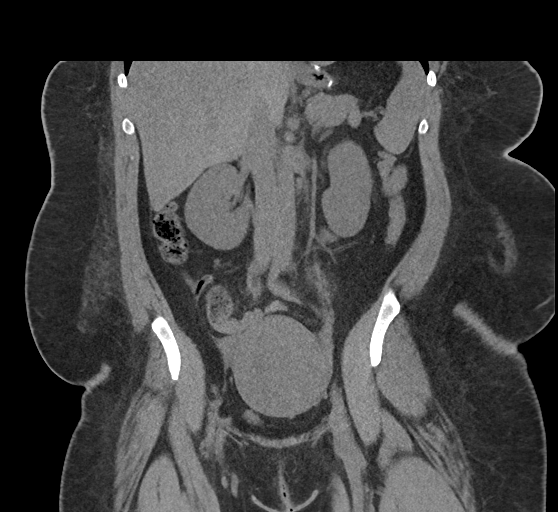

[16 of 46 positions shown; findings below may reference images not displayed]

FINDINGS: Lower chest: Lung bases are clear.

Hepatobiliary: Liver is notable for a dominant 8.0 x 7.0 cm mass in
segment 7 (series 2/image 17) and a dominant 5.3 x 7.7 cm mass in
segment 3 (series 2/image 37). These are both stable versus
minimally decreased from the recent prior CT. Differential
considerations (as noted on prior MRI) include FNH (statistically
favored) versus hepatic adenoma.

Gallbladder is notable for mild gallbladder sludge (series 2/image
31), without associated inflammatory changes. No intrahepatic or
extrahepatic ductal dilatation.

Pancreas: Within normal limits.

Spleen: Within normal limits.

Adrenals/Urinary Tract: Adrenal glands are within normal limits.

Kidneys are within normal limits.  No hydronephrosis.

Bladder is underdistended but unremarkable.

Stomach/Bowel: Postsurgical changes related to gastric sleeve.

No evidence of bowel obstruction.

Normal appendix (series 2/image 49).

No colonic wall thickening or inflammatory changes.

Vascular/Lymphatic: No evidence of abdominal aortic aneurysm.

No suspicious abdominopelvic lymphadenopathy.

Reproductive: Large fundal uterine fibroid, better evaluated on the
prior.

Bilateral ovaries are within normal limits.

Other: No abdominopelvic ascites.

Musculoskeletal: Visualized osseous structures are within normal
limits.
IMPRESSION: Uterine fibroids.

Two dominant hepatic masses measuring up to 8.0 cm, similar to the
prior. Differential considerations include FNH (statistically
favored) versus hepatic adenomas. As recommended on prior MRI,
follow-up MRI abdomen with/without Eovist contrast is suggested in 3
months.

No interval change from priors.

## 2023-08-06 ENCOUNTER — Ambulatory Visit (INDEPENDENT_AMBULATORY_CARE_PROVIDER_SITE_OTHER): Payer: No Typology Code available for payment source | Admitting: Family Medicine

## 2023-08-06 ENCOUNTER — Other Ambulatory Visit: Payer: No Typology Code available for payment source

## 2023-08-06 ENCOUNTER — Encounter: Payer: Self-pay | Admitting: Family Medicine

## 2023-08-06 VITALS — BP 120/80 | HR 69 | Temp 98.4°F | Ht 63.0 in | Wt 318.0 lb

## 2023-08-06 DIAGNOSIS — M533 Sacrococcygeal disorders, not elsewhere classified: Secondary | ICD-10-CM | POA: Diagnosis not present

## 2023-08-06 DIAGNOSIS — N926 Irregular menstruation, unspecified: Secondary | ICD-10-CM

## 2023-08-06 DIAGNOSIS — Z3201 Encounter for pregnancy test, result positive: Secondary | ICD-10-CM

## 2023-08-06 LAB — POC URINALSYSI DIPSTICK (AUTOMATED)
Bilirubin, UA: NEGATIVE
Blood, UA: NEGATIVE
Glucose, UA: NEGATIVE
Ketones, UA: NEGATIVE
Leukocytes, UA: NEGATIVE
Nitrite, UA: NEGATIVE
Protein, UA: POSITIVE — AB
Spec Grav, UA: 1.015 (ref 1.010–1.025)
Urobilinogen, UA: 0.2 U/dL
pH, UA: 6 (ref 5.0–8.0)

## 2023-08-06 LAB — HCG, QUANTITATIVE, PREGNANCY: Quantitative HCG: 1664 m[IU]/mL

## 2023-08-06 LAB — POCT URINE PREGNANCY: Preg Test, Ur: POSITIVE — AB

## 2023-08-06 NOTE — Progress Notes (Signed)
Established Patient Office Visit   Subjective  Patient ID: Christine Nelson, female    DOB: [birth year]  Age: 32 y.o. MRN: [id number]  Chief Complaint  Patient presents with   Back Pain    Lower back pain started 2 weeks ago, cramping, possible pregnancy missed period in sept, patient has stop taking phentermine due to birth defects,     Patient is a 32 year old female seen for ongoing concern.  Patient endorses midline low back/sacral pain x 2 weeks.  Patient increase water intake as well as symptoms may be related to urinary issue.  Patient also mentions change in menses.  Had a normal period August 26-29.  No menses in September.  Negative pregnancy test at OB/GYN appointment 06/30/2023.  Irregular bleeding October 5-8th with dark blood/spotting.  Patient endorses regular unprotected intercourse.  Not on OCPs 2/2 history of liver adenoma.  Patient had a positive pregnancy test October 24 and a second test appeared to be negative but later returned positive.  Patient was taking phentermine for several days in mid October but has stopped.  Not currently on PNVs.  Back Pain    Patient Active Problem List   Diagnosis Date Noted   Chronic urticaria 07/16/2022   Mild intermittent asthma, uncomplicated 07/16/2022   Seasonal and perennial allergic rhinitis 07/16/2022   Gastro-esophageal reflux disease without esophagitis 04/03/2022   Status post gastric surgery 09/15/2021   Morbid obesity with BMI of 50.0-59.9, adult (HCC) 07/15/2021   Fibroid 08/24/2020   Iron deficiency anemia due to chronic blood loss 08/24/2020   Seasonal allergies 08/24/2020   Polycystic ovary syndrome 04/21/2019   History of hypothyroidism 03/15/2019   Class 3 severe obesity due to excess calories without serious comorbidity with body mass index (BMI) of 50.0 to 59.9 in adult [facility]) 03/15/2019   Prediabetes 02/04/2018   Hypothyroidism 02/04/2018   Excessive bleeding in premenopausal period 02/04/2018   Insulin  resistance 09/22/2012   Past Medical History:  Diagnosis Date   Asthma    with allergy season   Fibroids    GERD (gastroesophageal reflux disease)    History of prediabetes    Hypothyroidism    Liver masses    followed by Sophronia Simas MD .   Obesity    PCOS (polycystic ovarian syndrome)    Uterine leiomyoma    Vitamin D deficiency    Past Surgical History:  Procedure Laterality Date   BIOPSY  05/09/2021   Procedure: BIOPSY;  Surgeon: Quentin Ore, MD;  Location: [facility];  Service: General;;   ESOPHAGOGASTRODUODENOSCOPY N/A 05/09/2021   Procedure: ESOPHAGOGASTRODUODENOSCOPY (EGD) with biopsies;  Surgeon: Quentin Ore, MD;  Location: [facility];  Service: General;  Laterality: N/A;   LAPAROSCOPIC GASTRIC SLEEVE RESECTION  07/15/2021   LAPAROSCOPIC GELPORT ASSISTED MYOMECTOMY N/A 04/15/2022   Procedure: LAPAROSCOPIC GELPORT ASSISTED MYOMECTOMY;  Surgeon: Fermin Schwab, MD;  Location: [facility] OR;  Service: Gynecology;  Laterality: N/A;   UPPER GI ENDOSCOPY N/A 07/15/2021   Procedure: UPPER GI ENDOSCOPY;  Surgeon: Quentin Ore, MD;  Location: [facility];  Service: General;  Laterality: N/A;   WISDOM TOOTH EXTRACTION  2011   Social History   Tobacco Use   Smoking status: Never   Smokeless tobacco: Never  Vaping Use   Vaping status: Never Used  Substance Use Topics   Alcohol use: Never   Drug use: Never   Family History  Problem Relation Age of Onset   Hypertension Mother    Thyroid disease Father  Hypertension Maternal Grandfather    Stroke Maternal Grandfather    Colon cancer Neg Hx    Esophageal cancer Neg Hx    Rectal cancer Neg Hx    No Known Allergies    Review of Systems  Musculoskeletal:  Positive for back pain.   Negative unless stated above    Objective:     BP 120/80 (BP Location: Left Arm, Patient Position: Sitting, Cuff Size: Normal)   Pulse 69   Temp 98.4 F (36.9 C) (Oral)   Ht 5\' 3"  (1.6 m)   Wt (!) 318 lb (144.2  kg)   SpO2 98%   BMI 56.33 kg/m  BP Readings from Last 3 Encounters:  08/06/23 120/80  07/02/23 132/72  04/02/23 120/80   Wt Readings from Last 3 Encounters:  08/06/23 (!) 318 lb (144.2 kg)  07/02/23 (!) 321 lb 6.4 oz (145.8 kg)  04/02/23 (!) 321 lb 6.4 oz (145.8 kg)      Physical Exam Constitutional:      Appearance: Normal appearance. She is obese.  HENT:     Head: Normocephalic and atraumatic.     Nose: Nose normal.     Mouth/Throat:     Mouth: Mucous membranes are moist.  Eyes:     Extraocular Movements: Extraocular movements intact.     Conjunctiva/sclera: Conjunctivae normal.     Pupils: Pupils are equal, round, and reactive to light.  Cardiovascular:     Rate and Rhythm: Normal rate.  Pulmonary:     Effort: Pulmonary effort is normal.  Abdominal:     Palpations: Abdomen is soft.  Musculoskeletal:        General: Tenderness present.     Comments: Midline low back lumbosacral spine tenderness.  Skin:    General: Skin is warm and dry.  Neurological:     Mental Status: She is alert and oriented to person, place, and time. Mental status is at baseline.    Results for orders placed or performed in visit on 08/06/23  POCT Urinalysis Dipstick (Automated)  Result Value Ref Range   Color, UA yellow    Clarity, UA clear    Glucose, UA Negative Negative   Bilirubin, UA neg    Ketones, UA neg    Spec Grav, UA 1.015 1.010 - 1.025   Blood, UA neg    pH, UA 6.0 5.0 - 8.0   Protein, UA Positive (A) Negative   Urobilinogen, UA 0.2 0.2 or 1.0 E.U./dL   Nitrite, UA neg    Leukocytes, UA Negative Negative  POCT urine pregnancy  Result Value Ref Range   Preg Test, Ur Positive (A) Negative      Assessment & Plan:  Positive pregnancy test -Positive at-home pregnancy test -Positive pregnancy test in clinic -Will obtain serum hCG -Discussed various options.  Schedule follow-up with OB/GYN -Start PNV -     hCG, quantitative, pregnancy  Missed period -     POCT  Urinalysis Dipstick (Automated) -     POCT urine pregnancy -     hCG, quantitative, pregnancy  Sacroiliac pain -POC UA negative for UTI. -Supportive care including Tylenol, heat, stretching   Return if symptoms worsen or fail to improve.   Deeann Saint, MD

## 2023-08-06 NOTE — Addendum Note (Signed)
Addended by: Clearnce Sorrel on: 08/06/2023 04:09 PM   Modules accepted: Orders

## 2023-08-07 ENCOUNTER — Encounter: Payer: Self-pay | Admitting: Family Medicine

## 2023-08-09 ENCOUNTER — Encounter: Payer: Self-pay | Admitting: Family Medicine

## 2023-08-11 NOTE — Telephone Encounter (Signed)
Number typically increase a certain amount each day in early pregnancy.

## 2023-08-20 ENCOUNTER — Encounter: Payer: No Typology Code available for payment source | Admitting: Family Medicine

## 2023-08-23 ENCOUNTER — Other Ambulatory Visit: Payer: No Typology Code available for payment source

## 2023-08-23 ENCOUNTER — Ambulatory Visit (INDEPENDENT_AMBULATORY_CARE_PROVIDER_SITE_OTHER): Payer: No Typology Code available for payment source | Admitting: Family Medicine

## 2023-08-23 ENCOUNTER — Encounter: Payer: Self-pay | Admitting: Family Medicine

## 2023-08-23 ENCOUNTER — Encounter: Payer: Self-pay | Admitting: Internal Medicine

## 2023-08-23 VITALS — BP 116/74 | HR 87 | Temp 98.1°F | Ht 63.0 in | Wt 319.8 lb

## 2023-08-23 DIAGNOSIS — Z0189 Encounter for other specified special examinations: Secondary | ICD-10-CM

## 2023-08-23 DIAGNOSIS — Z3A01 Less than 8 weeks gestation of pregnancy: Secondary | ICD-10-CM

## 2023-08-23 DIAGNOSIS — Z0000 Encounter for general adult medical examination without abnormal findings: Secondary | ICD-10-CM

## 2023-08-23 DIAGNOSIS — Z903 Acquired absence of stomach [part of]: Secondary | ICD-10-CM

## 2023-08-23 DIAGNOSIS — O99210 Obesity complicating pregnancy, unspecified trimester: Secondary | ICD-10-CM

## 2023-08-23 LAB — CBC WITH DIFFERENTIAL/PLATELET
Basophils Absolute: 0 10*3/uL (ref 0.0–0.1)
Basophils Relative: 0.4 % (ref 0.0–3.0)
Eosinophils Absolute: 0 10*3/uL (ref 0.0–0.7)
Eosinophils Relative: 0.5 % (ref 0.0–5.0)
HCT: 32.3 % — ABNORMAL LOW (ref 36.0–46.0)
Hemoglobin: 10.1 g/dL — ABNORMAL LOW (ref 12.0–15.0)
Lymphocytes Relative: 26.7 % (ref 12.0–46.0)
Lymphs Abs: 2.3 10*3/uL (ref 0.7–4.0)
MCHC: 31.2 g/dL (ref 30.0–36.0)
MCV: 79 fL (ref 78.0–100.0)
Monocytes Absolute: 0.3 10*3/uL (ref 0.1–1.0)
Monocytes Relative: 3 % (ref 3.0–12.0)
Neutro Abs: 5.9 10*3/uL (ref 1.4–7.7)
Neutrophils Relative %: 69.4 % (ref 43.0–77.0)
Platelets: 341 10*3/uL (ref 150.0–400.0)
RBC: 4.09 Mil/uL (ref 3.87–5.11)
RDW: 17.1 % — ABNORMAL HIGH (ref 11.5–15.5)
WBC: 8.5 10*3/uL (ref 4.0–10.5)

## 2023-08-23 LAB — LIPID PANEL
Cholesterol: 135 mg/dL (ref 0–200)
HDL: 47.8 mg/dL (ref >39.00)
LDL Cholesterol: 74 mg/dL (ref 0–99)
NonHDL: 87.53
Total CHOL/HDL Ratio: 3
Triglycerides: 66 mg/dL (ref 0.0–149.0)
VLDL: 13.2 mg/dL (ref 0.0–40.0)

## 2023-08-23 LAB — COMPREHENSIVE METABOLIC PANEL
ALT: 13 U/L (ref 0–35)
AST: 16 U/L (ref 0–37)
Albumin: 3.7 g/dL (ref 3.5–5.2)
Alkaline Phosphatase: 51 U/L (ref 39–117)
BUN: 9 mg/dL (ref 6–23)
CO2: 24 meq/L (ref 19–32)
Calcium: 8.9 mg/dL (ref 8.4–10.5)
Chloride: 103 meq/L (ref 96–112)
Creatinine, Ser: 0.91 mg/dL (ref 0.40–1.20)
GFR: 84.86 mL/min (ref >60.00)
Glucose, Bld: 129 mg/dL — ABNORMAL HIGH (ref 70–99)
Potassium: 3.8 meq/L (ref 3.5–5.1)
Sodium: 136 meq/L (ref 135–145)
Total Bilirubin: 0.3 mg/dL (ref 0.2–1.2)
Total Protein: 6.8 g/dL (ref 6.0–8.3)

## 2023-08-23 LAB — T4, FREE: Free T4: 0.9 ng/dL (ref 0.60–1.60)

## 2023-08-23 LAB — HEMOGLOBIN A1C: Hgb A1c MFr Bld: 6 % (ref 4.6–6.5)

## 2023-08-23 LAB — TSH: TSH: 7.22 u[IU]/mL — ABNORMAL HIGH (ref 0.35–5.50)

## 2023-08-23 NOTE — Progress Notes (Signed)
Established Patient Office Visit   Subjective  Patient ID: Christine Nelson, female    DOB: 02-Oct-1991  Age: 32 y.o. MRN: [id number]  Chief Complaint  Patient presents with   Annual Exam    Pt is a 32 yo G1P0 female with pmh sig for obesity s/p gastric sleeve, hepatic cysts seen for CPE and f/u.  Not fasting, had chicken minis from [employer].  Pt states she is still in shock about being pregnant. Took several more home tests after positive test in clinic and blood work.  Pt states she is feeling good, occasionally tired and with back pain.  No nausea.  Tired of eating chicken as limiting intake of fish and does not eat pork or beef.  Taking PNV.  Considering relocating to [place] area.    Patient Active Problem List   Diagnosis Date Noted   Chronic urticaria 07/16/2022   Mild intermittent asthma, uncomplicated 07/16/2022   Seasonal and perennial allergic rhinitis 07/16/2022   Gastro-esophageal reflux disease without esophagitis 04/03/2022   Status post gastric surgery 09/15/2021   Morbid obesity with BMI of 50.0-59.9, adult (HCC) 07/15/2021   Fibroid 08/24/2020   Iron deficiency anemia due to chronic blood loss 08/24/2020   Seasonal allergies 08/24/2020   Polycystic ovary syndrome 04/21/2019   History of hypothyroidism 03/15/2019   Class 3 severe obesity due to excess calories without serious comorbidity with body mass index (BMI) of 50.0 to 59.9 in adult [facility]) 03/15/2019   Prediabetes 02/04/2018   Hypothyroidism 02/04/2018   Excessive bleeding in premenopausal period 02/04/2018   Insulin resistance 09/22/2012   Past Medical History:  Diagnosis Date   Asthma    with allergy season   Fibroids    GERD (gastroesophageal reflux disease)    History of prediabetes    Hypothyroidism    Liver masses    followed by Sophronia Simas MD .   Obesity    PCOS (polycystic ovarian syndrome)    Uterine leiomyoma    Vitamin D deficiency    Past Surgical History:  Procedure  Laterality Date   BIOPSY  05/09/2021   Procedure: BIOPSY;  Surgeon: Quentin Ore, MD;  Location: [facility];  Service: General;;   ESOPHAGOGASTRODUODENOSCOPY N/A 05/09/2021   Procedure: ESOPHAGOGASTRODUODENOSCOPY (EGD) with biopsies;  Surgeon: Quentin Ore, MD;  Location: [facility];  Service: General;  Laterality: N/A;   LAPAROSCOPIC GASTRIC SLEEVE RESECTION  07/15/2021   LAPAROSCOPIC GELPORT ASSISTED MYOMECTOMY N/A 04/15/2022   Procedure: LAPAROSCOPIC GELPORT ASSISTED MYOMECTOMY;  Surgeon: Fermin Schwab, MD;  Location: [facility] OR;  Service: Gynecology;  Laterality: N/A;   UPPER GI ENDOSCOPY N/A 07/15/2021   Procedure: UPPER GI ENDOSCOPY;  Surgeon: Quentin Ore, MD;  Location: [facility];  Service: General;  Laterality: N/A;   WISDOM TOOTH EXTRACTION  2011   Social History   Tobacco Use   Smoking status: Never   Smokeless tobacco: Never  Vaping Use   Vaping status: Never Used  Substance Use Topics   Alcohol use: Never   Drug use: Never   Family History  Problem Relation Age of Onset   Hypertension Mother    Thyroid disease Father    Hypertension Maternal Grandfather    Stroke Maternal Grandfather    Colon cancer Neg Hx    Esophageal cancer Neg Hx    Rectal cancer Neg Hx    No Known Allergies    ROS Negative unless stated above    Objective:     BP 116/74 (  BP Location: Right Arm, Patient Position: Sitting, Cuff Size: Large)   Pulse 87   Temp 98.1 F (36.7 C) (Oral)   Ht 5\' 3"  (1.6 m)   Wt (!) 319 lb 12.8 oz (145.1 kg)   LMP 07/10/2023 (Approximate)   SpO2 97%   BMI 56.65 kg/m  BP Readings from Last 3 Encounters:  08/23/23 116/74  08/06/23 120/80  07/02/23 132/72   Wt Readings from Last 3 Encounters:  08/23/23 (!) 319 lb 12.8 oz (145.1 kg)  08/06/23 (!) 318 lb (144.2 kg)  07/02/23 (!) 321 lb 6.4 oz (145.8 kg)      Physical Exam Constitutional:      Appearance: Normal appearance.  HENT:     Head: Normocephalic and atraumatic.      Right Ear: Tympanic membrane, ear canal and external ear normal.     Left Ear: Tympanic membrane, ear canal and external ear normal.     Nose: Nose normal.     Mouth/Throat:     Mouth: Mucous membranes are moist.     Pharynx: No oropharyngeal exudate or posterior oropharyngeal erythema.  Eyes:     General: No scleral icterus.    Extraocular Movements: Extraocular movements intact.     Conjunctiva/sclera: Conjunctivae normal.     Pupils: Pupils are equal, round, and reactive to light.  Neck:     Thyroid: No thyromegaly.  Cardiovascular:     Rate and Rhythm: Normal rate and regular rhythm.     Pulses: Normal pulses.     Heart sounds: Normal heart sounds. No murmur heard.    No friction rub.  Pulmonary:     Effort: Pulmonary effort is normal.     Breath sounds: Normal breath sounds. No wheezing, rhonchi or rales.  Abdominal:     General: Bowel sounds are normal.     Palpations: Abdomen is soft.     Tenderness: There is no abdominal tenderness.  Musculoskeletal:        General: No deformity. Normal range of motion.  Lymphadenopathy:     Cervical: No cervical adenopathy.  Skin:    General: Skin is warm and dry.     Findings: No lesion.  Neurological:     General: No focal deficit present.     Mental Status: She is alert and oriented to person, place, and time.  Psychiatric:        Mood and Affect: Mood normal.        Thought Content: Thought content normal.     No results found for any visits on 08/23/23.    Assessment & Plan:  Well adult exam -Age-appropriate health screenings discussed -Obtain labs.  Patient is not fasting -Immunizations reviewed -Pap done 06/16/2022.  Due 2026. -next CPE in 1 yr -     CBC with Differential/Platelet -     TSH -     T4, free -     Lipid panel -     Comprehensive metabolic panel  Less than [redacted] weeks gestation of pregnancy -Complicated by h/o myomectomy, obesity s/p gastric sleeve. -LMP 07/10/2023.  EDD 04/15/24 -had a negative UhCG  at OB/Gyn 06/30/23. -serum quant hCG 1,664 on 08/06/23 -Continue PNV -OB intake appointment scheduled with OB/GYN. -     hCG, quantitative, pregnancy  Patient requested diagnostic testing -     hCG, quantitative, pregnancy  S/P gastric sleeve procedure -Discussed the importance of taking multivitamins daily. -     TSH -     T4, free -  Hemoglobin A1c -     Comprehensive metabolic panel   Return As needed.   Deeann Saint, MD

## 2023-08-24 LAB — HCG, QUANTITATIVE, PREGNANCY: HCG, Total, QN: 40985 m[IU]/mL

## 2023-09-06 ENCOUNTER — Other Ambulatory Visit: Payer: Self-pay | Admitting: Obstetrics and Gynecology

## 2023-09-06 DIAGNOSIS — D134 Benign neoplasm of liver: Secondary | ICD-10-CM

## 2023-09-06 DIAGNOSIS — D259 Leiomyoma of uterus, unspecified: Secondary | ICD-10-CM

## 2023-09-06 DIAGNOSIS — O368999 Maternal care for other specified fetal problems, unspecified trimester, other fetus: Secondary | ICD-10-CM

## 2023-09-06 DIAGNOSIS — E039 Hypothyroidism, unspecified: Secondary | ICD-10-CM

## 2023-09-06 DIAGNOSIS — N911 Secondary amenorrhea: Secondary | ICD-10-CM

## 2023-09-06 DIAGNOSIS — E669 Obesity, unspecified: Secondary | ICD-10-CM

## 2023-09-20 ENCOUNTER — Other Ambulatory Visit: Payer: Self-pay | Admitting: Family Medicine

## 2023-09-20 DIAGNOSIS — D134 Benign neoplasm of liver: Secondary | ICD-10-CM

## 2023-09-23 ENCOUNTER — Other Ambulatory Visit: Payer: Self-pay

## 2023-09-23 ENCOUNTER — Ambulatory Visit: Payer: No Typology Code available for payment source | Admitting: Allergy & Immunology

## 2023-09-23 ENCOUNTER — Encounter: Payer: Self-pay | Admitting: Allergy & Immunology

## 2023-09-23 ENCOUNTER — Other Ambulatory Visit: Payer: Self-pay | Admitting: Allergy & Immunology

## 2023-09-23 VITALS — BP 116/72 | HR 88 | Temp 98.0°F | Resp 16 | Ht 62.0 in | Wt 316.2 lb

## 2023-09-23 DIAGNOSIS — J4520 Mild intermittent asthma, uncomplicated: Secondary | ICD-10-CM

## 2023-09-23 DIAGNOSIS — L508 Other urticaria: Secondary | ICD-10-CM

## 2023-09-23 DIAGNOSIS — J3089 Other allergic rhinitis: Secondary | ICD-10-CM | POA: Diagnosis not present

## 2023-09-23 DIAGNOSIS — J302 Other seasonal allergic rhinitis: Secondary | ICD-10-CM

## 2023-09-23 MED ORDER — TRIAMCINOLONE ACETONIDE 0.1 % EX OINT: 1.0000 | TOPICAL_OINTMENT | Freq: Two times a day (BID) | CUTANEOUS | 1 refills | Status: DC

## 2023-09-23 NOTE — Progress Notes (Signed)
FOLLOW UP  Date of Service/Encounter:  09/23/23   Assessment:   Chronic urticaria   Mild intermittent asthma, uncomplicated - increasing treatment to a combined ICS/LABA   Perennial and seasonal allergic rhinitis (grasses, weeds, trees, outdoor molds, and cat)   Eczema as child   S/p bariatric surgery October 2022   Uterine fibroids - s/p surgery in July 2023   Interested in going to medical school    Certified doula   Plan/Recommendations:   1. Chronic urticaria - with elevated inflammatory markers - Cetirizine is indeed safe to use during pregnancy. - Add on triamcinolone ointment to use twice daily during flares.  - In the meantime, continue suppressive dosing of antihistamines:   - Morning: Zyrtec (cetirizine) 10mg    - Evening: Zyrtec (cetirizine) 10mg  IF NEEDED - You can change this dosing at home, decreasing the dose as needed or increasing the dosing as needed.   2. Mild intermittent asthma, uncomplicated - Lung testing not done since this you are pregnant (CONGRATULATIONS by the way!). - Continue with the seasonal use of the Breo.  - Daily controller medication(s): Breo on puff once daily during the worst seasons - Prior to physical activity: AirSupra 10-15 minutes before physical activity. - Rescue medications: AirSupra 4 puffs every 4-6 hours as needed - Asthma control goals:  * Full participation in all desired activities (may need albuterol before activity) * Albuterol use two time or less a week on average (not counting use with activity) * Cough interfering with sleep two time or less a month * Oral steroids no more than once a year * No hospitalizations  3. Chronic rhinitis - Previous testing showed: grasses, weeds, trees, outdoor molds, and cat - Continue taking: Zyrtec (cetirizine) 10mg  tablet 1-2 times daily  - Flonase is typically considered safe during pregnancy, but they might recommend Rhincort instead since this is the oldest steroid in the  nasal steroid class.  - We are be more aggressive if needed.   4. Return in about 3 months (around 12/22/2023). You can have the follow up appointment with Dr. Dellis Anes or a Nurse Practicioner (our Nurse Practitioners are excellent and always have Physician oversight!).     Subjective:   Christine Nelson is a 32 y.o. female presenting today for follow up of  Chief Complaint  Patient presents with   Asthma    6 mth f/u - Fine   Seasonal and Perennial Allergic Rhinitis     6 mth f/u - SoSo   Rash    X 5 dys - fine bumps -hands - bilateral, knees - bilateral; abdomen - middle; scabby like feeling on left buttock.    Christine Nelson has a history of the following: Patient Active Problem List   Diagnosis Date Noted   Chronic urticaria 07/16/2022   Mild intermittent asthma, uncomplicated 07/16/2022   Seasonal and perennial allergic rhinitis 07/16/2022   Gastro-esophageal reflux disease without esophagitis 04/03/2022   Status post gastric surgery 09/15/2021   Morbid obesity with BMI of 50.0-59.9, adult (HCC) 07/15/2021   Fibroid 08/24/2020   Iron deficiency anemia due to chronic blood loss 08/24/2020   Seasonal allergies 08/24/2020   Polycystic ovary syndrome 04/21/2019   History of hypothyroidism 03/15/2019   Class 3 severe obesity due to excess calories without serious comorbidity with body mass index (BMI) of 50.0 to 59.9 in adult [facility]) 03/15/2019   Prediabetes 02/04/2018   Hypothyroidism 02/04/2018   Excessive bleeding in premenopausal period 02/04/2018   Insulin resistance  09/22/2012    History obtained from: chart review and patient.  Dr. Velvet Bathe - OB/Gyn   Discussed the use of AI scribe software for clinical note transcription with the patient and/or guardian, who gave verbal consent to proceed.  Christine Nelson is a 32 y.o. female presenting for a follow up visit.  She was last seen in July 2024.  At that time, we worked on maximizing her postnasal drip with  montelukast.  We also added on Ryaltris to use 2 sprays per nostril at night.  We sent her to ENT.  She was continuing on [employer].  Since the last visit, she has largely done well.   Christine Nelson is currently pregnant with her first child. She reports a generally healthy pregnancy with no nausea or vomiting. She has been maintaining a healthy diet, consuming light meals such as yogurt for breakfast. She is going to be having a boy, although she is going to act surprised when the gender reveal comes along since she had genetic testing done on the fetus to make sure everything was OK.   Asthma/Respiratory Symptom History: She uses [employer] and has not needed it regularly. She does it on a regular basis. Christine Nelson's asthma has been well controlled. She has not required rescue medication, experienced nocturnal awakenings due to lower respiratory symptoms, nor have activities of daily living been limited. She has required no Emergency Department or Urgent Care visits for her asthma. She has required zero courses of systemic steroids for asthma exacerbations since the last visit. ACT score today is 25, indicating excellent asthma symptom control.   Allergic Rhinitis Symptom History: She reports increased mucus production in the mornings, which she finds bothersome. She has been using Ryaltris for this issue, which seems to help. She is not using her nose sprays regularly. She wants to check with her OB about safe medications for the baby. She is using the antihistamine. We did discuss Rhinocort versus other nasal sprays.   Skin Symptom History: Christine Nelson has been experiencing urticaria-like symptoms, with itchy rashes appearing on her hands, stomach, and buttocks. The patient has been managing the itchiness with hydrocortisone and oatmeal baths, but reports that the condition is quite uncomfortable. She has stopped taking her regular allergy and asthma medications due to concerns about her safety during  pregnancy.  The patient also mentions a backache that lasted for two weeks, which led to the discovery of her pregnancy. She initially thought the pain might be related to her kidneys, but her primary care doctor attributed it to the growing uterus. The patient is planning to move back to her hometown to be closer to family during the pregnancy. She is also considering furthering her education with a post-baccalaureate program, but is currently facing financial constraints.   Otherwise, there have been no changes to her past medical history, surgical history, family history, or social history.    Review of systems otherwise negative other than that mentioned in the HPI.    Objective:   Blood pressure 116/72, pulse 88, temperature 98 F (36.7 C), temperature source Temporal, resp. rate 16, height 5\' 2"  (1.575 m), weight (!) 316 lb 3.2 oz (143.4 kg), last menstrual period 07/10/2023, SpO2 100%. Body mass index is 57.83 kg/m.    Physical Exam Vitals reviewed.  Constitutional:      Appearance: She is well-developed and overweight.     Comments: Very talkative.   HENT:     Head: Normocephalic and atraumatic.     Right Ear: Tympanic  membrane, ear canal and external ear normal. No drainage, swelling or tenderness. Tympanic membrane is not injected, scarred, erythematous, retracted or bulging.     Left Ear: Tympanic membrane, ear canal and external ear normal. No drainage, swelling or tenderness. Tympanic membrane is not injected, scarred, erythematous, retracted or bulging.     Nose: Rhinorrhea present. No nasal deformity, septal deviation or mucosal edema.     Right Turbinates: Enlarged, swollen and pale.     Left Turbinates: Enlarged, swollen and pale.     Right Sinus: No maxillary sinus tenderness or frontal sinus tenderness.     Left Sinus: No maxillary sinus tenderness or frontal sinus tenderness.     Comments: No nasal polyps.  Clear rhinorrhea.    Mouth/Throat:     Lips: Pink.      Mouth: Mucous membranes are moist. Mucous membranes are not pale and not dry.     Pharynx: Uvula midline.     Comments: Moving air well in all lung fields. No increased work of breathing noted.  Eyes:     General: Allergic shiner present.        Right eye: No discharge.        Left eye: No discharge.     Conjunctiva/sclera: Conjunctivae normal.     Right eye: Right conjunctiva is not injected. No chemosis.    Left eye: Left conjunctiva is not injected. No chemosis.    Pupils: Pupils are equal, round, and reactive to light.  Cardiovascular:     Rate and Rhythm: Normal rate and regular rhythm.     Heart sounds: Normal heart sounds.  Pulmonary:     Effort: Pulmonary effort is normal. No tachypnea, accessory muscle usage or respiratory distress.     Breath sounds: Normal breath sounds. No wheezing, rhonchi or rales.     Comments: Moving air well in all lung fields. Chest:     Chest wall: No tenderness.  Abdominal:     Tenderness: There is no abdominal tenderness. There is no guarding or rebound.  Lymphadenopathy:     Head:     Right side of head: No submandibular, tonsillar or occipital adenopathy.     Left side of head: No submandibular, tonsillar or occipital adenopathy.     Cervical: No cervical adenopathy.  Skin:    General: Skin is warm.     Capillary Refill: Capillary refill takes less than 2 seconds.     Coloration: Skin is not pale.     Findings: No abrasion, erythema, petechiae or rash. Rash is not papular, urticarial or vesicular.     Comments: Tattoo on the upper right back. Otherwise, no rashes including urticaria noted. Some excoriations noted.   Neurological:     Mental Status: She is alert.  Psychiatric:        Behavior: Behavior is cooperative.      Diagnostic studies: none     Malachi Bonds, MD  [facility]

## 2023-09-23 NOTE — Patient Instructions (Addendum)
1. Chronic urticaria - with elevated inflammatory markers - Cetirizine is indeed safe to use during pregnancy. - Add on triamcinolone ointment to use twice daily during flares.  - In the meantime, continue suppressive dosing of antihistamines:   - Morning: Zyrtec (cetirizine) 10mg    - Evening: Zyrtec (cetirizine) 10mg  IF NEEDED - You can change this dosing at home, decreasing the dose as needed or increasing the dosing as needed.   2. Mild intermittent asthma, uncomplicated - Lung testing not done since this you are pregnant (CONGRATULATIONS by the way!). - Continue with the seasonal use of the Breo.   - Daily controller medication(s): Breo on puff once daily during the worst seasons - Prior to physical activity: AirSupra 10-15 minutes before physical activity. - Rescue medications: AirSupra 4 puffs every 4-6 hours as needed - Asthma control goals:  * Full participation in all desired activities (may need albuterol before activity) * Albuterol use two time or less a week on average (not counting use with activity) * Cough interfering with sleep two time or less a month * Oral steroids no more than once a year * No hospitalizations  3. Chronic rhinitis - Previous testing showed: grasses, weeds, trees, outdoor molds, and cat - Continue taking: Zyrtec (cetirizine) 10mg  tablet 1-2 times daily  - Flonase is typically considered safe during pregnancy, but they might recommend Rhincort instead since this is the oldest steroid in the nasal steroid class.  - We are be more aggressive if needed.   4. Return in about 3 months (around 12/22/2023). You can have the follow up appointment with Dr. Dellis Anes or a Nurse Practicioner (our Nurse Practitioners are excellent and always have Physician oversight!).    Please inform us of any Emergency Department visits, hospitalizations, or changes in symptoms. Call us before going to the ED for breathing or allergy symptoms since we might be able to fit you  in for a sick visit. Feel free to contact us anytime with any questions, problems, or concerns.  It was a pleasure to see you again today!  Websites that have reliable patient information: 1. American Academy of Asthma, Allergy, and Immunology: www.aaaai.org 2. Food Allergy Research and Education (FARE): foodallergy.org 3. Mothers of Asthmatics: http://www.asthmacommunitynetwork.org 4. American College of Allergy, Asthma, and Immunology: www.acaai.org      "Like" [place] on Facebook and Instagram for our latest updates!      A healthy democracy works best when [employer] participate! Make sure you are registered to vote! If you have moved or changed any of your contact information, you will need to get this updated before voting! Scan the QR codes below to learn more!

## 2023-09-24 NOTE — Telephone Encounter (Signed)
Is the fluticasone ointment ok in place of triamcinolone

## 2023-09-26 ENCOUNTER — Other Ambulatory Visit: Payer: Self-pay | Admitting: Family Medicine

## 2023-10-01 ENCOUNTER — Encounter: Payer: Self-pay | Admitting: *Deleted

## 2023-10-01 DIAGNOSIS — O359XX0 Maternal care for (suspected) fetal abnormality and damage, unspecified, not applicable or unspecified: Secondary | ICD-10-CM | POA: Insufficient documentation

## 2023-10-04 ENCOUNTER — Ambulatory Visit: Payer: No Typology Code available for payment source

## 2023-10-04 ENCOUNTER — Other Ambulatory Visit: Payer: No Typology Code available for payment source

## 2023-10-05 ENCOUNTER — Other Ambulatory Visit: Payer: Self-pay | Admitting: *Deleted

## 2023-10-05 ENCOUNTER — Ambulatory Visit: Payer: No Typology Code available for payment source | Attending: Obstetrics and Gynecology | Admitting: *Deleted

## 2023-10-05 ENCOUNTER — Encounter: Payer: Self-pay | Admitting: Family Medicine

## 2023-10-05 ENCOUNTER — Other Ambulatory Visit: Payer: Self-pay

## 2023-10-05 ENCOUNTER — Ambulatory Visit (HOSPITAL_BASED_OUTPATIENT_CLINIC_OR_DEPARTMENT_OTHER): Payer: No Typology Code available for payment source | Admitting: Obstetrics

## 2023-10-05 ENCOUNTER — Ambulatory Visit: Payer: No Typology Code available for payment source

## 2023-10-05 VITALS — BP 123/74 | HR 83

## 2023-10-05 DIAGNOSIS — E669 Obesity, unspecified: Secondary | ICD-10-CM

## 2023-10-05 DIAGNOSIS — O99211 Obesity complicating pregnancy, first trimester: Secondary | ICD-10-CM

## 2023-10-05 DIAGNOSIS — Z3A13 13 weeks gestation of pregnancy: Secondary | ICD-10-CM | POA: Insufficient documentation

## 2023-10-05 DIAGNOSIS — Z79890 Hormone replacement therapy (postmenopausal): Secondary | ICD-10-CM | POA: Diagnosis not present

## 2023-10-05 DIAGNOSIS — Z9884 Bariatric surgery status: Secondary | ICD-10-CM | POA: Diagnosis not present

## 2023-10-05 DIAGNOSIS — O26611 Liver and biliary tract disorders in pregnancy, first trimester: Secondary | ICD-10-CM | POA: Insufficient documentation

## 2023-10-05 DIAGNOSIS — Z363 Encounter for antenatal screening for malformations: Secondary | ICD-10-CM | POA: Insufficient documentation

## 2023-10-05 DIAGNOSIS — O26891 Other specified pregnancy related conditions, first trimester: Secondary | ICD-10-CM | POA: Insufficient documentation

## 2023-10-05 DIAGNOSIS — O99281 Endocrine, nutritional and metabolic diseases complicating pregnancy, first trimester: Secondary | ICD-10-CM

## 2023-10-05 DIAGNOSIS — O99810 Abnormal glucose complicating pregnancy: Secondary | ICD-10-CM

## 2023-10-05 DIAGNOSIS — D134 Benign neoplasm of liver: Secondary | ICD-10-CM

## 2023-10-05 DIAGNOSIS — O355XX0 Maternal care for (suspected) damage to fetus by drugs, not applicable or unspecified: Secondary | ICD-10-CM | POA: Diagnosis not present

## 2023-10-05 DIAGNOSIS — D259 Leiomyoma of uterus, unspecified: Secondary | ICD-10-CM

## 2023-10-05 DIAGNOSIS — O359XX0 Maternal care for (suspected) fetal abnormality and damage, unspecified, not applicable or unspecified: Secondary | ICD-10-CM

## 2023-10-05 DIAGNOSIS — O09891 Supervision of other high risk pregnancies, first trimester: Secondary | ICD-10-CM | POA: Diagnosis not present

## 2023-10-05 DIAGNOSIS — O368999 Maternal care for other specified fetal problems, unspecified trimester, other fetus: Secondary | ICD-10-CM

## 2023-10-05 DIAGNOSIS — N911 Secondary amenorrhea: Secondary | ICD-10-CM | POA: Diagnosis not present

## 2023-10-05 DIAGNOSIS — O99511 Diseases of the respiratory system complicating pregnancy, first trimester: Secondary | ICD-10-CM

## 2023-10-05 DIAGNOSIS — O3410 Maternal care for benign tumor of corpus uteri, unspecified trimester: Secondary | ICD-10-CM | POA: Diagnosis not present

## 2023-10-05 DIAGNOSIS — O368919 Maternal care for other specified fetal problems, first trimester, other fetus: Secondary | ICD-10-CM | POA: Insufficient documentation

## 2023-10-05 DIAGNOSIS — O99212 Obesity complicating pregnancy, second trimester: Secondary | ICD-10-CM

## 2023-10-05 DIAGNOSIS — O360110 Maternal care for anti-D [Rh] antibodies, first trimester, not applicable or unspecified: Secondary | ICD-10-CM | POA: Diagnosis not present

## 2023-10-05 DIAGNOSIS — E039 Hypothyroidism, unspecified: Secondary | ICD-10-CM

## 2023-10-05 DIAGNOSIS — O3411 Maternal care for benign tumor of corpus uteri, first trimester: Secondary | ICD-10-CM

## 2023-10-05 DIAGNOSIS — J45909 Unspecified asthma, uncomplicated: Secondary | ICD-10-CM

## 2023-10-06 NOTE — Progress Notes (Signed)
 MFM Note  Christine Nelson is currently at 13 weeks and 1 day.    She was seen in consultation due to the following issues that may affect her current pregnancy:  1.  Maternal obesity with a BMI of 58 2.  Prior myomectomy in 2023 3.  Gastric sleeve procedure 2 years ago.  She has lost 70 pounds since the surgery. 4.  Hypothyroidism treated with Synthroid  225 mcg daily 5.  Two liver adenomas.  She underwent embolization in March 2024.  She still has one adenoma left.  She is scheduled for an MRI this Sunday to assess size of the adenoma. 6.  Possible phentermine exposure earlier in her pregnancy.  The patient reports that she only took the medication for about 2 weeks in October 2024.  She had a cell free DNA test drawn earlier in her current pregnancy which indicated a low risk for trisomy 65, 61, and 13.  She did not want the fetal gender revealed.  On today's ultrasound exam, a viable singleton intrauterine gestation with a crown-rump length consistent with an [facility] of April 10, 2024 is noted.  There was normal amniotic fluid noted on today's exam.  The following were discussed during today's consultation:  Maternal obesity in pregnancy  Pregnancy in obese women may be at increased risk of preeclampsia and gestational diabetes requiring an indicated preterm birth.   The recommended total weight gain in pregnancy for obese women's between 10 to 20 pounds.  Due to obesity, an early screen for gestational diabetes at between 15 to 16 weeks is recommended.  If the initial early diabetes screening result is negative, a repeat diabetes screen is recommended again at 24 to 28 weeks.    As her prior gastric bypass surgery may prevent her from tolerating a large glucose load, it may be reasonable for her to do fingersticks for 1 to 2 weeks to screen for abnormal glucose levels.  Due to maternal obesity, a detailed fetal anatomy scan has been scheduled for her at around 19 weeks to screen for  fetal anomalies.    We will  continue to follow her with monthly growth ultrasounds.    Due to the increased risk of stillbirth in obese women with a BMI of greater than 40, weekly fetal testing should be started at 34 weeks.  As maternal obesity may present challenges associated with the management of anesthesia, an anesthesia consult should be obtained.  As African-American women with an elevated BMI may be at increased risk for developing preeclampsia, she will inquire with her bariatric surgeon if she can take a daily baby aspirin (81 mg daily) for preeclampsia prophylaxis.  Prior myomectomy and pregnancy   The patient should have a scheduled cesarean delivery at 37 weeks.  History of gastric sleeve and possible phentermine exposure earlier in her pregnancy  She was advised not to take phentermine during pregnancy.  Although data regarding phentermine exposure in pregnancy is limited, most reports have not indicated an association of increased birth defects.  We will assess for any signs of fetal abnormalities during her fetal anatomy scan at 19 weeks.    Due to her history of bariatric surgery, we will continue to follow her with growth ultrasounds throughout her pregnancy.  Hypothyroidism and pregnancy  She was advised to continue taking Synthroid  as prescribed.    She has a follow-up appointment with her endocrinologist later this week.    Her thyroid  function tests should be monitored at least once every trimester  and the dosage of her Synthroid  should be adjusted accordingly.  Hepatic adenoma in pregnancy  The potential for hepatic adenomas to increase in size during pregnancy and potentially rupture due to the hormonal effects associated with pregnancy was discussed.    She already has an MRI scheduled later this week to assess the size of the liver adenomas.    Imaging studies of the liver should be obtained should she complain of right upper quadrant pain.  We will  continue to follow her closely with you throughout her pregnancy.    She will return to our office for a detailed fetal anatomy scan at around 19 weeks.  The patient stated that all of her concerns and questions were addressed during today's consultation.  A total of 60 minutes was spent counseling and coordinating the care for this patient.  Greater than 50% of the time was spent in direct face-to-face contact.

## 2023-10-07 ENCOUNTER — Encounter: Payer: Self-pay | Admitting: Family Medicine

## 2023-10-08 ENCOUNTER — Encounter: Payer: Self-pay | Admitting: Internal Medicine

## 2023-10-08 ENCOUNTER — Telehealth (INDEPENDENT_AMBULATORY_CARE_PROVIDER_SITE_OTHER): Payer: Managed Care, Other (non HMO) | Admitting: Internal Medicine

## 2023-10-08 DIAGNOSIS — R7303 Prediabetes: Secondary | ICD-10-CM | POA: Diagnosis not present

## 2023-10-08 DIAGNOSIS — E039 Hypothyroidism, unspecified: Secondary | ICD-10-CM | POA: Diagnosis not present

## 2023-10-08 NOTE — Progress Notes (Addendum)
 Patient ID: Christine Nelson, female   DOB: [birth year], 32 y.o.   MRN: [id number]   Patient location: Home My location: Office Persons participating in the virtual visit: patient, provider  Referring Provider: Mercer Clotilda SAUNDERS, MD  I connected with the patient on 10/08/23 at  8:10 AM EST by a video enabled telemedicine application and verified that I am speaking with the correct person.   I discussed the limitations of evaluation and management by telemedicine and the availability of in person appointments. The patient expressed understanding and agreed to proceed.   Details of the encounter are shown below.  HPI  Christine Nelson is a 33 y.o. female, initially referred by her ObGyn , Dr. Delon A. Mock, returning for follow-up for hypothyroidism and prediabetes, with history of type 2 diabetes, controlled, non-insulin -dependent, without long-term complications.  Last appointment 6 months ago.  Interim history: She is currently pregnant -[redacted] weeks along.  She will be induced at week 37 middle of 03/2024 due to previous history of myomectomy. No nausea during the pregnancy and also no abdominal pain, headaches, back pain, increased urination.  Hypothyroidism  - dx'ed in 2013 - She was on levothyroxine  150 mcg in the past but stopped in 2018 >> restarted in 2020 at a lower dose  She is on levothyroxine  125 mcg 9 tablets weekly during pregnancy (dose increased 08/23/2023): - in am - fasting - at least 30 min from b'fast - + calcium - >4h later - no iron - + multivitamins- 4h later - + PPIs >4h later (prn Lansoprazole ) - not on Biotin  Reviewed her TFTs: Lab Results  Component Value Date   TSH 7.22 (H) 08/23/2023   TSH 1.44 04/02/2023   TSH 2.75 08/14/2022   TSH 0.90 10/01/2021   TSH 11.64 (H) 05/01/2021   TSH 5.46 (H) 08/16/2020   TSH 3.03 04/11/2020   TSH 13.68 (H) 03/15/2019   TSH 6.960 (H) 02/04/2018   Lab Results  Component Value Date   FREET4 0.90  08/23/2023   FREET4 0.98 04/02/2023   FREET4 1.23 10/01/2021   FREET4 1.2 08/16/2020   FREET4 1.15 04/11/2020   FREET4 0.69 03/15/2019   FREET4 1.09 02/04/2018   Lab Results  Component Value Date   T3FREE 2.7 02/04/2018  05/03/2015: TSH 0.743, free T4 1.68  She has + FH of thyroid  disorders in: Father. No FH of thyroid  cancer. No h/o radiation tx to head or neck. No herbal supplements. No Biotin use. No recent steroids use.   Prediabetes-history of type 2 diabetes: -Previously on Metformin -Diabetes resolved after gastric sleeve surgery in 07/2021.  Reviewed HbA1c level: Lab Results  Component Value Date   HGBA1C 6.0 08/23/2023   HGBA1C 5.7 04/02/2023   HGBA1C 5.3 10/01/2021   HGBA1C 6.2 (A) 07/02/2021   HGBA1C 7.0 (H) 05/01/2021   HGBA1C 6.2 (A) 11/21/2020   HGBA1C 6.5 04/11/2020   HGBA1C 6.8 (H) 03/15/2019   HGBA1C 5.5 02/04/2018   No CKD: Lab Results  Component Value Date   BUN 9 08/23/2023   BUN 12 09/07/2022   Lab Results  Component Value Date   CREATININE 0.91 08/23/2023   CREATININE 0.99 09/07/2022  No results found for: MICRALBCREAT  No HL: Lab Results  Component Value Date   CHOL 135 08/23/2023   HDL 47.80 08/23/2023   LDLCALC 74 08/23/2023   TRIG 66.0 08/23/2023   CHOLHDL 3 08/23/2023   She sees OB/GYN for PCOS, uterine fibroid (now on Lupron ).  Previously on OCPs  but wanted to come off OCPs as she felt that they are hindering her weight loss.  She presented to the emergency room 09/12/2021 with abdominal pain and was found to have a high lipase, 118, and also increased AST/ALT (167/100) and high alkaline phosphatase. She was found to have 2 masses on the liver on RUQ U/S -reviewing the report: Heterogeneous hepatic echotexture, favored to be secondary to multiple solid masses. Considerations include metastatic disease or hepatocellular carcinoma/carcinomas.  However, later investigation pointed towards hepatic adenomas. She had embolization. She was  supposed to have fibroids surgery, but this was postponed - back on Lupron . The fibroid decreased in size from 11 to 8 cm.  She finally had her surgery 04/16/2022.  ROS: + see HPI  I reviewed pt's medications, allergies, PMH, social hx, family hx, and changes were documented in the history of present illness. Otherwise, unchanged from my initial visit note.  Past Medical History:  Diagnosis Date   Anemia    Asthma    with allergy  season   Excessive bleeding in premenopausal period 02/04/2018   Fibroids    GERD (gastroesophageal reflux disease)    History of prediabetes    Hypothyroidism    Liver masses    followed by Leonor Dawn MD .   Obesity    PCOS (polycystic ovarian syndrome)    Seasonal and perennial allergic rhinitis 07/16/2022   Uterine leiomyoma    Vaginal Pap smear, abnormal    Vitamin D  deficiency    Past Surgical History:  Procedure Laterality Date   BIOPSY  05/09/2021   Procedure: BIOPSY;  Surgeon: Lyndel Deward PARAS, MD;  Location: [facility];  Service: General;;   ESOPHAGOGASTRODUODENOSCOPY N/A 05/09/2021   Procedure: ESOPHAGOGASTRODUODENOSCOPY (EGD) with biopsies;  Surgeon: Lyndel Deward PARAS, MD;  Location: [facility];  Service: General;  Laterality: N/A;   LAPAROSCOPIC GASTRIC SLEEVE RESECTION  07/15/2021   LAPAROSCOPIC GELPORT ASSISTED MYOMECTOMY N/A 04/15/2022   Procedure: LAPAROSCOPIC GELPORT ASSISTED MYOMECTOMY;  Surgeon: Yalcinkaya, Tamer, MD;  Location: [facility];  Service: Gynecology;  Laterality: N/A;   UPPER GI ENDOSCOPY N/A 07/15/2021   Procedure: UPPER GI ENDOSCOPY;  Surgeon: Lyndel Deward PARAS, MD;  Location: [facility];  Service: General;  Laterality: N/A;   WISDOM TOOTH EXTRACTION  2011   Social History   Socioeconomic History   Marital status: Divorced    Spouse name: Jerrell Finder   Number of children: 0   Years of education: Not on file   Highest education level: Master's degree (e.g., MA, MS, MEng, MEd, MSW, MBA)  Occupational  History   Occupation: student   Occupation: Administrative Asst for [employer]  Tobacco Use   Smoking status: Never    Passive exposure: Never   Smokeless tobacco: Never  Vaping Use   Vaping status: Never Used  Substance and Sexual Activity   Alcohol use: Never   Drug use: Never   Sexual activity: Yes    Partners: Male  Other Topics Concern   Not on file  Social History Narrative   Pt is single.  She has a boyfriend.  Pt graduated college with a degree in Biology.  Pt has always wanted to become a physician.  Pt was teaching Biology.  She is now working part-time at [employer].         Social Drivers of Corporate Investment Banker Strain: High Risk (08/03/2023)   Overall Financial Resource Strain (CARDIA)    Difficulty of Paying Living Expenses: Hard  Food Insecurity: Food  Insecurity Present (08/03/2023)   Hunger Vital Sign    Worried About Running Out of Food in the Last Year: Sometimes true    Ran Out of Food in the Last Year: Sometimes true  Transportation Needs: Unmet Transportation Needs (08/03/2023)   PRAPARE - Administrator, Civil Service (Medical): No    Lack of Transportation (Non-Medical): Yes  Physical Activity: Sufficiently Active (08/03/2023)   Exercise Vital Sign    Days of Exercise per Week: 3 days    Minutes of Exercise per Session: 60 min  Recent Concern: Physical Activity - Insufficiently Active (06/30/2023)   Exercise Vital Sign    Days of Exercise per Week: 2 days    Minutes of Exercise per Session: 60 min  Stress: Stress Concern Present (08/03/2023)   [employer] of Occupational Health - Occupational Stress Questionnaire    Feeling of Stress : To some extent  Social Connections: Moderately Integrated (08/03/2023)   Social Connection and Isolation Panel [NHANES]    Frequency of Communication with Friends and Family: Three times a week    Frequency of Social Gatherings with Friends and Family: Never    Attends Religious Services:  More than 4 times per year    Active Member of [employer] or Organizations: No    Attends Engineer, Structural: Not on file    Marital Status: Living with partner  Intimate Partner Violence: Not on file   Current Outpatient Medications on File Prior to Visit  Medication Sig Dispense Refill   fluticasone  (CUTIVATE ) 0.005 % ointment Apply topically 2 (two) times daily as needed. (Patient not taking: Reported on 10/05/2023) 60 g 1   lansoprazole  (PREVACID ) 30 MG capsule TAKE 1 CAPSULE BY MOUTH EVERY DAY 90 capsule 1   levothyroxine  (SYNTHROID ) 125 MCG tablet Take 1 tablet (125 mcg total) by mouth daily before breakfast. 90 tablet 3   Prenatal MV-Min-Fe Fum-FA-DHA (PRENATAL/FOLIC ACID +DHA) 27-0.8-200 MG CAPS Take 1 capsule by mouth daily at 12 noon.     No current facility-administered medications on file prior to visit.   No Known Allergies Family History  Problem Relation Age of Onset   Hypertension Mother    Thyroid  disease Father    Hypertension Maternal Grandfather    Stroke Maternal Grandfather    Colon cancer Neg Hx    Esophageal cancer Neg Hx    Rectal cancer Neg Hx    Asthma Neg Hx    Cancer Neg Hx    Diabetes Neg Hx   Pertinent family history: See HPI  PE: LMP 07/10/2023 (Approximate)  Wt Readings from Last 20 Encounters:  09/23/23 (!) 316 lb 3.2 oz (143.4 kg)  08/23/23 (!) 319 lb 12.8 oz (145.1 kg)  08/06/23 (!) 318 lb (144.2 kg)  07/02/23 (!) 321 lb 6.4 oz (145.8 kg)  04/02/23 (!) 321 lb 6.4 oz (145.8 kg)  12/31/22 295 lb (133.8 kg)  10/08/22 295 lb (133.8 kg)  09/10/22 295 lb (133.8 kg)  09/07/22 295 lb 6.7 oz (134 kg)  08/12/22 295 lb 8 oz (134 kg)  07/22/22 288 lb 9.6 oz (130.9 kg)  07/14/22 289 lb 9.6 oz (131.4 kg)  04/28/22 267 lb 8 oz (121.3 kg)  04/15/22 275 lb (124.7 kg)  04/13/22 276 lb 8 oz (125.4 kg)  04/09/22 277 lb 3.2 oz (125.7 kg)  02/27/22 267 lb (121.1 kg)  02/11/22 268 lb (121.6 kg)  12/31/21 276 lb 12.8 oz (125.6 kg)  12/10/21 273 lb  11.2 oz (124.1 kg)  Constitutional:  in NAD  The physical exam was not performed (virtual visit).  ASSESSMENT: 1. Hypothyroidism  2.  Prediabetes -History of type 2 diabetes, which resolved after sleeve gastrectomy in 2022, currently prediabetes  PLAN:  1. Patient with longstanding, uncontrolled, hypothyroidism, due to noncompliance with levothyroxine .  We moved the levothyroxine  dose on the nightstand as she was missing doses in the past.  I also advised her to take 2 tablets the next day if she forgets to take it. -TPO and ATA antibodies were not elevated to point towards Hashimoto's thyroiditis. - latest thyroid  labs reviewed with pt. >> TSH was elevated: Lab Results  Component Value Date   TSH 7.22 (H) 08/23/2023  - she continues on LT4 125 mcg - 9 tabs a week for the pregnancy, dose increased 08/23/2023 - pt feels good on this dose. - we discussed about taking the thyroid  hormone every day, with water , >30 minutes before breakfast, separated by >4 hours from acid reflux medications, calcium, iron, multivitamins. Pt. is taking it correctly. - will check thyroid  tests so that she can come back to the clinic: TSH and fT4 - If labs are abnormal, she will need to return for repeat TFTs in 1.5 months - I plan to see her back in 4 months, but sooner for labs  2.  Prediabetes -Controlled: HbA1c was 5.7% at last visit, and the latest value was 6.0% 2 months ago. -Her diabetes resolved after sleeve gastrectomy in 08/2021.  She lost 50 pounds in the 2 months after the surgery.  However, in the last 1.5 years, she gained approximately 50 pounds back -Last visit she was started on phentermine and was asking my opinion about Wegovy.  She had a high lipase in the past I advised her to be careful with GI symptoms if she decided to start it. -She is currently pregnant -She is not on diabetic medications now -Plan to check another HbA1c when she can come to the clinic.  We discussed about HbA1c  thresholds for prediabetes and diabetes.  Orders Placed This Encounter  Procedures   T4, free   TSH   Hemoglobin A1c   Lela Fendt, MD PhD [facility]

## 2023-10-08 NOTE — Patient Instructions (Addendum)
 Please come back for labs at your convenience.  Please continue Levothyroxine  125 mcg 9 tabs a week.  Take the thyroid  hormone EVERY DAY, with water , at least 30 minutes before breakfast, separated by at least 4 hours from: - acid reflux medications - calcium - iron - multivitamins  Please come back for a follow-up appointment in 4 months.

## 2023-10-10 ENCOUNTER — Ambulatory Visit
Admission: RE | Admit: 2023-10-10 | Discharge: 2023-10-10 | Disposition: A | Discharge status: Home / Self Care | Payer: Managed Care, Other (non HMO) | Source: Ambulatory Visit | Attending: Family Medicine | Admitting: Family Medicine

## 2023-10-10 DIAGNOSIS — D134 Benign neoplasm of liver: Secondary | ICD-10-CM

## 2023-11-17 ENCOUNTER — Ambulatory Visit: Payer: Managed Care, Other (non HMO) | Admitting: Family Medicine

## 2023-11-17 ENCOUNTER — Other Ambulatory Visit: Payer: Managed Care, Other (non HMO)

## 2023-11-17 ENCOUNTER — Encounter: Payer: Self-pay | Admitting: Family Medicine

## 2023-11-17 ENCOUNTER — Other Ambulatory Visit: Payer: Self-pay

## 2023-11-17 DIAGNOSIS — Z111 Encounter for screening for respiratory tuberculosis: Secondary | ICD-10-CM

## 2023-11-17 NOTE — Addendum Note (Signed)
Addended by: Philipp Deputy A on: 11/17/2023 03:21 PM   Modules accepted: Orders

## 2023-11-20 LAB — QUANTIFERON-TB GOLD PLUS
Mitogen-NIL: 6.96 [IU]/mL
NIL: 0.01 [IU]/mL
QuantiFERON-TB Gold Plus: NEGATIVE
TB1-NIL: 0.01 [IU]/mL
TB2-NIL: 0 [IU]/mL

## 2023-11-23 ENCOUNTER — Ambulatory Visit: Payer: Medicaid Other

## 2023-11-23 ENCOUNTER — Other Ambulatory Visit: Payer: Managed Care, Other (non HMO)

## 2023-11-26 ENCOUNTER — Encounter: Payer: Self-pay | Admitting: Family Medicine

## 2023-12-21 ENCOUNTER — Ambulatory Visit: Payer: No Typology Code available for payment source | Admitting: Allergy & Immunology

## 2024-01-10 ENCOUNTER — Encounter: Payer: Self-pay | Admitting: Internal Medicine

## 2024-04-13 ENCOUNTER — Other Ambulatory Visit: Payer: Self-pay | Admitting: Internal Medicine

## 2024-04-14 ENCOUNTER — Other Ambulatory Visit: Payer: Self-pay | Admitting: Family Medicine

## 2024-04-19 ENCOUNTER — Encounter: Payer: Self-pay | Admitting: Internal Medicine

## 2024-05-09 ENCOUNTER — Other Ambulatory Visit: Payer: Self-pay | Admitting: Family Medicine

## 2024-05-09 DIAGNOSIS — L309 Dermatitis, unspecified: Secondary | ICD-10-CM

## 2024-06-17 ENCOUNTER — Other Ambulatory Visit: Payer: Self-pay | Admitting: Internal Medicine

## 2024-06-19 NOTE — Telephone Encounter (Signed)
 Refill request complete

## 2024-10-19 ENCOUNTER — Other Ambulatory Visit: Payer: Self-pay | Admitting: Family Medicine
